# Patient Record
Sex: Female | Born: 1950 | Race: Black or African American | Hispanic: No | Marital: Married | Smoking: Former smoker
Health system: Southern US, Community
[De-identification: ages and names within clinical notes are randomized; demographics above are authoritative.]

## PROBLEM LIST (undated history)

## (undated) DIAGNOSIS — R011 Cardiac murmur, unspecified: Secondary | ICD-10-CM

## (undated) DIAGNOSIS — C189 Malignant neoplasm of colon, unspecified: Secondary | ICD-10-CM

## (undated) DIAGNOSIS — D649 Anemia, unspecified: Secondary | ICD-10-CM

## (undated) DIAGNOSIS — I10 Essential (primary) hypertension: Secondary | ICD-10-CM

## (undated) HISTORY — DX: Cardiac murmur, unspecified: R01.1

## (undated) HISTORY — DX: Anemia, unspecified: D64.9

## (undated) HISTORY — DX: Essential (primary) hypertension: I10

## (undated) HISTORY — PX: ABDOMINAL HYSTERECTOMY: SHX81

## (undated) HISTORY — DX: Malignant neoplasm of colon, unspecified: C18.9

---

## 2021-02-22 ENCOUNTER — Other Ambulatory Visit: Payer: Self-pay

## 2021-02-22 ENCOUNTER — Ambulatory Visit
Admission: RE | Admit: 2021-02-22 | Discharge: 2021-02-22 | Disposition: A | Discharge status: Home / Self Care | Payer: Self-pay | Source: Ambulatory Visit | Attending: Hematology | Admitting: Hematology

## 2021-02-22 ENCOUNTER — Telehealth: Payer: Self-pay | Admitting: Hematology

## 2021-02-22 DIAGNOSIS — C189 Malignant neoplasm of colon, unspecified: Secondary | ICD-10-CM

## 2021-02-22 NOTE — Progress Notes (Signed)
abstraction

## 2021-02-22 NOTE — Telephone Encounter (Signed)
Received a new pt referral from [facility]. Kari Patel has been cld and scheduled to see [name] on 7/20 at 3pm. Pt aware to arrive 20 minutes early.

## 2021-02-24 ENCOUNTER — Other Ambulatory Visit: Payer: Self-pay

## 2021-02-24 ENCOUNTER — Ambulatory Visit
Admission: RE | Admit: 2021-02-24 | Discharge: 2021-02-24 | Disposition: A | Discharge status: Home / Self Care | Payer: Self-pay | Source: Ambulatory Visit | Attending: Hematology | Admitting: Hematology

## 2021-02-24 DIAGNOSIS — C189 Malignant neoplasm of colon, unspecified: Secondary | ICD-10-CM

## 2021-02-24 NOTE — Progress Notes (Signed)
I spoke with radiology at [facility] requesting CT and U/S images be pushed to [employer].  They are able to push them.

## 2021-02-28 ENCOUNTER — Encounter: Payer: Self-pay | Admitting: Hematology

## 2021-03-01 ENCOUNTER — Encounter: Payer: Self-pay | Admitting: Hematology

## 2021-03-01 ENCOUNTER — Inpatient Hospital Stay: Payer: Medicare HMO | Attending: Hematology | Admitting: Hematology

## 2021-03-01 ENCOUNTER — Other Ambulatory Visit: Payer: Self-pay

## 2021-03-01 ENCOUNTER — Inpatient Hospital Stay: Payer: Medicare HMO

## 2021-03-01 VITALS — BP 164/66 | HR 90 | Temp 98.8°F | Resp 18 | Ht 63.5 in | Wt 161.0 lb

## 2021-03-01 DIAGNOSIS — D500 Iron deficiency anemia secondary to blood loss (chronic): Secondary | ICD-10-CM

## 2021-03-01 DIAGNOSIS — Z87891 Personal history of nicotine dependence: Secondary | ICD-10-CM | POA: Insufficient documentation

## 2021-03-01 DIAGNOSIS — R935 Abnormal findings on diagnostic imaging of other abdominal regions, including retroperitoneum: Secondary | ICD-10-CM | POA: Diagnosis not present

## 2021-03-01 DIAGNOSIS — C182 Malignant neoplasm of ascending colon: Secondary | ICD-10-CM | POA: Diagnosis not present

## 2021-03-01 DIAGNOSIS — D649 Anemia, unspecified: Secondary | ICD-10-CM | POA: Insufficient documentation

## 2021-03-01 LAB — CBC WITH DIFFERENTIAL (CANCER CENTER ONLY)
Abs Immature Granulocytes: 0.02 10*3/uL (ref 0.00–0.07)
Basophils Absolute: 0 10*3/uL (ref 0.0–0.1)
Basophils Relative: 1 %
Eosinophils Absolute: 0.2 10*3/uL (ref 0.0–0.5)
Eosinophils Relative: 3 %
HCT: 27.6 % — ABNORMAL LOW (ref 36.0–46.0)
Hemoglobin: 8.1 g/dL — ABNORMAL LOW (ref 12.0–15.0)
Immature Granulocytes: 0 %
Lymphocytes Relative: 16 %
Lymphs Abs: 1 10*3/uL (ref 0.7–4.0)
MCH: 21.5 pg — ABNORMAL LOW (ref 26.0–34.0)
MCHC: 29.3 g/dL — ABNORMAL LOW (ref 30.0–36.0)
MCV: 73.2 fL — ABNORMAL LOW (ref 80.0–100.0)
Monocytes Absolute: 0.5 10*3/uL (ref 0.1–1.0)
Monocytes Relative: 9 %
Neutro Abs: 4.4 10*3/uL (ref 1.7–7.7)
Neutrophils Relative %: 71 %
Platelet Count: 352 10*3/uL (ref 150–400)
RBC: 3.77 MIL/uL — ABNORMAL LOW (ref 3.87–5.11)
RDW: 18.2 % — ABNORMAL HIGH (ref 11.5–15.5)
WBC Count: 6.2 10*3/uL (ref 4.0–10.5)
nRBC: 0 % (ref 0.0–0.2)

## 2021-03-01 LAB — IRON AND TIBC
Iron: 11 ug/dL — ABNORMAL LOW (ref 28–170)
Saturation Ratios: 2 % — ABNORMAL LOW (ref 10.4–31.8)
TIBC: 479 ug/dL — ABNORMAL HIGH (ref 250–450)
UIBC: 468 ug/dL

## 2021-03-01 LAB — FERRITIN: Ferritin: 5 ng/mL — ABNORMAL LOW (ref 11–307)

## 2021-03-01 LAB — CMP (CANCER CENTER ONLY)
ALT: 12 U/L (ref 0–44)
AST: 19 U/L (ref 15–41)
Albumin: 3.9 g/dL (ref 3.5–5.0)
Alkaline Phosphatase: 82 U/L (ref 38–126)
Anion gap: 11 (ref 5–15)
BUN: 8 mg/dL (ref 8–23)
CO2: 23 mmol/L (ref 22–32)
Calcium: 9.4 mg/dL (ref 8.9–10.3)
Chloride: 103 mmol/L (ref 98–111)
Creatinine: 0.6 mg/dL (ref 0.44–1.00)
GFR, Estimated: >60 mL/min (ref >60)
Glucose, Bld: 103 mg/dL — ABNORMAL HIGH (ref 70–99)
Potassium: 3.9 mmol/L (ref 3.5–5.1)
Sodium: 137 mmol/L (ref 135–145)
Total Bilirubin: 0.3 mg/dL (ref 0.3–1.2)
Total Protein: 8.2 g/dL — ABNORMAL HIGH (ref 6.5–8.1)

## 2021-03-01 NOTE — Progress Notes (Signed)
START ON PATHWAY REGIMEN - Colorectal     A cycle is every 14 days:     Bevacizumab-xxxx      Oxaliplatin      Leucovorin      Fluorouracil      Fluorouracil   **Always confirm dose/schedule in your pharmacy ordering system**  Patient Characteristics: Distant Metastases, Nonsurgical Candidate, KRAS/NRAS Mutation Positive/Unknown (BRAF V600 Wild-Type/Unknown), Standard Cytotoxic Therapy, First Line Standard Cytotoxic Therapy, Bevacizumab Eligible, PS = 0,1 Tumor Location: Colon Therapeutic Status: Distant Metastases Microsatellite/Mismatch Repair Status: MSI-H/dMMR BRAF Mutation Status: Wild-Type (no mutation) KRAS/NRAS Mutation Status: Mutation Positive Standard Cytotoxic Line of Therapy: First Line Standard Cytotoxic Therapy ECOG Performance Status: 0 Bevacizumab Eligibility: Eligible Intent of Therapy: Non-Curative / Palliative Intent, Discussed with Patient

## 2021-03-01 NOTE — Progress Notes (Signed)
[facility]   [phone] [phone]) [place] Note   Patient Care Team: [name], MD as Consulting Physician (Oncology) [name], RN as Nurse Navigator (Oncology)  Date of Service:  03/01/2021   CHIEF COMPLAINTS/PURPOSE OF CONSULTATION: Metastatic colon cancer   REFERRING PHYSICIAN:  Self-referral   Oncology History Overview Note  Cancer Staging Cancer of right colon [facility]) Staging form: Colon and Rectum - Neuroendocine Tumors, AJCC 8th Edition - Clinical stage from 12/13/2020: Stage IV (cTX, cN1, cM1) - Signed by [name], MD on 03/01/2021    Cancer of right colon [facility])  12/07/2020 Procedure   Colonoscopy  Impression: - non-bleeding internal hemorrhoids - diverticulosis in entire examined colon - one 9 mm polyp in ascending colon removed with hot snare - one 13 mm polyp in transverse colon removed with hot fnare - likely malignant partially-obstructing tumor in cecum.   12/07/2020 Pathology Results   FINAL PATHOLOGIC DIAGNOSIS  SMALL BOWEL, BIOPSIES - duodenal mucosa within normal limits GASTRIC BODY AND ATRIUM, BIOPSY - gastric antral and body-type mucosa with moderate chronic Helicobacter pylori gastritis ASCENDING COLON, POLYPECTOMY - tubular adenoma with prominent eosinophilic infiltrate CECUM, "MASS," BIOPSIES - moderately differentiated adenocarcinoma TRANSVERSE COLON, POLYPECTOMY - high grade dysplasia involving tubulovillous adenoma - cauterized margins negative   12/13/2020 Cancer Staging   Staging form: Colon and Rectum - Neuroendocine Tumors, AJCC 8th Edition - Clinical stage from 12/13/2020: Stage IV (cTX, cN1, cM1) - Signed by [name], MD on 03/01/2021    12/22/2020 Imaging   CT CAP  IMPRESSION: Large 7.5 cm cecal mass with surrounding nodularity/lymphadenopathy 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 The largest measures 6 mm in the RUL. Findings are nonspecific but can be followed per Fleischner criteria Hiatal hernia   03/01/2021 Initial Diagnosis   Cancer of right colon ([place])       HISTORY OF PRESENTING ILLNESS:  Kari Patel 70 y.o. female is a here because of metastatic colon cancer. The patient was referred by herself. The patient presents to the clinic today alone.   She was initially referred to Dr. Coral Ceo of GI on 09/22/20 following hospitalization for anemia in 08/2020. She was also experiencing abdominal discomfort due to gas, for which her PCP prescribed omeprazole. The recommendation was to proceed with colonoscopy and EGD to rule out occult blood losses. This was performed on 12/07/20, and colonoscopy revealed a fungating and polypoid partially-obstructing large mass found in what is expected to be the cecum. Biopsy of the mass taken at that time confirmed adenocarcinoma. Of note, H. Pylori was also seen and was subsequently treated with Prevpac.   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  She was seen by general surgeon, surgery was not recommended due to the metastatic disease.  Chemotherapy and port placement was recommended by her surgeon, but she did not due to some acute illness around that time.  She has not seen her medical oncologist yet.  She lives in [place] with one of her son, but does have 2 sons in [place].  She decided  get her cancer treatment in [place], but the plan to stay both in [place] and [place].    Today the patient notes she feels well overall. She has mild abdominal bloating, but no pain, no nausea recently, BM normal. She has been taking iron pill some time which can cause constipation. Her nergy is normal. Taking care of her husband who has mild to moderate dementia.  She is a retired Network engineer.  She has a PMHx of HTN, heart murmur, and anemia. She is s/p hysterectomy.  REVIEW OF SYSTEMS:    Constitutional: Denies fevers, chills or abnormal night sweats, no recent weight loss  Eyes: Denies blurriness of vision, double vision or watery eyes Ears, nose, mouth, throat, and face: Denies mucositis or sore throat Respiratory: Denies cough, dyspnea or wheezes Cardiovascular: Denies palpitation, chest discomfort or lower extremity swelling Gastrointestinal: mild bloating. Denies nausea, heartburn or change in bowel habits Skin: Denies abnormal skin rashes Lymphatics: Denies new lymphadenopathy or easy bruising Neurological:Denies numbness, tingling or new weaknesses Behavioral/Psych: Mood is stable, no new changes  All other systems were reviewed with the patient and are negative.   MEDICAL HISTORY:  Past Medical History:  Diagnosis Date   Anemia    Colon cancer ([place])    cecum   Heart murmur    Hypertension     SURGICAL HISTORY: Past Surgical History:  Procedure Laterality Date   ABDOMINAL HYSTERECTOMY      SOCIAL HISTORY: Social History   Socioeconomic History   Marital status: Married    Spouse name: Not on file   Number of children: Not on file   Years of education: Not on file   Highest education level: Not on file  Occupational History   Not on file  Tobacco Use   Smoking status: Former    Packs/day: 0.50    Years: 5.00    Pack years: 2.50    Types: Cigarettes    Quit date: 08/13/1993    Years since quitting: 27.5   Smokeless tobacco: Never  Vaping Use   Vaping Use: Never used  Substance and Sexual Activity   Alcohol use: Not Currently    Comment:  social drinker   Drug use: Never   Sexual activity: Not on file  Other Topics Concern   Not on file  Social History Narrative   Not on file   Social Determinants of Health   Financial Resource Strain: Not on file  Food Insecurity: Not on file  Transportation Needs: Not on file  Physical Activity: Not on file  Stress: Not on file  Social Connections: Not on file  Intimate Partner Violence: Not on file    FAMILY HISTORY: Family History  Problem Relation Age of Onset   Stroke Mother    Hypertension Mother    Stroke Father    Hypertension Father     ALLERGIES:  is allergic to demerol [meperidine hcl].  MEDICATIONS:  Current Outpatient Medications  Medication Sig Dispense Refill   amLODipine (NORVASC) 10 MG tablet Take 10 mg by mouth daily.     ferrous sulfate 325 (65 FE) MG tablet Take 325 mg by mouth 2 (two) times daily with a meal.     lisinopril (ZESTRIL) 5 MG tablet Take 5 mg by mouth daily.     omeprazole (PRILOSEC) 20 MG capsule Take 20 mg by mouth daily.     vitamin C (ASCORBIC ACID) 500 MG tablet Take 500 mg by mouth daily.     No current facility-administered medications for  this visit.    PHYSICAL EXAMINATION: ECOG PERFORMANCE STATUS: 0 - Asymptomatic  Vitals:   03/01/21 1451  BP: (!) 164/66  Pulse: 90  Resp: 18  Temp: 98.8 F (37.1 C)  SpO2: 100%   Filed Weights   03/01/21 1451  Weight: 161 lb (73 kg)    GENERAL:alert, no distress and comfortable SKIN: skin color, texture, turgor are normal, no rashes or significant lesions EYES: normal, Conjunctiva are pink and non-injected, sclera clear NECK: supple, thyroid normal size, non-tender, without nodularity LYMPH:  no palpable lymphadenopathy in the cervical, axillary  LUNGS: clear to auscultation and percussion with normal breathing effort HEART: regular rate & rhythm and no murmurs and no lower extremity edema ABDOMEN:abdomen soft, non-tender and normal bowel sounds Musculoskeletal:no cyanosis  of digits and no clubbing  NEURO: alert & oriented x 3 with fluent speech, no focal motor/sensory deficits  LABORATORY DATA:  I have reviewed the data as listed CBC Latest Ref Rng & Units 03/01/2021  WBC 4.0 - 10.5 K/uL 6.2  Hemoglobin 12.0 - 15.0 g/dL 8.1(L)  Hematocrit 36.0 - 46.0 % 27.6(L)  Platelets 150 - 400 K/uL 352    CMP Latest Ref Rng & Units 03/01/2021  Glucose 70 - 99 mg/dL 103(H)  BUN 8 - 23 mg/dL 8  Creatinine 0.44 - 1.00 mg/dL 0.60  Sodium 135 - 145 mmol/L 137  Potassium 3.5 - 5.1 mmol/L 3.9  Chloride 98 - 111 mmol/L 103  CO2 22 - 32 mmol/L 23  Calcium 8.9 - 10.3 mg/dL 9.4  Total Protein 6.5 - 8.1 g/dL 8.2(H)  Total Bilirubin 0.3 - 1.2 mg/dL 0.3  Alkaline Phos 38 - 126 U/L 82  AST 15 - 41 U/L 19  ALT 0 - 44 U/L 12     RADIOGRAPHIC STUDIES: I have personally reviewed the radiological images as listed and agreed with the findings in the report. No results found.  ASSESSMENT & PLAN:  Kari Patel is a 70 y.o.  female with a history of HTN   Right colon Cancer with probable peritoneal and possible lung metastasis, G2, KRAS G13A mutation(+) -She was initially referred to GI for anemia. Work up with EGD and colonoscopy on 12/07/20 revealed a large cecal mass. Biopsy confirmed adenocarcinoma.  -I reviewed her outside medical records and discussed with patient, her CT scan images are being mailed to [place] but not available for me to review today -Since it has been 2 months from her last CT scan, I recommend repeating CT chest, abdomen pelvis with contrast (she declined oral contrast) in next 1-2 weeks to get a new baseline -I recommend omental biopsy to confirm metastasis -I will also do next generation sequencing such as Foundation One on her biopsy sample, to see if she is a candidate for immunotherapy or other targeted therapy.  She is not a candidate for EGFR inhibitor due to K-ras mutation. -I discussed the natural history of metastatic colon cancer, the incurable  nature of her disease, and the goal of therapy which is palliative to prolong her life and improve her quality of life. -We discussed the option of systemic therapy, such as FOLFOX, Capex, FOLFIRI, or FOLFOXFIRI.  She is 70 year old, but overall in good health, she is agreeable with port placement, will proceed first-line FOLFOX and bevacizumab  -plan to see her back after the scan and biopsy  -plan to start chemo in 2-3 weeks   2. Anemia -likely related to her colon cancer and iron deficiency -We will check iron study  to see if she needs IV iron   PLAN:  -CT chest, abdomen and pelvis with IV contrast, patient declined oral contrast -IR biopsy of omental nodule -IR port placement  -lab today -f/u and chemo class in 2 weeks, please to start chemo FOLFOX and Beva a few days after that    Orders Placed This Encounter  Procedures   CT CHEST ABDOMEN PELVIS W CONTRAST    Standing Status:   Future    Standing Expiration Date:   03/01/2022    Order Specific Question:   If indicated for the ordered procedure, I authorize the administration of contrast media per Radiology protocol    Answer:   Yes    Order Specific Question:   Preferred imaging location?    Answer:   [facility]    Order Specific Question:   Release to patient    Answer:   Immediate    Order Specific Question:   Is Oral Contrast requested for this exam?    Answer:   Yes, Per Radiology protocol    Order Specific Question:   Reason for Exam (SYMPTOM  OR DIAGNOSIS REQUIRED)    Answer:   evaluate cancer status, pt was diagnosed in May 2022, no treatment so far   IR IMAGING GUIDED PORT INSERTION    Standing Status:   Future    Standing Expiration Date:   03/01/2022    Order Specific Question:   Reason for Exam (SYMPTOM  OR DIAGNOSIS REQUIRED)    Answer:   chemo    Order Specific Question:   Preferred Imaging Location?    Answer:   [facility]   CT Biopsy    Standing Status:   Future    Standing Expiration  Date:   03/01/2022    Order Specific Question:   Lab orders requested (DO NOT place separate lab orders, these will be automatically ordered during procedure specimen collection):    Answer:   Surgical Pathology    Order Specific Question:   Reason for Exam (SYMPTOM  OR DIAGNOSIS REQUIRED)    Answer:   confirm metastatic disease    Order Specific Question:   Preferred location?    Answer:   [facility]   CBC with Differential (Cancer Center Only)    Standing Status:   Future    Number of Occurrences:   1    Standing Expiration Date:   03/01/2022   CMP ([place] only)    Standing Status:   Future    Number of Occurrences:   1    Standing Expiration Date:   03/01/2022   Iron and TIBC    Standing Status:   Future    Number of Occurrences:   1    Standing Expiration Date:   03/01/2022   CEA (IN HOUSE-[facility])FOR [facility] ONLY    Standing Status:   Future    Number of Occurrences:   1    Standing Expiration Date:   03/01/2022   Ferritin    Standing Status:   Future    Number of Occurrences:   1    Standing Expiration Date:   03/01/2022     All questions were answered. The patient knows to call the clinic with any problems, questions or concerns. The total time spent in the appointment was 60 minutes.     [name], MD 03/01/2021 6:14 PM  I, Wilburn Mylar, am acting as scribe for [name], MD.   I have  reviewed the above documentation for accuracy and completeness, and I agree with the above.

## 2021-03-01 NOTE — Progress Notes (Signed)
I met with Kari Patel after her consultation with Dr Burr Medico.  I explained my role as nurse navigator and provided my contact information.  I told her that I would call when her Ct scan has been scheduled. Her weight has been stable and she is not having difficulty.  I encouraged her to eat protein based foods as well as fruits and vegetables that she has been eating.  She deferred referral to nutrition at this time.  All questions answered.  Pt verbalized understanding.

## 2021-03-02 LAB — CEA (IN HOUSE-CHCC): CEA (CHCC-In House): 131.71 ng/mL — ABNORMAL HIGH (ref 0.00–5.00)

## 2021-03-03 ENCOUNTER — Encounter (HOSPITAL_COMMUNITY): Payer: Self-pay | Admitting: Radiology

## 2021-03-03 ENCOUNTER — Telehealth: Payer: Self-pay | Admitting: Hematology

## 2021-03-03 NOTE — Telephone Encounter (Signed)
Scheduled appointment per 07/22 sch msg. Patient is aware. 

## 2021-03-03 NOTE — Progress Notes (Signed)
Patient Demographics  Patient Name  Kari Patel, Kari Patel Legal Sex  Female DOB  September 04, 1950 SSN  [id number] Address  [place] [address] Phone  [phone] [facility])  [phone] (Mobile)     RE: CT Biopsy Received: Duaine Dredge, MD  Arlyn Leak   CT core bx omental mass  R/o met    DDH         Previous Messages    ----- Message -----  From: Garth Bigness D  Sent: 03/01/2021   4:26 PM EDT  To: Ir Procedure Requests  Subject: CT Biopsy                                       Procedure:  CT Biopsy   Reason:  Cancer of right colon, confirm metastatic disease   History:  outside imaging in PAC's   Provider:  [name]   Provider Contact:  [phone]

## 2021-03-06 ENCOUNTER — Other Ambulatory Visit: Payer: Self-pay | Admitting: Hematology

## 2021-03-06 ENCOUNTER — Telehealth: Payer: Self-pay | Admitting: Hematology

## 2021-03-06 DIAGNOSIS — D500 Iron deficiency anemia secondary to blood loss (chronic): Secondary | ICD-10-CM

## 2021-03-06 NOTE — Progress Notes (Signed)
I left a vm for Kari Patel letting her know that her iron level is low, we spoke about this when we meet after her consultation with Dr Burr Medico.  I let her know that Dr Burr Medico would like her to received IV iron.  I told her our schedulers will reach out to her.  I provided my direct phone number should she have any questions.

## 2021-03-06 NOTE — Telephone Encounter (Signed)
Scheduled appointment per 07/25 sch msg. Patient is aware. 

## 2021-03-08 ENCOUNTER — Other Ambulatory Visit: Payer: Self-pay

## 2021-03-08 NOTE — Progress Notes (Signed)
The proposed treatment discussed in conference is for discussion purpose only and is not a binding recommendation.  The patients have not been physically examined, or presented with their treatment options.  Therefore, final treatment plans cannot be decided.  

## 2021-03-09 ENCOUNTER — Other Ambulatory Visit: Payer: Self-pay | Admitting: Radiology

## 2021-03-09 NOTE — Progress Notes (Signed)
Pharmacist Chemotherapy Monitoring - Initial Assessment    Anticipated start date: 03/16/21   The following has been reviewed per standard work regarding the patient's treatment regimen: The patient's diagnosis, treatment plan and drug doses, and organ/hematologic function Lab orders and baseline tests specific to treatment regimen  The treatment plan start date, drug sequencing, and pre-medications Prior authorization status  Patient's documented medication list, including drug-drug interaction screen and prescriptions for anti-emetics and supportive care specific to the treatment regimen The drug concentrations, fluid compatibility, administration routes, and timing of the medications to be used The patient's access for treatment and lifetime cumulative dose history, if applicable  The patient's medication allergies and previous infusion related reactions, if applicable   Changes made to treatment plan:  N/A  Follow up needed:  prescriptions needed for anti-emetics, signing treatment plan, and no documented treatment access or plans for port-a-cath placement- port sched for 03/13/21.  Need to determine if Bevacizumab should be held on 03/16/21.   Tora Kindred, [facility], 03/09/2021  3:45 PM

## 2021-03-10 ENCOUNTER — Ambulatory Visit (HOSPITAL_COMMUNITY)
Admission: RE | Admit: 2021-03-10 | Discharge: 2021-03-10 | Disposition: A | Discharge status: Home / Self Care | Payer: Medicare HMO | Source: Ambulatory Visit | Attending: Hematology | Admitting: Hematology

## 2021-03-10 ENCOUNTER — Encounter (HOSPITAL_COMMUNITY): Payer: Self-pay

## 2021-03-10 ENCOUNTER — Other Ambulatory Visit: Payer: Self-pay | Admitting: Radiology

## 2021-03-10 ENCOUNTER — Other Ambulatory Visit: Payer: Self-pay

## 2021-03-10 DIAGNOSIS — C182 Malignant neoplasm of ascending colon: Secondary | ICD-10-CM | POA: Diagnosis not present

## 2021-03-10 MED ORDER — IOHEXOL 350 MG/ML SOLN
80.0000 mL | Freq: Once | INTRAVENOUS | Status: AC | PRN
  Administered 2021-03-10: 75 mL via INTRAVENOUS

## 2021-03-13 ENCOUNTER — Ambulatory Visit (HOSPITAL_COMMUNITY)
Admission: RE | Admit: 2021-03-13 | Discharge: 2021-03-13 | Disposition: A | Discharge status: Home / Self Care | Payer: Medicare HMO | Source: Ambulatory Visit | Attending: Hematology | Admitting: Hematology

## 2021-03-13 ENCOUNTER — Encounter (HOSPITAL_COMMUNITY): Payer: Self-pay

## 2021-03-13 ENCOUNTER — Other Ambulatory Visit: Payer: Self-pay

## 2021-03-13 ENCOUNTER — Inpatient Hospital Stay: Payer: Medicare HMO | Attending: Hematology

## 2021-03-13 VITALS — BP 161/67 | HR 75 | Temp 98.0°F | Resp 16

## 2021-03-13 DIAGNOSIS — N1330 Unspecified hydronephrosis: Secondary | ICD-10-CM | POA: Diagnosis not present

## 2021-03-13 DIAGNOSIS — D509 Iron deficiency anemia, unspecified: Secondary | ICD-10-CM | POA: Diagnosis not present

## 2021-03-13 DIAGNOSIS — K8020 Calculus of gallbladder without cholecystitis without obstruction: Secondary | ICD-10-CM | POA: Diagnosis not present

## 2021-03-13 DIAGNOSIS — C182 Malignant neoplasm of ascending colon: Secondary | ICD-10-CM

## 2021-03-13 DIAGNOSIS — Z885 Allergy status to narcotic agent status: Secondary | ICD-10-CM | POA: Insufficient documentation

## 2021-03-13 DIAGNOSIS — R1909 Other intra-abdominal and pelvic swelling, mass and lump: Secondary | ICD-10-CM | POA: Diagnosis present

## 2021-03-13 DIAGNOSIS — I700 Atherosclerosis of aorta: Secondary | ICD-10-CM | POA: Diagnosis not present

## 2021-03-13 DIAGNOSIS — Z5111 Encounter for antineoplastic chemotherapy: Secondary | ICD-10-CM | POA: Diagnosis present

## 2021-03-13 DIAGNOSIS — Z90710 Acquired absence of both cervix and uterus: Secondary | ICD-10-CM | POA: Insufficient documentation

## 2021-03-13 DIAGNOSIS — Z5112 Encounter for antineoplastic immunotherapy: Secondary | ICD-10-CM | POA: Diagnosis not present

## 2021-03-13 DIAGNOSIS — C786 Secondary malignant neoplasm of retroperitoneum and peritoneum: Secondary | ICD-10-CM | POA: Insufficient documentation

## 2021-03-13 DIAGNOSIS — Z79899 Other long term (current) drug therapy: Secondary | ICD-10-CM | POA: Insufficient documentation

## 2021-03-13 DIAGNOSIS — I10 Essential (primary) hypertension: Secondary | ICD-10-CM | POA: Insufficient documentation

## 2021-03-13 DIAGNOSIS — D500 Iron deficiency anemia secondary to blood loss (chronic): Secondary | ICD-10-CM

## 2021-03-13 DIAGNOSIS — Z87891 Personal history of nicotine dependence: Secondary | ICD-10-CM | POA: Insufficient documentation

## 2021-03-13 DIAGNOSIS — Z452 Encounter for adjustment and management of vascular access device: Secondary | ICD-10-CM | POA: Insufficient documentation

## 2021-03-13 HISTORY — PX: IR IMAGING GUIDED PORT INSERTION: IMG5740

## 2021-03-13 LAB — CBC
HCT: 28.7 % — ABNORMAL LOW (ref 36.0–46.0)
Hemoglobin: 7.9 g/dL — ABNORMAL LOW (ref 12.0–15.0)
MCH: 21 pg — ABNORMAL LOW (ref 26.0–34.0)
MCHC: 27.5 g/dL — ABNORMAL LOW (ref 30.0–36.0)
MCV: 76.1 fL — ABNORMAL LOW (ref 80.0–100.0)
Platelets: 386 10*3/uL (ref 150–400)
RBC: 3.77 MIL/uL — ABNORMAL LOW (ref 3.87–5.11)
RDW: 17.6 % — ABNORMAL HIGH (ref 11.5–15.5)
WBC: 5.2 10*3/uL (ref 4.0–10.5)
nRBC: 0 % (ref 0.0–0.2)

## 2021-03-13 LAB — PROTIME-INR
INR: 1.2 (ref 0.8–1.2)
Prothrombin Time: 15.1 seconds (ref 11.4–15.2)

## 2021-03-13 MED ORDER — FENTANYL CITRATE (PF) 100 MCG/2ML IJ SOLN
INTRAMUSCULAR | Status: AC | PRN
  Administered 2021-03-13: 25 ug via INTRAVENOUS

## 2021-03-13 MED ORDER — MIDAZOLAM HCL 2 MG/2ML IJ SOLN
INTRAMUSCULAR | Status: AC | PRN
  Administered 2021-03-13: 0.5 mg via INTRAVENOUS

## 2021-03-13 MED ORDER — LIDOCAINE HCL (PF) 1 % IJ SOLN
INTRAMUSCULAR | Status: AC | PRN
  Administered 2021-03-13: 10 mL

## 2021-03-13 MED ORDER — SODIUM CHLORIDE 0.9 % IV SOLN: INTRAVENOUS | Status: DC

## 2021-03-13 MED ORDER — SODIUM CHLORIDE 0.9 % IV SOLN
Freq: Once | INTRAVENOUS | Status: AC
  Filled 2021-03-13: qty 250

## 2021-03-13 MED ORDER — FENTANYL CITRATE (PF) 100 MCG/2ML IJ SOLN
INTRAMUSCULAR | Status: AC | PRN
  Administered 2021-03-13: 50 ug via INTRAVENOUS

## 2021-03-13 MED ORDER — LIDOCAINE HCL 1 % IJ SOLN
INTRAMUSCULAR | Status: AC
  Filled 2021-03-13: qty 10

## 2021-03-13 MED ORDER — MIDAZOLAM HCL 2 MG/2ML IJ SOLN
INTRAMUSCULAR | Status: AC
  Filled 2021-03-13: qty 2

## 2021-03-13 MED ORDER — FENTANYL CITRATE (PF) 100 MCG/2ML IJ SOLN
INTRAMUSCULAR | Status: AC
  Filled 2021-03-13: qty 2

## 2021-03-13 MED ORDER — MIDAZOLAM HCL 2 MG/2ML IJ SOLN
INTRAMUSCULAR | Status: AC | PRN
  Administered 2021-03-13: 1 mg via INTRAVENOUS

## 2021-03-13 MED ORDER — SODIUM CHLORIDE 0.9 % IV SOLN
200.0000 mg | Freq: Once | INTRAVENOUS | Status: AC
  Administered 2021-03-13: 200 mg via INTRAVENOUS
  Filled 2021-03-13: qty 200

## 2021-03-13 NOTE — Procedures (Addendum)
Interventional Radiology Procedure Note  Procedure: Placement of a right IJ approach single lumen PowerPort.  Tip is positioned at the superior cavoatrial junction and catheter is ready for immediate use.  Complications: None Recommendations:  - Ok to shower tomorrow - Do not submerge for 7 days - Routine line care  - Now to CT for biopsy.  Maintain NPO  Signed,  Dulcy Fanny. Earleen Newport, DO

## 2021-03-13 NOTE — H&P (Signed)
Chief Complaint: Patient was seen in consultation today for [facility] placement and omental mass biopsy at the request of Feng,Yan  Referring Physician(s): Feng,Yan  Supervising Physician: Corrie Mckusick  Patient Status: [facility] - Out-pt  History of Present Illness: Kari Patel is a 70 y.o. 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 The largest measures 6 mm in the RUL. Findings are nonspecific but can be followed per Fleischner criteria  Request made from Dr Burr Medico for biopsy of omental nodule and Port a cath placement Pt to start chemo treatment 03/15/21    Past Medical History:  Diagnosis Date   Anemia    Colon cancer ([place])    cecum   Heart murmur    Hypertension     Past Surgical History:  Procedure Laterality Date   ABDOMINAL HYSTERECTOMY      Allergies: Demerol [meperidine hcl]  Medications: Prior to Admission medications   Medication Sig Start Date End Date Taking? Authorizing Provider  amLODipine (NORVASC) 10 MG tablet Take 10 mg by mouth daily.    [provider]  lisinopril (ZESTRIL) 5 MG tablet Take 5 mg by mouth daily.    [provider]     Family History  Problem Relation Age of Onset   Stroke Mother    Hypertension Mother    Stroke Father    Hypertension Father     Social History   Socioeconomic History   Marital status: Married    Spouse name: Not on file   Number of children: Not on file   Years of education: Not on  file   Highest education level: Not on file  Occupational History   Not on file  Tobacco Use   Smoking status: Former    Packs/day: 0.50    Years: 5.00    Pack years: 2.50    Types: Cigarettes    Quit date: 08/13/1993    Years since quitting: 27.6   Smokeless tobacco: Never  Vaping Use   Vaping Use: Never used  Substance and Sexual Activity   Alcohol use: Not Currently    Comment: social drinker   Drug use: Never   Sexual activity: Not on file  Other Topics Concern   Not on file  Social History Narrative   Not on file   Social Determinants of Health   Financial Resource Strain: Not on file  Food Insecurity: Not on file  Transportation Needs: Not on file  Physical Activity: Not on file  Stress: Not on file  Social Connections: Not on file    Review of Systems: A 12 point ROS discussed and pertinent positives are indicated in the HPI above.  All other systems are negative.  Review of Systems  Constitutional:  Negative for activity change, fatigue and fever.  Respiratory:  Negative for cough and shortness of breath.   Cardiovascular:  Negative for chest pain.  Gastrointestinal:  Positive for abdominal pain.  Neurological:  Negative for weakness.  Psychiatric/Behavioral:  Negative for behavioral problems and confusion.    Vital Signs: There were no vitals taken for this visit.  Physical Exam HENT:     Mouth/Throat:  Mouth: Mucous membranes are moist.  Cardiovascular:     Rate and Rhythm: Normal rate and regular rhythm.     Heart sounds: Normal heart sounds.  Pulmonary:     Breath sounds: Normal breath sounds.  Abdominal:     Palpations: Abdomen is soft.     Tenderness: There is no abdominal tenderness.  Musculoskeletal:        General: Normal range of motion.  Skin:    General: Skin is warm.  Neurological:     Mental Status: She is alert and oriented to person, place, and time.  Psychiatric:        Behavior: Behavior normal.    Imaging: CT CHEST  ABDOMEN PELVIS W CONTRAST  Result Date: 03/13/2021 CLINICAL DATA:  Colorectal cancer staging, diagnosed May 2022 EXAM: CT CHEST, ABDOMEN, AND PELVIS WITH CONTRAST TECHNIQUE: Multidetector CT imaging of the chest, abdomen and pelvis was performed following the standard protocol during bolus administration of intravenous contrast. CONTRAST:  53m OMNIPAQUE IOHEXOL 350 MG/ML SOLN COMPARISON:  Outside CT Dec 22, 2020. FINDINGS: CT CHEST FINDINGS Cardiovascular: Aortic atherosclerosis without aneurysmal dilation. No central pulmonary embolus. Normal size heart. Coronary artery calcifications. No significant pericardial effusion/thickening. Mediastinum/Nodes: No supraclavicular adenopathy. Hypodense 1 cm nodule in the left lobe of thyroid. Not clinically significant; no follow-up imaging recommended (ref: J Am Coll Radiol. 2015 Feb;12(2): 143-50).Prominent mediastinal and axillary lymph nodes without adenopathy by size criteria. The trachea and esophagus are grossly unremarkable. Small hiatal hernia. Lungs/Pleura: No significant interval change in size or number of the numerous scattered bilateral subcentimeter pulmonary nodules. For reference: -solid 6 mm pulmonary nodule in the right apex on image 30/6, unchanged. -solid 5 mm pulmonary nodule in the paramedian anterior right upper lobe on image 66/6, unchanged -solid 5 mm pulmonary nodule in the right lower lobe on image 76/6, unchanged. -solid 6 mm left lower lobe pulmonary nodule on image 96/6, previously measuring 5 mm. No pleural effusion.  No pneumothorax. Musculoskeletal: Spondylosis. No aggressive lytic or blastic lesion of bone. CT ABDOMEN PELVIS FINDINGS Hepatobiliary: Borderline hepatomegaly measuring 17.4 cm in craniocaudal dimension. The no suspicious hepatic lesion. Layering biliary sludge and stones in the gallbladder, no evidence of acute cholecystitis. No biliary ductal dilation. Pancreas: Within normal limits. Spleen: Within normal limits.  Adrenals/Urinary Tract: Bilateral adrenal glands are unremarkable. Similar mild right-sided hydronephrosis without hydroureter, with the distal ureter traversing peritoneal nodularity on image 92/2. No left-sided hydronephrosis. No solid enhancing renal masses. Urinary bladder is decompressed limiting evaluation. Stomach/Bowel: Small hiatal hernia, otherwise the stomach is grossly unremarkable. No pathologic dilation of small bowel. Extensive colonic diverticulosis without findings of acute diverticulitis. Large infiltrative cecal mass measuring approximately 7.1 x 6.8 x 4.3 cm on image 96/2 and 71/4, with luminal narrowing and involvement of the terminal ileum. Vascular/Lymphatic: Aortic atherosclerosis without aneurysmal dilation. Similar prominent and mildly enlarged retroperitoneal and mesenteric lymph nodes. For reference right common iliac lymph node measuring 12 mm in short axis on image 91/2 and a right pelvic sidewall lymph node measuring 7 mm on image 98/2. Reproductive: Status post hysterectomy. No adnexal masses. Other: Similar extensive peritoneal and omental nodularity throughout the abdomen and pelvis with trace free fluid in the pelvis along the pericolic gutters. Musculoskeletal: Multilevel degenerative changes spine. Degenerative change of the right greater than left hips. No aggressive lytic or blastic lesion of bone visualized. IMPRESSION: 1. Large infiltrative cecal mass measuring approximately 7.1 cm, reflecting the known right-sided colonic neoplasm. 2. Similar extensive peritoneal/omental nodularity throughout the abdomen  and pelvis with trace free fluid in the pelvis along the pericolic gutters, consistent with disease involvement. 3. Similar prominent and mildly enlarged retroperitoneal and mesenteric lymph nodes, most likely reflecting disease involvement. 4. Numerous scattered bilateral pulmonary nodules measuring up to 6 mm are similar to prior and nonspecific but suspicious for disease  involvement. Attention on follow-up imaging suggested. 5. Similar mild right-sided hydronephrosis without hydroureter, with the distal ureter traversing peritoneal nodularity. Likely reflecting partial UPJ obstruction given lack of hydroureter. Attention on follow-up imaging suggested. Cholelithiasis without findings of acute cholecystitis. 6.  Aortic Atherosclerosis (ICD10-I70.0). Electronically Signed   By: Dahlia Bailiff MD   On: 03/13/2021 08:46    Labs:  CBC: Recent Labs    03/01/21 1551  WBC 6.2  HGB 8.1*  HCT 27.6*  PLT 352    COAGS: No results for input(s): INR, APTT in the last 8760 hours.  BMP: Recent Labs    03/01/21 1551  NA 137  K 3.9  CL 103  CO2 23  GLUCOSE 103*  BUN 8  CALCIUM 9.4  CREATININE 0.60  GFRNONAA >60    LIVER FUNCTION TESTS: Recent Labs    03/01/21 1551  BILITOT 0.3  AST 19  ALT 12  ALKPHOS 82  PROT 8.2*  ALBUMIN 3.9    TUMOR MARKERS: No results for input(s): AFPTM, CEA, CA199, CHROMGRNA in the last 8760 hours.  Assessment and Plan:  New dx colorectal cancer Imaging revealing cecal mass; omental nodules; pulmonary nodules Scheduled for omental nodule biopsy and Port a cath placement. Risks and benefits of omental nodule biopsy was discussed with the patient and/or patient's family including, but not limited to bleeding, infection, damage to adjacent structures or low yield requiring additional tests.  Risks and benefits of image guided port-a-catheter placement was discussed with the patient including, but not limited to bleeding, infection, pneumothorax, or fibrin sheath development and need for additional procedures.  All of the patient's questions were answered, patient is agreeable to proceed. Consent signed and in chart.    Thank you for this interesting consult.  I greatly enjoyed meeting Kari Patel and look forward to participating in their care.  A copy of this report was sent to the requesting provider on this  date.  Electronically Signed: Lavonia Drafts, PA-C 03/13/2021, 9:19 AM   I spent a total of  30 Minutes   in face to face in clinical consultation, greater than 50% of which was counseling/coordinating care for [facility] placement and omental nodule biopsy.

## 2021-03-13 NOTE — Patient Instructions (Signed)

## 2021-03-13 NOTE — Procedures (Signed)
Interventional Radiology Procedure Note  Procedure: CT guided biopsy of omental mass, RLQ Complications: None EBL: None Recommendations: - Bedrest 1 hours.   - Routine wound care - Follow up pathology - Advance diet   Signed,  Corrie Mckusick, DO

## 2021-03-14 ENCOUNTER — Other Ambulatory Visit: Payer: Self-pay

## 2021-03-14 DIAGNOSIS — C189 Malignant neoplasm of colon, unspecified: Secondary | ICD-10-CM

## 2021-03-14 DIAGNOSIS — Z95828 Presence of other vascular implants and grafts: Secondary | ICD-10-CM

## 2021-03-14 MED ORDER — PROCHLORPERAZINE MALEATE 10 MG PO TABS
10.0000 mg | ORAL_TABLET | Freq: Four times a day (QID) | ORAL | 1 refills | Status: DC | PRN
Start: 2021-03-14 — End: 2021-12-12

## 2021-03-14 MED ORDER — LIDOCAINE-PRILOCAINE 2.5-2.5 % EX CREA: 1.0000 "application " | TOPICAL_CREAM | CUTANEOUS | 0 refills | Status: DC | PRN

## 2021-03-14 MED ORDER — ONDANSETRON HCL 8 MG PO TABS
8.0000 mg | ORAL_TABLET | Freq: Three times a day (TID) | ORAL | 1 refills | Status: DC | PRN
Start: 2021-03-14 — End: 2021-11-24

## 2021-03-15 ENCOUNTER — Other Ambulatory Visit: Payer: Self-pay

## 2021-03-15 ENCOUNTER — Encounter: Payer: Self-pay | Admitting: Hematology

## 2021-03-15 ENCOUNTER — Inpatient Hospital Stay: Payer: Medicare HMO

## 2021-03-15 ENCOUNTER — Inpatient Hospital Stay (HOSPITAL_BASED_OUTPATIENT_CLINIC_OR_DEPARTMENT_OTHER): Payer: Medicare HMO | Admitting: Hematology

## 2021-03-15 VITALS — BP 192/77 | HR 77 | Temp 98.2°F | Resp 18 | Ht 63.0 in | Wt 160.9 lb

## 2021-03-15 DIAGNOSIS — Z95828 Presence of other vascular implants and grafts: Secondary | ICD-10-CM

## 2021-03-15 DIAGNOSIS — C182 Malignant neoplasm of ascending colon: Secondary | ICD-10-CM

## 2021-03-15 DIAGNOSIS — Z5112 Encounter for antineoplastic immunotherapy: Secondary | ICD-10-CM | POA: Diagnosis not present

## 2021-03-15 LAB — CMP (CANCER CENTER ONLY)
ALT: 7 U/L (ref 0–44)
AST: 15 U/L (ref 15–41)
Albumin: 3.3 g/dL — ABNORMAL LOW (ref 3.5–5.0)
Alkaline Phosphatase: 74 U/L (ref 38–126)
Anion gap: 9 (ref 5–15)
BUN: 6 mg/dL — ABNORMAL LOW (ref 8–23)
CO2: 23 mmol/L (ref 22–32)
Calcium: 9.5 mg/dL (ref 8.9–10.3)
Chloride: 108 mmol/L (ref 98–111)
Creatinine: 0.63 mg/dL (ref 0.44–1.00)
GFR, Estimated: >60 mL/min (ref >60)
Glucose, Bld: 89 mg/dL (ref 70–99)
Potassium: 3.9 mmol/L (ref 3.5–5.1)
Sodium: 140 mmol/L (ref 135–145)
Total Bilirubin: <0.2 mg/dL — ABNORMAL LOW (ref 0.3–1.2)
Total Protein: 7.5 g/dL (ref 6.5–8.1)

## 2021-03-15 LAB — TOTAL PROTEIN, URINE DIPSTICK: Protein, ur: <30 mg/dL — AB

## 2021-03-15 LAB — CBC WITH DIFFERENTIAL (CANCER CENTER ONLY)
Abs Immature Granulocytes: 0.01 10*3/uL (ref 0.00–0.07)
Basophils Absolute: 0 10*3/uL (ref 0.0–0.1)
Basophils Relative: 1 %
Eosinophils Absolute: 0.2 10*3/uL (ref 0.0–0.5)
Eosinophils Relative: 4 %
HCT: 24 % — ABNORMAL LOW (ref 36.0–46.0)
Hemoglobin: 7.1 g/dL — ABNORMAL LOW (ref 12.0–15.0)
Immature Granulocytes: 0 %
Lymphocytes Relative: 19 %
Lymphs Abs: 1 10*3/uL (ref 0.7–4.0)
MCH: 21.7 pg — ABNORMAL LOW (ref 26.0–34.0)
MCHC: 29.6 g/dL — ABNORMAL LOW (ref 30.0–36.0)
MCV: 73.4 fL — ABNORMAL LOW (ref 80.0–100.0)
Monocytes Absolute: 0.6 10*3/uL (ref 0.1–1.0)
Monocytes Relative: 11 %
Neutro Abs: 3.5 10*3/uL (ref 1.7–7.7)
Neutrophils Relative %: 65 %
Platelet Count: 325 10*3/uL (ref 150–400)
RBC: 3.27 MIL/uL — ABNORMAL LOW (ref 3.87–5.11)
RDW: 17.6 % — ABNORMAL HIGH (ref 11.5–15.5)
WBC Count: 5.3 10*3/uL (ref 4.0–10.5)
nRBC: 0 % (ref 0.0–0.2)

## 2021-03-15 MED ORDER — SODIUM CHLORIDE 0.9% FLUSH
10.0000 mL | INTRAVENOUS | Status: AC | PRN
  Administered 2021-03-15: 10 mL
  Filled 2021-03-15: qty 10

## 2021-03-15 MED ORDER — HEPARIN SOD (PORK) LOCK FLUSH 100 UNIT/ML IV SOLN
500.0000 [IU] | INTRAVENOUS | Status: AC | PRN
  Administered 2021-03-15: 500 [IU]
  Filled 2021-03-15: qty 5

## 2021-03-15 NOTE — Progress Notes (Signed)
[facility]   Telephone:([phone]) [phone] Fax:(336) [phone]   Clinic Follow up Note   Patient Care Team: Pcp, No as PCP - General [name], MD as Consulting Physician (Oncology) [name], RN as Nurse Navigator (Oncology)  Date of Service:  03/15/2021  CHIEF COMPLAINT: f/u of metastatic colon cancer  SUMMARY OF ONCOLOGIC HISTORY: Oncology History Overview Note  Cancer Staging Cancer of right colon [facility]) Staging form: Colon and Rectum - Neuroendocine Tumors, AJCC 8th Edition - Clinical stage from 12/13/2020: Stage IV (cTX, cN1, cM1) - Signed by [name], MD on 03/01/2021    Cancer of right colon [facility])  12/07/2020 Procedure   Colonoscopy  Impression: - non-bleeding internal hemorrhoids - diverticulosis in entire examined colon - one 9 mm polyp in ascending colon removed with hot snare - one 13 mm polyp in transverse colon removed with hot fnare - likely malignant partially-obstructing tumor in cecum.   12/07/2020 Pathology Results   FINAL PATHOLOGIC DIAGNOSIS  SMALL BOWEL, BIOPSIES - duodenal mucosa within normal limits GASTRIC BODY AND ATRIUM, BIOPSY - gastric antral and body-type mucosa with moderate chronic Helicobacter pylori gastritis ASCENDING COLON, POLYPECTOMY - tubular adenoma with prominent eosinophilic infiltrate CECUM, "MASS," BIOPSIES - moderately differentiated adenocarcinoma TRANSVERSE COLON, POLYPECTOMY - high grade dysplasia involving tubulovillous adenoma - cauterized margins negative   12/13/2020 Cancer Staging   Staging form: Colon and Rectum - Neuroendocine Tumors, AJCC 8th Edition - Clinical stage from 12/13/2020: Stage IV (cTX, cN1, cM1) - Signed by [name], MD on 03/01/2021    12/22/2020 Imaging   CT CAP  IMPRESSION: Large 7.5 cm cecal mass with surrounding nodularity/lymphadenopathy 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 The largest measures 6 mm in the RUL. Findings are nonspecific but can be followed per Fleischner criteria Hiatal hernia   03/01/2021 Initial Diagnosis   Cancer of right colon ([place])    03/10/2021 Imaging   CT CAP  IMPRESSION: 1. Large infiltrative cecal mass measuring approximately 7.1 cm, reflecting the known right-sided colonic neoplasm. 2. Similar extensive peritoneal/omental nodularity throughout the abdomen and pelvis with trace free fluid in the pelvis along the pericolic gutters, consistent with disease involvement. 3. Similar prominent and mildly enlarged retroperitoneal and mesenteric lymph nodes, most likely reflecting disease involvement. 4. Numerous scattered bilateral pulmonary nodules measuring up to 6 mm are similar to prior and nonspecific but suspicious for disease involvement. Attention on follow-up imaging suggested. 5. Similar mild right-sided hydronephrosis without hydroureter, with the distal ureter traversing peritoneal nodularity. Likely reflecting partial UPJ obstruction given lack of hydroureter. Attention on follow-up imaging suggested. Cholelithiasis without findings of acute cholecystitis. 6.  Aortic Atherosclerosis (ICD10-I70.0).   03/13/2021 Pathology Results   FINAL MICROSCOPIC DIAGNOSIS:   A. OMENTUM, NEEDLE CORE BIOPSY:  - Metastatic adenocarcinoma, consistent with clinical impression of a  colonic primary.  See comment    03/16/2021 -  Chemotherapy    Patient is on Treatment Plan: COLORECTAL FOLFOX + BEVACIZUMAB Q14D          CURRENT THERAPY:  First-line FOLFOX and bevacizumab, starting 03/16/21  INTERVAL HISTORY:  Kari Patel is here for a follow up of metastatic colon cancer. She was last seen by me on 03/01/21 in consultation. She presents to the  clinic alone. She reports she had some pain from biopsy but not  much. Her BP is high today. She notes she forgot to take her medication this morning. She notes she will stay in [facility] for however long she needs to for treatment. She lives in [place], which is about 1 hr 40 min away.   All other systems were reviewed with the patient and are negative.  MEDICAL HISTORY:  Past Medical History:  Diagnosis Date   Anemia    Colon cancer ([place])    cecum   Heart murmur    Hypertension     SURGICAL HISTORY: Past Surgical History:  Procedure Laterality Date   ABDOMINAL HYSTERECTOMY     IR IMAGING GUIDED PORT INSERTION  03/13/2021    I have reviewed the social history and family history with the patient and they are unchanged from previous note.  ALLERGIES:  is allergic to demerol [meperidine hcl].  MEDICATIONS:  Current Outpatient Medications  Medication Sig Dispense Refill   lidocaine-prilocaine (EMLA) cream Apply 1 application topically as needed. 30 g 0   ondansetron (ZOFRAN) 8 MG tablet Take 1 tablet (8 mg total) by mouth every 8 (eight) hours as needed for nausea or vomiting. 20 tablet 1   prochlorperazine (COMPAZINE) 10 MG tablet Take 1 tablet (10 mg total) by mouth every 6 (six) hours as needed for nausea or vomiting. 30 tablet 1   amLODipine (NORVASC) 10 MG tablet Take 10 mg by mouth daily.     lisinopril (ZESTRIL) 5 MG tablet Take 5 mg by mouth daily.     No current facility-administered medications for this visit.    PHYSICAL EXAMINATION: ECOG PERFORMANCE STATUS: 1 - Symptomatic but completely ambulatory  Vitals:   03/15/21 1133  BP: (!) 192/77  Pulse: 77  Resp: 18  Temp: 98.2 F (36.8 C)  SpO2: 100%   Wt Readings from Last 3 Encounters:  03/15/21 160 lb 14.4 oz (73 kg)  03/13/21 160 lb 15 oz (73 kg)  03/01/21 161 lb (73 kg)     GENERAL:alert, no distress and comfortable SKIN: skin color normal, no rashes or significant lesions EYES: normal, Conjunctiva  are pink and non-injected, sclera clear  NEURO: alert & oriented x 3 with fluent speech  LABORATORY DATA:  I have reviewed the data as listed CBC Latest Ref Rng & Units 03/15/2021 03/13/2021 03/01/2021  WBC 4.0 - 10.5 K/uL 5.3 5.2 6.2  Hemoglobin 12.0 - 15.0 g/dL 7.1(L) 7.9(L) 8.1(L)  Hematocrit 36.0 - 46.0 % 24.0(L) 28.7(L) 27.6(L)  Platelets 150 - 400 K/uL 325 386 352     CMP Latest Ref Rng & Units 03/15/2021 03/01/2021  Glucose 70 - 99 mg/dL 89 103(H)  BUN 8 - 23 mg/dL 6(L) 8  Creatinine 0.44 - 1.00 mg/dL 0.63 0.60  Sodium 135 - 145 mmol/L 140 137  Potassium 3.5 - 5.1 mmol/L 3.9 3.9  Chloride 98 - 111 mmol/L 108 103  CO2 22 - 32 mmol/L 23 23  Calcium 8.9 - 10.3 mg/dL 9.5 9.4  Total Protein 6.5 - 8.1 g/dL 7.5 8.2(H)  Total Bilirubin 0.3 - 1.2 mg/dL <0.2(L) 0.3  Alkaline Phos 38 - 126 U/L 74 82  AST 15 - 41 U/L 15 19  ALT 0 - 44 U/L 7 12      RADIOGRAPHIC STUDIES: I have personally reviewed the radiological images as listed and agreed with the findings in the report. No results found.   ASSESSMENT & PLAN:  Kari Patel is a 70 y.o. female with   1. 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 Work up with EGD and colonoscopy on 12/07/20 revealed a large cecal mass. Biopsy confirmed adenocarcinoma.  -She underwent repeat CT CAP on 03/10/21 showing: 7.1 cm cecal mass; similar extensive peritoneal/omental nodularity; similar prominent and mildly enlarged retroperitoneal and mesenteric lymph nodes; numerous scattered bilateral pulmonary nodules; similar mild right-sided hydronephrosis. -She proceeded to biopsy of an omental nodule on 03/13/21 confirming metastatic adenocarcinoma, consistent with colon primary.  I discussed with her today. -Repeat CT scan from March 13, 2021 was reviewed in person, and discussed with patient. -port placed on 03/13/21, chemo education class today -She is now ready to  begin first-line FOLFOX and bevacizumab tomorrow, 03/16/21.  --Chemotherapy consent: Side effects including but does not not limited to, fatigue, nausea, vomiting, diarrhea, hair loss, cold sensitivity and neuropathy, fluid retention, renal and kidney dysfunction, neutropenic fever, needed for blood transfusion, bleeding, were discussed with patient in great detail. She agrees to proceed. -She understands the goal of therapy is palliative, to prolong her life -Follow-up in 2 weeks before cycle 2. -I have requested [facility] next generation sequencing on her original biopsy sample   2. Anemia, iron deficiency  -likely related to her colon cancer and iron deficiency -She received IV iron Venofer 200 mg on 03/13/21. -She still has moderate anemia, will continue Venofer 200 mg tomorrow, and 300 mg weekly for the next 3 weeks.   3.  Goal of care discussion  -We again discussed the incurable nature of her cancer, and the overall poor prognosis, especially if she does not have good response to chemotherapy or progress on chemo -The patient understands the goal of care is palliative. -she is full code now     PLAN:  -proceed with chemo education class today -proceed with C1 FOLFOX and Beva tomorrow, and additional Venofer 200 mg 03/16/21, no GCSF -lab and IV Venofer 300 mg every Monday for the next 3 weeks  -lab, flush, f/u, and C2 FOLFOX and Beva on 03/30/21 and C3 on 04/13/21 -FO requested today     No problem-specific Assessment & Plan notes found for this encounter.   No orders of the defined types were placed in this encounter.  All questions were answered. The patient knows to call the clinic with any problems, questions or concerns. No barriers to learning was detected. The total time spent in the appointment was 30 minutes.     [name], MD 03/15/2021   I, Wilburn Mylar, am acting as scribe for [name], MD.   I have reviewed the above documentation for accuracy and  completeness, and I agree with the above.

## 2021-03-16 ENCOUNTER — Telehealth: Payer: Self-pay | Admitting: Hematology

## 2021-03-16 ENCOUNTER — Inpatient Hospital Stay: Payer: Medicare HMO

## 2021-03-16 VITALS — BP 127/46 | HR 66 | Temp 98.8°F | Resp 18 | Ht 63.0 in | Wt 160.2 lb

## 2021-03-16 DIAGNOSIS — Z5112 Encounter for antineoplastic immunotherapy: Secondary | ICD-10-CM | POA: Diagnosis not present

## 2021-03-16 DIAGNOSIS — D500 Iron deficiency anemia secondary to blood loss (chronic): Secondary | ICD-10-CM

## 2021-03-16 DIAGNOSIS — C182 Malignant neoplasm of ascending colon: Secondary | ICD-10-CM

## 2021-03-16 LAB — CEA (IN HOUSE-CHCC): CEA (CHCC-In House): 124.03 ng/mL — ABNORMAL HIGH (ref 0.00–5.00)

## 2021-03-16 MED ORDER — SODIUM CHLORIDE 0.9 % IV SOLN: 5.0000 mg/kg | Freq: Once | INTRAVENOUS | Status: DC

## 2021-03-16 MED ORDER — SODIUM CHLORIDE 0.9% FLUSH
10.0000 mL | INTRAVENOUS | Status: DC | PRN
  Filled 2021-03-16: qty 10

## 2021-03-16 MED ORDER — PALONOSETRON HCL INJECTION 0.25 MG/5ML
INTRAVENOUS | Status: AC
  Filled 2021-03-16: qty 5

## 2021-03-16 MED ORDER — DEXTROSE 5 % IV SOLN
Freq: Once | INTRAVENOUS | Status: AC
  Filled 2021-03-16: qty 250

## 2021-03-16 MED ORDER — SODIUM CHLORIDE 0.9 % IV SOLN
10.0000 mg | Freq: Once | INTRAVENOUS | Status: AC
  Administered 2021-03-16: 10 mg via INTRAVENOUS
  Filled 2021-03-16: qty 10

## 2021-03-16 MED ORDER — HEPARIN SOD (PORK) LOCK FLUSH 100 UNIT/ML IV SOLN
500.0000 [IU] | Freq: Once | INTRAVENOUS | Status: DC | PRN
  Filled 2021-03-16: qty 5

## 2021-03-16 MED ORDER — PALONOSETRON HCL INJECTION 0.25 MG/5ML
0.2500 mg | Freq: Once | INTRAVENOUS | Status: AC
  Administered 2021-03-16: 0.25 mg via INTRAVENOUS

## 2021-03-16 MED ORDER — SODIUM CHLORIDE 0.9 % IV SOLN
2400.0000 mg/m2 | INTRAVENOUS | Status: DC
  Administered 2021-03-16: 4350 mg via INTRAVENOUS
  Filled 2021-03-16: qty 87

## 2021-03-16 MED ORDER — LEUCOVORIN CALCIUM INJECTION 350 MG
400.0000 mg/m2 | Freq: Once | INTRAVENOUS | Status: AC
  Administered 2021-03-16: 724 mg via INTRAVENOUS
  Filled 2021-03-16: qty 36.2

## 2021-03-16 MED ORDER — OXALIPLATIN CHEMO INJECTION 100 MG/20ML
84.0000 mg/m2 | Freq: Once | INTRAVENOUS | Status: AC
  Administered 2021-03-16: 150 mg via INTRAVENOUS
  Filled 2021-03-16: qty 20

## 2021-03-16 MED ORDER — SODIUM CHLORIDE 0.9 % IV SOLN
200.0000 mg | Freq: Once | INTRAVENOUS | Status: AC
  Administered 2021-03-16: 200 mg via INTRAVENOUS
  Filled 2021-03-16: qty 200

## 2021-03-16 MED ORDER — SODIUM CHLORIDE 0.9 % IV SOLN: 300.0000 mg | Freq: Once | INTRAVENOUS | Status: DC

## 2021-03-16 MED ORDER — SODIUM CHLORIDE 0.9 % IV SOLN
Freq: Once | INTRAVENOUS | Status: AC
  Filled 2021-03-16: qty 250

## 2021-03-16 NOTE — Progress Notes (Signed)
hold Bevacizumab today per [name]; port placed 03/13/21.  Kennith Center, Pharm.D., CPP 03/16/2021'@11'$ :16 AM

## 2021-03-16 NOTE — Patient Instructions (Addendum)
[facility]  Discharge Instructions: Thank you for choosing [facility] to provide your oncology and hematology care.   If you have a lab appointment with the [facility], please go directly to the [facility] and check in at the registration area.   Wear comfortable clothing and clothing appropriate for easy access to any Portacath or PICC line.   We strive to give you quality time with your provider. You may need to reschedule your appointment if you arrive late (15 or more minutes).  Arriving late affects you and other patients whose appointments are after yours.  Also, if you miss three or more appointments without notifying the office, you may be dismissed from the clinic at the provider's discretion.      For prescription refill requests, have your pharmacy contact our office and allow 72 hours for refills to be completed.    Today you received the following chemotherapy and/or immunotherapy agents Oxaliplatin, Leucovorin Fluorouracil, iron.      To help prevent nausea and vomiting after your treatment, we encourage you to take your nausea medication as directed.  BELOW ARE SYMPTOMS THAT SHOULD BE REPORTED IMMEDIATELY: *FEVER GREATER THAN 100.4 F (38 C) OR HIGHER *CHILLS OR SWEATING *NAUSEA AND VOMITING THAT IS NOT CONTROLLED WITH YOUR NAUSEA MEDICATION *UNUSUAL SHORTNESS OF BREATH *UNUSUAL BRUISING OR BLEEDING *URINARY PROBLEMS (pain or burning when urinating, or frequent urination) *BOWEL PROBLEMS (unusual diarrhea, constipation, pain near the anus) TENDERNESS IN MOUTH AND THROAT WITH OR WITHOUT PRESENCE OF ULCERS (sore throat, sores in mouth, or a toothache) UNUSUAL RASH, SWELLING OR PAIN  UNUSUAL VAGINAL DISCHARGE OR ITCHING   Items with * indicate a potential emergency and should be followed up as soon as possible or go to the Emergency Department if any problems should occur.  Please show the CHEMOTHERAPY ALERT CARD or  IMMUNOTHERAPY ALERT CARD at check-in to the Emergency Department and triage nurse.  Should you have questions after your visit or need to cancel or reschedule your appointment, please contact [facility]  Dept: [phone]  and follow the prompts.  Office hours are 8:00 a.m. to 4:30 p.m. Monday - Friday. Please note that voicemails left after 4:00 p.m. may not be returned until the following business day.  We are closed weekends and major holidays. You have access to a nurse at all times for urgent questions. Please call the main number to the clinic Dept: [phone] and follow the prompts.   For any non-urgent questions, you may also contact your provider using MyChart. We now offer e-Visits for anyone 56 and older to request care online for non-urgent symptoms. For details visit [email or web address].   Also download the MyChart app! Go to the app store, search "MyChart", open the app, select [facility], and log in with your MyChart username and password.  Due to Covid, a mask is required upon entering the hospital/clinic. If you do not have a mask, one will be given to you upon arrival. For doctor visits, patients may have 71 support person aged 71 or older with them. For treatment visits, patients cannot have anyone with them due to current Covid guidelines and our immunocompromised population.

## 2021-03-16 NOTE — Progress Notes (Signed)
HGB 7.1 noted. Per [name]:    "OK to treat, she is getting iv iron, will check CBC again next week.  will transfuse her next week if no improvement."

## 2021-03-16 NOTE — Telephone Encounter (Signed)
Left message with follow-up appointments per 8/3 los. 

## 2021-03-17 LAB — SURGICAL PATHOLOGY

## 2021-03-18 ENCOUNTER — Inpatient Hospital Stay: Payer: Medicare HMO

## 2021-03-18 ENCOUNTER — Other Ambulatory Visit: Payer: Self-pay

## 2021-03-18 VITALS — BP 163/70 | HR 96 | Temp 99.6°F | Resp 18

## 2021-03-18 DIAGNOSIS — C182 Malignant neoplasm of ascending colon: Secondary | ICD-10-CM

## 2021-03-18 DIAGNOSIS — Z5112 Encounter for antineoplastic immunotherapy: Secondary | ICD-10-CM | POA: Diagnosis not present

## 2021-03-18 MED ORDER — HEPARIN SOD (PORK) LOCK FLUSH 100 UNIT/ML IV SOLN
500.0000 [IU] | Freq: Once | INTRAVENOUS | Status: AC | PRN
  Administered 2021-03-18: 500 [IU]
  Filled 2021-03-18: qty 5

## 2021-03-18 MED ORDER — SODIUM CHLORIDE 0.9% FLUSH
10.0000 mL | INTRAVENOUS | Status: DC | PRN
  Administered 2021-03-18: 10 mL
  Filled 2021-03-18: qty 10

## 2021-03-20 ENCOUNTER — Inpatient Hospital Stay: Payer: Medicare HMO

## 2021-03-20 ENCOUNTER — Other Ambulatory Visit: Payer: Self-pay

## 2021-03-20 VITALS — BP 108/52 | HR 67 | Temp 98.3°F | Resp 18

## 2021-03-20 DIAGNOSIS — Z5112 Encounter for antineoplastic immunotherapy: Secondary | ICD-10-CM | POA: Diagnosis not present

## 2021-03-20 DIAGNOSIS — D500 Iron deficiency anemia secondary to blood loss (chronic): Secondary | ICD-10-CM

## 2021-03-20 DIAGNOSIS — C182 Malignant neoplasm of ascending colon: Secondary | ICD-10-CM

## 2021-03-20 DIAGNOSIS — Z95828 Presence of other vascular implants and grafts: Secondary | ICD-10-CM

## 2021-03-20 LAB — CBC WITH DIFFERENTIAL (CANCER CENTER ONLY)
Abs Immature Granulocytes: 0.02 10*3/uL (ref 0.00–0.07)
Basophils Absolute: 0 10*3/uL (ref 0.0–0.1)
Basophils Relative: 0 %
Eosinophils Absolute: 0.3 10*3/uL (ref 0.0–0.5)
Eosinophils Relative: 6 %
HCT: 24.9 % — ABNORMAL LOW (ref 36.0–46.0)
Hemoglobin: 7.4 g/dL — ABNORMAL LOW (ref 12.0–15.0)
Immature Granulocytes: 0 %
Lymphocytes Relative: 18 %
Lymphs Abs: 0.8 10*3/uL (ref 0.7–4.0)
MCH: 21.9 pg — ABNORMAL LOW (ref 26.0–34.0)
MCHC: 29.7 g/dL — ABNORMAL LOW (ref 30.0–36.0)
MCV: 73.7 fL — ABNORMAL LOW (ref 80.0–100.0)
Monocytes Absolute: 0.1 10*3/uL (ref 0.1–1.0)
Monocytes Relative: 2 %
Neutro Abs: 3.4 10*3/uL (ref 1.7–7.7)
Neutrophils Relative %: 74 %
Platelet Count: 293 10*3/uL (ref 150–400)
RBC: 3.38 MIL/uL — ABNORMAL LOW (ref 3.87–5.11)
RDW: 18.7 % — ABNORMAL HIGH (ref 11.5–15.5)
WBC Count: 4.6 10*3/uL (ref 4.0–10.5)
nRBC: 0 % (ref 0.0–0.2)

## 2021-03-20 LAB — CMP (CANCER CENTER ONLY)
ALT: 14 U/L (ref 0–44)
AST: 21 U/L (ref 15–41)
Albumin: 3.3 g/dL — ABNORMAL LOW (ref 3.5–5.0)
Alkaline Phosphatase: 70 U/L (ref 38–126)
Anion gap: 11 (ref 5–15)
BUN: 6 mg/dL — ABNORMAL LOW (ref 8–23)
CO2: 24 mmol/L (ref 22–32)
Calcium: 9.3 mg/dL (ref 8.9–10.3)
Chloride: 103 mmol/L (ref 98–111)
Creatinine: 0.65 mg/dL (ref 0.44–1.00)
GFR, Estimated: >60 mL/min (ref >60)
Glucose, Bld: 117 mg/dL — ABNORMAL HIGH (ref 70–99)
Potassium: 3.4 mmol/L — ABNORMAL LOW (ref 3.5–5.1)
Sodium: 138 mmol/L (ref 135–145)
Total Bilirubin: 0.4 mg/dL (ref 0.3–1.2)
Total Protein: 7.5 g/dL (ref 6.5–8.1)

## 2021-03-20 LAB — TOTAL PROTEIN, URINE DIPSTICK: Protein, ur: 30 mg/dL — AB

## 2021-03-20 MED ORDER — HEPARIN SOD (PORK) LOCK FLUSH 100 UNIT/ML IV SOLN
500.0000 [IU] | Freq: Once | INTRAVENOUS | Status: AC | PRN
  Administered 2021-03-20: 500 [IU]
  Filled 2021-03-20: qty 5

## 2021-03-20 MED ORDER — SODIUM CHLORIDE 0.9% FLUSH
10.0000 mL | INTRAVENOUS | Status: AC | PRN
  Administered 2021-03-20: 10 mL
  Filled 2021-03-20: qty 10

## 2021-03-20 MED ORDER — SODIUM CHLORIDE 0.9 % IV SOLN
300.0000 mg | Freq: Once | INTRAVENOUS | Status: AC
  Administered 2021-03-20: 300 mg via INTRAVENOUS
  Filled 2021-03-20: qty 300

## 2021-03-20 MED ORDER — SODIUM CHLORIDE 0.9 % IV SOLN
Freq: Once | INTRAVENOUS | Status: AC
  Filled 2021-03-20: qty 250

## 2021-03-20 MED ORDER — SODIUM CHLORIDE 0.9% FLUSH
10.0000 mL | Freq: Once | INTRAVENOUS | Status: AC | PRN
  Administered 2021-03-20: 10 mL
  Filled 2021-03-20: qty 10

## 2021-03-20 NOTE — Progress Notes (Signed)
Pt tolerated iron infusion well. Discharged in stable condition after 30 min observation. All questions answered, VSS

## 2021-03-20 NOTE — Patient Instructions (Signed)

## 2021-03-22 ENCOUNTER — Telehealth: Payer: Self-pay | Admitting: *Deleted

## 2021-03-22 NOTE — Telephone Encounter (Signed)
Received vm call from pt.  Returned call & pt states she is having some nausea & diarrhea.  She took compazine & zofran this am & nausea is better.  She thought that these would help diarrhea also.  Informed that she needs to get some imodium & if that doesn't help to call back.  She expressed understanding.

## 2021-03-27 ENCOUNTER — Other Ambulatory Visit: Payer: Self-pay

## 2021-03-27 ENCOUNTER — Inpatient Hospital Stay: Payer: Medicare HMO

## 2021-03-27 VITALS — BP 133/57 | HR 69 | Temp 98.7°F | Resp 16

## 2021-03-27 DIAGNOSIS — Z5112 Encounter for antineoplastic immunotherapy: Secondary | ICD-10-CM | POA: Diagnosis not present

## 2021-03-27 DIAGNOSIS — D500 Iron deficiency anemia secondary to blood loss (chronic): Secondary | ICD-10-CM

## 2021-03-27 MED ORDER — SODIUM CHLORIDE 0.9% FLUSH
10.0000 mL | Freq: Once | INTRAVENOUS | Status: AC | PRN
  Administered 2021-03-27: 10 mL

## 2021-03-27 MED ORDER — HEPARIN SOD (PORK) LOCK FLUSH 100 UNIT/ML IV SOLN
500.0000 [IU] | Freq: Once | INTRAVENOUS | Status: AC | PRN
  Administered 2021-03-27: 500 [IU]

## 2021-03-27 MED ORDER — SODIUM CHLORIDE 0.9 % IV SOLN
300.0000 mg | Freq: Once | INTRAVENOUS | Status: AC
  Administered 2021-03-27: 300 mg via INTRAVENOUS
  Filled 2021-03-27: qty 300

## 2021-03-27 MED ORDER — SODIUM CHLORIDE 0.9 % IV SOLN: Freq: Once | INTRAVENOUS | Status: AC

## 2021-03-27 NOTE — Progress Notes (Signed)
Patient was observed for 30 minutes post iron infusion with no adverse reaction. Vitals stable and patient in no distress upon leaving infusion room.

## 2021-03-27 NOTE — Patient Instructions (Addendum)

## 2021-03-28 ENCOUNTER — Encounter (HOSPITAL_COMMUNITY): Payer: Self-pay

## 2021-03-28 MED FILL — Dexamethasone Sodium Phosphate Inj 100 MG/10ML: INTRAMUSCULAR | Qty: 1 | Status: AC

## 2021-03-28 NOTE — Progress Notes (Signed)
Kari Patel   [phone] [phone]   Clinic Follow up Note   Patient Care Team: Pcp, No as PCP - General [name], MD as Consulting Physician (Oncology) [name], RN as Nurse Navigator (Oncology) 03/29/2021  CHIEF COMPLAINT: Follow up metastatic colon cancer   SUMMARY OF ONCOLOGIC HISTORY: Oncology History Overview Note  Cancer Staging Cancer of right colon [facility]) Staging form: Colon and Rectum - Neuroendocine Tumors, AJCC 8th Edition - Clinical stage from 12/13/2020: Stage IV (cTX, cN1, cM1) - Signed by [name], MD on 03/01/2021    Cancer of right colon ([place])  12/07/2020 Procedure   Colonoscopy  Impression: - non-bleeding internal hemorrhoids - diverticulosis in entire examined colon - one 9 mm polyp in ascending colon removed with hot snare - one 13 mm polyp in transverse colon removed with hot fnare - likely malignant partially-obstructing tumor in cecum.   12/07/2020 Pathology Results   FINAL PATHOLOGIC DIAGNOSIS  SMALL BOWEL, BIOPSIES - duodenal mucosa within normal limits GASTRIC BODY AND ATRIUM, BIOPSY - gastric antral and body-type mucosa with moderate chronic Helicobacter pylori gastritis ASCENDING COLON, POLYPECTOMY - tubular adenoma with prominent eosinophilic infiltrate CECUM, "MASS," BIOPSIES - moderately differentiated adenocarcinoma TRANSVERSE COLON, POLYPECTOMY - high grade dysplasia involving tubulovillous adenoma - cauterized margins negative   12/13/2020 Cancer Staging   Staging form: Colon and Rectum - Neuroendocine Tumors, AJCC 8th Edition - Clinical stage from 12/13/2020: Stage IV (cTX, cN1, cM1) - Signed by [name], MD on 03/01/2021   12/22/2020 Imaging   CT CAP  IMPRESSION: Large 7.5 cm cecal mass with surrounding nodularity/lymphadenopathy 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 The largest measures 6 mm in the RUL. Findings are nonspecific but can be followed per Fleischner criteria Hiatal hernia   03/01/2021 Initial Diagnosis   Cancer of right colon ([place])   03/10/2021 Imaging   CT CAP  IMPRESSION: 1. Large infiltrative cecal mass measuring approximately 7.1 cm, reflecting the known right-sided colonic neoplasm. 2. Similar extensive peritoneal/omental nodularity throughout the abdomen and pelvis with trace free fluid in the pelvis along the pericolic gutters, consistent with disease involvement. 3. Similar prominent and mildly enlarged retroperitoneal and mesenteric lymph nodes, most likely reflecting disease involvement. 4. Numerous scattered bilateral pulmonary nodules measuring up to 6 mm are similar to prior and nonspecific but suspicious for disease involvement. Attention on follow-up imaging suggested. 5. Similar mild right-sided hydronephrosis without hydroureter, with the distal ureter traversing peritoneal nodularity. Likely reflecting partial UPJ obstruction given lack of hydroureter. Attention on follow-up imaging suggested. Cholelithiasis without findings of acute cholecystitis. 6.  Aortic Atherosclerosis (ICD10-I70.0).   03/13/2021 Pathology Results   FINAL MICROSCOPIC DIAGNOSIS:   A. OMENTUM, NEEDLE CORE BIOPSY:  - Metastatic adenocarcinoma, consistent with clinical impression of a  colonic primary.  See comment    03/16/2021 -  Chemotherapy    Patient is on Treatment Plan: COLORECTAL FOLFOX + BEVACIZUMAB Q14D         CURRENT THERAPY: First-line FOLFOX starting 03/16/21, plan to add bevacizumab C2 (03/29/21)  INTERVAL HISTORY: Kari Patel returns for follow up and treatment as scheduled. She was last seen 03/15/21, completed cycle 1 FOLFOX on 03/16/21. She is also receiving IV  venofer. The plan is to add bevacizumab with cycle 2 today.  She tolerated chemo well.  She had mild diarrhea on the night of treatment, 3 episodes.  Usually has constipation and abdominal cramping with IV iron.  She had a bowel movement today but was not complete.  Her appetite is not great due to constipation and bloating.  Energy level is adequate, denies fatigue.  She does get tired after walking occasionally.  Denies nausea/vomiting, new or worsening abdominal pain fever, chills, cough, chest pain, dyspnea, leg edema, cold sensitivity, or neuropathy.   MEDICAL HISTORY:  Past Medical History:  Diagnosis Date   Anemia    Colon cancer ([place])    cecum   Heart murmur    Hypertension     SURGICAL HISTORY: Past Surgical History:  Procedure Laterality Date   ABDOMINAL HYSTERECTOMY     IR IMAGING GUIDED PORT INSERTION  03/13/2021    I have reviewed the social history and family history with the patient and they are unchanged from previous note.  ALLERGIES:  is allergic to demerol [meperidine hcl].  MEDICATIONS:  Current Outpatient Medications  Medication Sig Dispense Refill   lidocaine-prilocaine (EMLA) cream Apply 1 application topically as needed. 30 g 0   ondansetron (ZOFRAN) 8 MG tablet Take 1 tablet (8 mg total) by mouth every 8 (eight) hours as needed for nausea or vomiting. 20 tablet 1   prochlorperazine (COMPAZINE) 10 MG tablet Take 1 tablet (10 mg total) by mouth every 6 (six) hours as needed for nausea or vomiting. 30 tablet 1   amLODipine (NORVASC) 10 MG tablet Take 10 mg by mouth daily.     lisinopril (ZESTRIL) 5 MG tablet Take 5 mg by mouth daily.     No current facility-administered medications for this visit.   Facility-Administered Medications Ordered in Other Visits  Medication Dose Route Frequency Provider Last Rate Last Admin   fluorouracil (ADRUCIL) 4,350 mg in sodium chloride 0.9 % 63 mL chemo infusion  2,400 mg/m2 (Treatment Plan Recorded) Intravenous 1 day or 1  dose [name], MD       heparin lock flush 100 unit/mL  500 Units Intracatheter Once PRN [name], MD       leucovorin 724 mg in dextrose 5 % 250 mL infusion  400 mg/m2 (Treatment Plan Recorded) Intravenous Once [name], MD 143 mL/hr at 03/29/21 1247 724 mg at 03/29/21 1247   oxaliplatin (ELOXATIN) 150 mg in dextrose 5 % 500 mL chemo infusion  84 mg/m2 (Treatment Plan Recorded) Intravenous Once [name], MD 265 mL/hr at 03/29/21 1245 150 mg at 03/29/21 1245   sodium chloride flush (NS) 0.9 % injection 10 mL  10 mL Intracatheter PRN [name], MD        PHYSICAL EXAMINATION: ECOG PERFORMANCE STATUS: 1 - Symptomatic but completely ambulatory  Vitals:   03/29/21 1006  BP: 135/61  Pulse: 74  Resp: 18  Temp: 98.3 F (36.8 C)  SpO2: 99%   Filed Weights   03/29/21 1006  Weight: 156 lb 4.8 oz (70.9 kg)    GENERAL:alert, no distress and comfortable SKIN: No rash EYES: sclera clear LUNGS: clear with normal breathing effort HEART: regular rate & rhythm, no lower extremity edema ABDOMEN:abdomen soft, non-tender and normal bowel sounds NEURO: alert & oriented x 3 with fluent speech, no focal motor/sensory deficits PAC without erythema  LABORATORY DATA:  I have reviewed the data as listed CBC Latest Ref Rng & Units 03/29/2021 03/20/2021 03/15/2021  WBC 4.0 - 10.5 K/uL 2.8(L) 4.6 5.3  Hemoglobin 12.0 - 15.0 g/dL 7.9(L) 7.4(L) 7.1(L)  Hematocrit 36.0 -  46.0 % 26.1(L) 24.9(L) 24.0(L)  Platelets 150 - 400 K/uL 304 293 325     CMP Latest Ref Rng & Units 03/29/2021 03/20/2021 03/15/2021  Glucose 70 - 99 mg/dL 136(H) 117(H) 89  BUN 8 - 23 mg/dL 5(L) 6(L) 6(L)  Creatinine 0.44 - 1.00 mg/dL 0.74 0.65 0.63  Sodium 135 - 145 mmol/L 141 138 140  Potassium 3.5 - 5.1 mmol/L 3.7 3.4(L) 3.9  Chloride 98 - 111 mmol/L 105 103 108  CO2 22 - 32 mmol/L _0 Calcium 8.9 - 10.3 mg/dL 9.3 9.3 9.5  Total Protein 6.5 - 8.1 g/dL 7.7 7.5 7.5  Total Bilirubin 0.3 - 1.2 mg/dL 0.2(L) 0.4 <0.2(L)  Alkaline  Phos 38 - 126 U/L 73 70 74  AST 15 - 41 U/L _1 ALT 0 - 44 U/L _2 RADIOGRAPHIC STUDIES: I have personally reviewed the radiological images as listed and agreed with the findings in the report. No results found.   ASSESSMENT & PLAN: Kari Patel is a 70 y.o. female with    1. 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 Work up with EGD and colonoscopy on 12/07/20 revealed a large cecal mass. Biopsy confirmed adenocarcinoma.  -She underwent repeat CT CAP on 03/10/21 showing: 7.1 cm cecal mass; similar extensive peritoneal/omental nodularity; similar prominent and mildly enlarged retroperitoneal and mesenteric lymph nodes; numerous scattered bilateral pulmonary nodules; similar mild right-sided hydronephrosis. -Omental biopsy 03/13/21 confirmed metastatic adenocarcinoma, consistent with colon primary.   -port placed on 03/13/21, began first line FOLFOX 03/16/21, tolerated well with mild diarrhea -FO pending. Plan to add bevacizumab with C2 (03/29/21)   2. Anemia, iron deficiency  -Likely secondary to #1 -She received IV iron Venofer 200 mg on 8/1, 8/4, 8/8, 8/15.  Next scheduled dose 8/22 -She has constipation from iron infusion, we reviewed symptom management -Anemia improving   3.  Goal of care discussion  -She understands the incurable nature of her stage IV cancer diagnosis  -Treatment goal is palliative, to control her disease, improve her symptoms, and prolong her life -she is full code now   Disposition: Ms. Moehle appears stable.  She completed 1 cycle of FOLFOX, tolerated well with mild diarrhea.  She has abdominal cramps and constipation with IV iron.  Side effects are partially managed with supportive care at home.  We reviewed symptom management, can take Colace and/or MiraLAX 1-2 times daily as needed.  She is otherwise able to recover and function well, no clinical evidence of  disease progression.  Labs reviewed, Hgb 7.9 improving on IV iron, next dose 03/2021.  ANC 1.6, we reviewed neutropenic precautions.  We will get G-CSF approved to add on in the future if ANC drops closer to 1.  Foundation One is still pending.  Adequate to proceed with cycle 2 FOLFOX at same dose, and add bevacizumab today, potential benefit and side effects were reviewed, she agrees to proceed.  Return for iron on 8/22, and follow-up in 2 weeks with cycle 3.  She knows to call sooner with any new or worsening concerns, we reviewed signs and symptoms of infection, bowel obstruction, and other chemo related side effects.  Total encounter time was 30 minutes.     Alla Feeling, NP 03/29/21

## 2021-03-29 ENCOUNTER — Inpatient Hospital Stay (HOSPITAL_BASED_OUTPATIENT_CLINIC_OR_DEPARTMENT_OTHER): Payer: Medicare HMO | Admitting: Nurse Practitioner

## 2021-03-29 ENCOUNTER — Other Ambulatory Visit: Payer: Self-pay

## 2021-03-29 ENCOUNTER — Inpatient Hospital Stay: Payer: Medicare HMO

## 2021-03-29 ENCOUNTER — Encounter: Payer: Self-pay | Admitting: Nurse Practitioner

## 2021-03-29 ENCOUNTER — Encounter (HOSPITAL_COMMUNITY): Payer: Self-pay

## 2021-03-29 VITALS — BP 135/61 | HR 74 | Temp 98.3°F | Resp 18 | Wt 156.3 lb

## 2021-03-29 DIAGNOSIS — D500 Iron deficiency anemia secondary to blood loss (chronic): Secondary | ICD-10-CM | POA: Diagnosis not present

## 2021-03-29 DIAGNOSIS — C182 Malignant neoplasm of ascending colon: Secondary | ICD-10-CM

## 2021-03-29 DIAGNOSIS — Z95828 Presence of other vascular implants and grafts: Secondary | ICD-10-CM

## 2021-03-29 DIAGNOSIS — Z5112 Encounter for antineoplastic immunotherapy: Secondary | ICD-10-CM | POA: Diagnosis not present

## 2021-03-29 LAB — CBC WITH DIFFERENTIAL (CANCER CENTER ONLY)
Abs Immature Granulocytes: 0.01 10*3/uL (ref 0.00–0.07)
Basophils Absolute: 0 10*3/uL (ref 0.0–0.1)
Basophils Relative: 1 %
Eosinophils Absolute: 0.1 10*3/uL (ref 0.0–0.5)
Eosinophils Relative: 4 %
HCT: 26.1 % — ABNORMAL LOW (ref 36.0–46.0)
Hemoglobin: 7.9 g/dL — ABNORMAL LOW (ref 12.0–15.0)
Immature Granulocytes: 0 %
Lymphocytes Relative: 24 %
Lymphs Abs: 0.7 10*3/uL (ref 0.7–4.0)
MCH: 22.9 pg — ABNORMAL LOW (ref 26.0–34.0)
MCHC: 30.3 g/dL (ref 30.0–36.0)
MCV: 75.7 fL — ABNORMAL LOW (ref 80.0–100.0)
Monocytes Absolute: 0.4 10*3/uL (ref 0.1–1.0)
Monocytes Relative: 14 %
Neutro Abs: 1.6 10*3/uL — ABNORMAL LOW (ref 1.7–7.7)
Neutrophils Relative %: 57 %
Platelet Count: 304 10*3/uL (ref 150–400)
RBC: 3.45 MIL/uL — ABNORMAL LOW (ref 3.87–5.11)
RDW: 21.6 % — ABNORMAL HIGH (ref 11.5–15.5)
WBC Count: 2.8 10*3/uL — ABNORMAL LOW (ref 4.0–10.5)
nRBC: 0 % (ref 0.0–0.2)

## 2021-03-29 LAB — CMP (CANCER CENTER ONLY)
ALT: 11 U/L (ref 0–44)
AST: 16 U/L (ref 15–41)
Albumin: 3.5 g/dL (ref 3.5–5.0)
Alkaline Phosphatase: 73 U/L (ref 38–126)
Anion gap: 13 (ref 5–15)
BUN: 5 mg/dL — ABNORMAL LOW (ref 8–23)
CO2: 23 mmol/L (ref 22–32)
Calcium: 9.3 mg/dL (ref 8.9–10.3)
Chloride: 105 mmol/L (ref 98–111)
Creatinine: 0.74 mg/dL (ref 0.44–1.00)
GFR, Estimated: >60 mL/min (ref >60)
Glucose, Bld: 136 mg/dL — ABNORMAL HIGH (ref 70–99)
Potassium: 3.7 mmol/L (ref 3.5–5.1)
Sodium: 141 mmol/L (ref 135–145)
Total Bilirubin: 0.2 mg/dL — ABNORMAL LOW (ref 0.3–1.2)
Total Protein: 7.7 g/dL (ref 6.5–8.1)

## 2021-03-29 LAB — TOTAL PROTEIN, URINE DIPSTICK: Protein, ur: 30 mg/dL — AB

## 2021-03-29 MED ORDER — OXALIPLATIN CHEMO INJECTION 100 MG/20ML
84.0000 mg/m2 | Freq: Once | INTRAVENOUS | Status: AC
  Administered 2021-03-29: 150 mg via INTRAVENOUS
  Filled 2021-03-29: qty 30

## 2021-03-29 MED ORDER — SODIUM CHLORIDE 0.9 % IV SOLN
2400.0000 mg/m2 | INTRAVENOUS | Status: DC
  Administered 2021-03-29: 4350 mg via INTRAVENOUS
  Filled 2021-03-29: qty 87

## 2021-03-29 MED ORDER — DEXTROSE 5 % IV SOLN: Freq: Once | INTRAVENOUS | Status: AC

## 2021-03-29 MED ORDER — SODIUM CHLORIDE 0.9 % IV SOLN
350.0000 mg | Freq: Once | INTRAVENOUS | Status: AC
  Administered 2021-03-29: 350 mg via INTRAVENOUS
  Filled 2021-03-29: qty 14

## 2021-03-29 MED ORDER — SODIUM CHLORIDE 0.9% FLUSH
10.0000 mL | INTRAVENOUS | Status: AC | PRN
  Administered 2021-03-29: 10 mL

## 2021-03-29 MED ORDER — SODIUM CHLORIDE 0.9 % IV SOLN: Freq: Once | INTRAVENOUS | Status: AC

## 2021-03-29 MED ORDER — SODIUM CHLORIDE 0.9% FLUSH: 10.0000 mL | INTRAVENOUS | Status: DC | PRN

## 2021-03-29 MED ORDER — PALONOSETRON HCL INJECTION 0.25 MG/5ML
0.2500 mg | Freq: Once | INTRAVENOUS | Status: AC
  Administered 2021-03-29: 0.25 mg via INTRAVENOUS
  Filled 2021-03-29: qty 5

## 2021-03-29 MED ORDER — SODIUM CHLORIDE 0.9 % IV SOLN
10.0000 mg | Freq: Once | INTRAVENOUS | Status: AC
  Administered 2021-03-29: 10 mg via INTRAVENOUS
  Filled 2021-03-29: qty 10

## 2021-03-29 MED ORDER — LEUCOVORIN CALCIUM INJECTION 350 MG
400.0000 mg/m2 | Freq: Once | INTRAVENOUS | Status: AC
  Administered 2021-03-29: 724 mg via INTRAVENOUS
  Filled 2021-03-29: qty 36.2

## 2021-03-29 MED ORDER — HEPARIN SOD (PORK) LOCK FLUSH 100 UNIT/ML IV SOLN: 500.0000 [IU] | Freq: Once | INTRAVENOUS | Status: DC | PRN

## 2021-03-29 NOTE — Progress Notes (Signed)
Urine sample just obtained. Per Regan Rakers, NP: "you can go ahead with premeds but wait to start beva. she is OK to treat with labs"

## 2021-03-29 NOTE — Progress Notes (Signed)
Spoke w/ [name] and she would like to adjust dose of zirabev based on the new weight.  Larene Beach, PharmD

## 2021-03-29 NOTE — Patient Instructions (Signed)
[facility]  Discharge Instructions: Thank you for choosing [facility] to provide your oncology and hematology care.   If you have a lab appointment with the [facility], please go directly to the [facility] and check in at the registration area.   Wear comfortable clothing and clothing appropriate for easy access to any Portacath or PICC line.   We strive to give you quality time with your provider. You may need to reschedule your appointment if you arrive late (15 or more minutes).  Arriving late affects you and other patients whose appointments are after yours.  Also, if you miss three or more appointments without notifying the office, you may be dismissed from the clinic at the provider's discretion.      For prescription refill requests, have your pharmacy contact our office and allow 72 hours for refills to be completed.    Today you received the following chemotherapy and/or immunotherapy agents Avastin, Oxaliplatin, leucovorin and 5 FU.      To help prevent nausea and vomiting after your treatment, we encourage you to take your nausea medication as directed.  BELOW ARE SYMPTOMS THAT SHOULD BE REPORTED IMMEDIATELY: *FEVER GREATER THAN 100.4 F (38 C) OR HIGHER *CHILLS OR SWEATING *NAUSEA AND VOMITING THAT IS NOT CONTROLLED WITH YOUR NAUSEA MEDICATION *UNUSUAL SHORTNESS OF BREATH *UNUSUAL BRUISING OR BLEEDING *URINARY PROBLEMS (pain or burning when urinating, or frequent urination) *BOWEL PROBLEMS (unusual diarrhea, constipation, pain near the anus) TENDERNESS IN MOUTH AND THROAT WITH OR WITHOUT PRESENCE OF ULCERS (sore throat, sores in mouth, or a toothache) UNUSUAL RASH, SWELLING OR PAIN  UNUSUAL VAGINAL DISCHARGE OR ITCHING   Items with * indicate a potential emergency and should be followed up as soon as possible or go to the Emergency Department if any problems should occur.  Please show the CHEMOTHERAPY ALERT CARD or  IMMUNOTHERAPY ALERT CARD at check-in to the Emergency Department and triage nurse.  Should you have questions after your visit or need to cancel or reschedule your appointment, please contact [facility]  Dept: [phone]  and follow the prompts.  Office hours are 8:00 a.m. to 4:30 p.m. Monday - Friday. Please note that voicemails left after 4:00 p.m. may not be returned until the following business day.  We are closed weekends and major holidays. You have access to a nurse at all times for urgent questions. Please call the main number to the clinic Dept: [phone] and follow the prompts.   For any non-urgent questions, you may also contact your provider using MyChart. We now offer e-Visits for anyone 7 and older to request care online for non-urgent symptoms. For details visit [email or web address].   Also download the MyChart app! Go to the app store, search "MyChart", open the app, select [facility], and log in with your MyChart username and password.  Due to Covid, a mask is required upon entering the hospital/clinic. If you do not have a mask, one will be given to you upon arrival. For doctor visits, patients may have 1 support person aged 67 or older with them. For treatment visits, patients cannot have anyone with them due to current Covid guidelines and our immunocompromised population.

## 2021-03-30 ENCOUNTER — Telehealth: Payer: Self-pay | Admitting: Hematology

## 2021-03-30 NOTE — Telephone Encounter (Signed)
Scheduled follow-up appointments per 8/17 los. Patient is aware. 

## 2021-03-31 ENCOUNTER — Other Ambulatory Visit: Payer: Self-pay

## 2021-03-31 ENCOUNTER — Inpatient Hospital Stay: Payer: Medicare HMO

## 2021-03-31 VITALS — BP 132/63 | HR 68 | Temp 98.2°F | Resp 20

## 2021-03-31 DIAGNOSIS — C182 Malignant neoplasm of ascending colon: Secondary | ICD-10-CM

## 2021-03-31 DIAGNOSIS — Z5112 Encounter for antineoplastic immunotherapy: Secondary | ICD-10-CM | POA: Diagnosis not present

## 2021-03-31 MED ORDER — HEPARIN SOD (PORK) LOCK FLUSH 100 UNIT/ML IV SOLN
500.0000 [IU] | Freq: Once | INTRAVENOUS | Status: AC | PRN
  Administered 2021-03-31: 500 [IU]

## 2021-03-31 MED ORDER — SODIUM CHLORIDE 0.9% FLUSH
10.0000 mL | INTRAVENOUS | Status: DC | PRN
  Administered 2021-03-31: 10 mL

## 2021-04-03 ENCOUNTER — Other Ambulatory Visit: Payer: Self-pay

## 2021-04-03 ENCOUNTER — Inpatient Hospital Stay: Payer: Medicare HMO

## 2021-04-03 VITALS — BP 127/55 | HR 79 | Temp 99.4°F | Resp 16

## 2021-04-03 DIAGNOSIS — D500 Iron deficiency anemia secondary to blood loss (chronic): Secondary | ICD-10-CM

## 2021-04-03 DIAGNOSIS — Z5112 Encounter for antineoplastic immunotherapy: Secondary | ICD-10-CM | POA: Diagnosis not present

## 2021-04-03 MED ORDER — SODIUM CHLORIDE 0.9% FLUSH
10.0000 mL | Freq: Once | INTRAVENOUS | Status: AC | PRN
  Administered 2021-04-03: 10 mL

## 2021-04-03 MED ORDER — SODIUM CHLORIDE 0.9 % IV SOLN: Freq: Once | INTRAVENOUS | Status: AC

## 2021-04-03 MED ORDER — HEPARIN SOD (PORK) LOCK FLUSH 100 UNIT/ML IV SOLN
500.0000 [IU] | Freq: Once | INTRAVENOUS | Status: AC | PRN
  Administered 2021-04-03: 500 [IU]

## 2021-04-03 MED ORDER — ACETAMINOPHEN 325 MG PO TABS
650.0000 mg | ORAL_TABLET | Freq: Once | ORAL | Status: AC
  Administered 2021-04-03: 650 mg via ORAL
  Filled 2021-04-03: qty 2

## 2021-04-03 MED ORDER — SODIUM CHLORIDE 0.9 % IV SOLN
300.0000 mg | Freq: Once | INTRAVENOUS | Status: AC
  Administered 2021-04-03: 300 mg via INTRAVENOUS
  Filled 2021-04-03: qty 300

## 2021-04-03 NOTE — Patient Instructions (Signed)

## 2021-04-04 ENCOUNTER — Other Ambulatory Visit: Payer: Self-pay | Admitting: Nurse Practitioner

## 2021-04-12 MED FILL — Dexamethasone Sodium Phosphate Inj 100 MG/10ML: INTRAMUSCULAR | Qty: 1 | Status: AC

## 2021-04-13 ENCOUNTER — Other Ambulatory Visit: Payer: Self-pay

## 2021-04-13 ENCOUNTER — Inpatient Hospital Stay (HOSPITAL_BASED_OUTPATIENT_CLINIC_OR_DEPARTMENT_OTHER): Payer: Medicare HMO | Admitting: Hematology

## 2021-04-13 ENCOUNTER — Inpatient Hospital Stay: Payer: Medicare HMO

## 2021-04-13 ENCOUNTER — Encounter: Payer: Self-pay | Admitting: Hematology

## 2021-04-13 ENCOUNTER — Inpatient Hospital Stay: Payer: Medicare HMO | Attending: Hematology

## 2021-04-13 VITALS — BP 137/59 | HR 89 | Temp 98.5°F | Resp 18 | Ht 63.0 in | Wt 150.3 lb

## 2021-04-13 DIAGNOSIS — D500 Iron deficiency anemia secondary to blood loss (chronic): Secondary | ICD-10-CM

## 2021-04-13 DIAGNOSIS — C182 Malignant neoplasm of ascending colon: Secondary | ICD-10-CM

## 2021-04-13 DIAGNOSIS — D509 Iron deficiency anemia, unspecified: Secondary | ICD-10-CM | POA: Diagnosis not present

## 2021-04-13 DIAGNOSIS — K5900 Constipation, unspecified: Secondary | ICD-10-CM | POA: Diagnosis not present

## 2021-04-13 DIAGNOSIS — Z5189 Encounter for other specified aftercare: Secondary | ICD-10-CM | POA: Insufficient documentation

## 2021-04-13 DIAGNOSIS — Z5112 Encounter for antineoplastic immunotherapy: Secondary | ICD-10-CM | POA: Diagnosis not present

## 2021-04-13 DIAGNOSIS — Z5111 Encounter for antineoplastic chemotherapy: Secondary | ICD-10-CM | POA: Insufficient documentation

## 2021-04-13 DIAGNOSIS — C786 Secondary malignant neoplasm of retroperitoneum and peritoneum: Secondary | ICD-10-CM | POA: Diagnosis not present

## 2021-04-13 DIAGNOSIS — Z95828 Presence of other vascular implants and grafts: Secondary | ICD-10-CM

## 2021-04-13 LAB — CBC WITH DIFFERENTIAL (CANCER CENTER ONLY)
Abs Immature Granulocytes: 0.01 10*3/uL (ref 0.00–0.07)
Basophils Absolute: 0 10*3/uL (ref 0.0–0.1)
Basophils Relative: 1 %
Eosinophils Absolute: 0.1 10*3/uL (ref 0.0–0.5)
Eosinophils Relative: 2 %
HCT: 30.3 % — ABNORMAL LOW (ref 36.0–46.0)
Hemoglobin: 9.4 g/dL — ABNORMAL LOW (ref 12.0–15.0)
Immature Granulocytes: 0 %
Lymphocytes Relative: 18 %
Lymphs Abs: 0.7 10*3/uL (ref 0.7–4.0)
MCH: 24.5 pg — ABNORMAL LOW (ref 26.0–34.0)
MCHC: 31 g/dL (ref 30.0–36.0)
MCV: 79.1 fL — ABNORMAL LOW (ref 80.0–100.0)
Monocytes Absolute: 0.5 10*3/uL (ref 0.1–1.0)
Monocytes Relative: 13 %
Neutro Abs: 2.4 10*3/uL (ref 1.7–7.7)
Neutrophils Relative %: 66 %
Platelet Count: 298 10*3/uL (ref 150–400)
RBC: 3.83 MIL/uL — ABNORMAL LOW (ref 3.87–5.11)
RDW: 24.4 % — ABNORMAL HIGH (ref 11.5–15.5)
WBC Count: 3.6 10*3/uL — ABNORMAL LOW (ref 4.0–10.5)
nRBC: 0 % (ref 0.0–0.2)

## 2021-04-13 LAB — CEA (IN HOUSE-CHCC): CEA (CHCC-In House): 60.24 ng/mL — ABNORMAL HIGH (ref 0.00–5.00)

## 2021-04-13 LAB — CMP (CANCER CENTER ONLY)
ALT: 6 U/L (ref 0–44)
AST: 9 U/L — ABNORMAL LOW (ref 15–41)
Albumin: 3.5 g/dL (ref 3.5–5.0)
Alkaline Phosphatase: 88 U/L (ref 38–126)
Anion gap: 13 (ref 5–15)
BUN: 7 mg/dL — ABNORMAL LOW (ref 8–23)
CO2: 23 mmol/L (ref 22–32)
Calcium: 9.8 mg/dL (ref 8.9–10.3)
Chloride: 103 mmol/L (ref 98–111)
Creatinine: 0.64 mg/dL (ref 0.44–1.00)
GFR, Estimated: >60 mL/min (ref >60)
Glucose, Bld: 128 mg/dL — ABNORMAL HIGH (ref 70–99)
Potassium: 3.3 mmol/L — ABNORMAL LOW (ref 3.5–5.1)
Sodium: 139 mmol/L (ref 135–145)
Total Bilirubin: 0.4 mg/dL (ref 0.3–1.2)
Total Protein: 8.2 g/dL — ABNORMAL HIGH (ref 6.5–8.1)

## 2021-04-13 LAB — TOTAL PROTEIN, URINE DIPSTICK: Protein, ur: 100 mg/dL — AB

## 2021-04-13 MED ORDER — SODIUM CHLORIDE 0.9 % IV SOLN
5.0000 mg/kg | Freq: Once | INTRAVENOUS | Status: AC
  Administered 2021-04-13: 350 mg via INTRAVENOUS
  Filled 2021-04-13: qty 14

## 2021-04-13 MED ORDER — SODIUM CHLORIDE 0.9% FLUSH: 10.0000 mL | INTRAVENOUS | Status: DC | PRN

## 2021-04-13 MED ORDER — HEPARIN SOD (PORK) LOCK FLUSH 100 UNIT/ML IV SOLN: 500.0000 [IU] | Freq: Once | INTRAVENOUS | Status: DC | PRN

## 2021-04-13 MED ORDER — PALONOSETRON HCL INJECTION 0.25 MG/5ML
0.2500 mg | Freq: Once | INTRAVENOUS | Status: AC
  Administered 2021-04-13: 0.25 mg via INTRAVENOUS
  Filled 2021-04-13: qty 5

## 2021-04-13 MED ORDER — SODIUM CHLORIDE 0.9 % IV SOLN
10.0000 mg | Freq: Once | INTRAVENOUS | Status: AC
  Administered 2021-04-13: 10 mg via INTRAVENOUS
  Filled 2021-04-13: qty 10

## 2021-04-13 MED ORDER — DEXTROSE 5 % IV SOLN: Freq: Once | INTRAVENOUS | Status: AC

## 2021-04-13 MED ORDER — SODIUM CHLORIDE 0.9 % IV SOLN: Freq: Once | INTRAVENOUS | Status: AC

## 2021-04-13 MED ORDER — FLUOROURACIL CHEMO INJECTION 5 GM/100ML
2400.0000 mg/m2 | INTRAVENOUS | Status: DC
  Administered 2021-04-13: 4350 mg via INTRAVENOUS
  Filled 2021-04-13: qty 87

## 2021-04-13 MED ORDER — LEUCOVORIN CALCIUM INJECTION 350 MG
400.0000 mg/m2 | Freq: Once | INTRAVENOUS | Status: AC
  Administered 2021-04-13: 724 mg via INTRAVENOUS
  Filled 2021-04-13: qty 36.2

## 2021-04-13 MED ORDER — POTASSIUM CHLORIDE CRYS ER 20 MEQ PO TBCR: 20.0000 meq | EXTENDED_RELEASE_TABLET | Freq: Two times a day (BID) | ORAL | 0 refills | Status: DC

## 2021-04-13 MED ORDER — OXALIPLATIN CHEMO INJECTION 100 MG/20ML
84.0000 mg/m2 | Freq: Once | INTRAVENOUS | Status: AC
  Administered 2021-04-13: 150 mg via INTRAVENOUS
  Filled 2021-04-13: qty 30

## 2021-04-13 MED ORDER — SODIUM CHLORIDE 0.9% FLUSH
10.0000 mL | INTRAVENOUS | Status: AC | PRN
  Administered 2021-04-13: 10 mL

## 2021-04-13 NOTE — Progress Notes (Signed)
[facility]   [phone] [phone]   Clinic Follow up Note   Patient Care Team: Pcp, No as PCP - General [name], MD as Consulting Physician (Oncology) Kari Bake, RN as Nurse Navigator (Oncology)  Date of Service:  04/13/2021  CHIEF COMPLAINT: f/u of metastatic colon cancer  CURRENT THERAPY:  First-line FOLFOX and bevacizumab, starting 03/16/21  ASSESSMENT & PLAN:  Kari Patel is a 70 y.o. female with   1. 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 Work up with EGD and colonoscopy on 12/07/20 revealed a large cecal mass. Biopsy confirmed adenocarcinoma.  -She underwent repeat CT CAP on 03/10/21 showing: 7.1 cm cecal mass; similar extensive peritoneal/omental nodularity; similar prominent and mildly enlarged retroperitoneal and mesenteric lymph nodes; numerous scattered bilateral pulmonary nodules; similar mild right-sided hydronephrosis. -She proceeded to biopsy of an omental nodule on 03/13/21 confirming metastatic adenocarcinoma, consistent with colon primary.  -she began first line FOLFOX 03/16/21, and bevacizumab added with C2 (03/29/21), she has been tolerating well except constipations  -FO showed: MS-stable, low mutation burden 4 Muts/Mb. KRAS (+), I reviewed with pt  -labs reviewed, overall adequate to proceed with C3 today.  2. Symptom management: Constipation, weight loss -secondary to FOLFOX -she has lost weight because of the constipation. (10 lbs since starting treatment) -she tried colace with poor tolerance. She uses Miralax as needed. I recommended she take it once a day. She can also try magnesium citrate. -She would like to meet with our nutritionist, and I will reach out to them today. -She has started supplementing with Ensure. -her potassium is slightly low today, likely due to her eating less. I called in supplement.   3. Anemia,  iron deficiency  -likely related to her colon cancer and iron deficiency -She has received IV iron Venofer 200 mg, most recently 04/03/21. Hgb improving.   4. Goal of care discussion  -The patient understands the goal of care is palliative. -she is full code now      PLAN:  -proceed with C3 FOLFOX and Beva -nutrition referral -I prescribed KCL   No problem-specific Assessment & Plan notes found for this encounter.   SUMMARY OF ONCOLOGIC HISTORY: Oncology History Overview Note  Cancer Staging Cancer of right colon [facility]) Staging form: Colon and Rectum - Neuroendocine Tumors, AJCC 8th Edition - Clinical stage from 12/13/2020: Stage IV (cTX, cN1, cM1) - Signed by [name], MD on 03/01/2021    Cancer of right colon [facility])  12/07/2020 Procedure   Colonoscopy  Impression: - non-bleeding internal hemorrhoids - diverticulosis in entire examined colon - one 9 mm polyp in ascending colon removed with hot snare - one 13 mm polyp in transverse colon removed with hot fnare - likely malignant partially-obstructing tumor in cecum.   12/07/2020 Pathology Results   FINAL PATHOLOGIC DIAGNOSIS  SMALL BOWEL, BIOPSIES - duodenal mucosa within normal limits GASTRIC BODY AND ATRIUM, BIOPSY - gastric antral and body-type mucosa with moderate chronic Helicobacter pylori gastritis ASCENDING COLON, POLYPECTOMY - tubular adenoma with prominent eosinophilic infiltrate CECUM, "MASS," BIOPSIES - moderately differentiated adenocarcinoma TRANSVERSE COLON, POLYPECTOMY - high grade dysplasia involving tubulovillous adenoma - cauterized margins negative   12/13/2020 Cancer Staging   Staging form: Colon and Rectum - Neuroendocine Tumors, AJCC 8th Edition - Clinical stage from 12/13/2020: Stage IV (cTX, cN1, cM1) - Signed by [name], MD on 03/01/2021   12/22/2020 Imaging   CT  CAP  IMPRESSION: Large 7.5 cm cecal mass with surrounding nodularity/lymphadenopathy 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 The largest measures 6 mm in the RUL. Findings are nonspecific but can be followed per Fleischner criteria Hiatal hernia   03/01/2021 Initial Diagnosis   Cancer of right colon ([place])   03/10/2021 Imaging   CT CAP  IMPRESSION: 1. Large infiltrative cecal mass measuring approximately 7.1 cm, reflecting the known right-sided colonic neoplasm. 2. Similar extensive peritoneal/omental nodularity throughout the abdomen and pelvis with trace free fluid in the pelvis along the pericolic gutters, consistent with disease involvement. 3. Similar prominent and mildly enlarged retroperitoneal and mesenteric lymph nodes, most likely reflecting disease involvement. 4. Numerous scattered bilateral pulmonary nodules measuring up to 6 mm are similar to prior and nonspecific but suspicious for disease involvement. Attention on follow-up imaging suggested. 5. Similar mild right-sided hydronephrosis without hydroureter, with the distal ureter traversing peritoneal nodularity. Likely reflecting partial UPJ obstruction given lack of hydroureter. Attention on follow-up imaging suggested. Cholelithiasis without findings of acute cholecystitis. 6.  Aortic Atherosclerosis (ICD10-I70.0).   03/13/2021 Pathology Results   FINAL MICROSCOPIC DIAGNOSIS:   A. OMENTUM, NEEDLE CORE BIOPSY:  - Metastatic adenocarcinoma, consistent with clinical impression of a  colonic primary.  See comment    03/13/2021 Miscellaneous      03/16/2021 -  Chemotherapy    Patient is on Treatment Plan: COLORECTAL FOLFOX + BEVACIZUMAB Q14D          INTERVAL HISTORY:  Kari Patel is here for a follow up of metastatic colon cancer. She was last seen by me  on 03/15/21 and by NP Lacie in the interim. She presents to the clinic alone. She reports doing well aside from constipation. She notes she used Miralax for relief, but she developed abdominal cramps and gas from this. She notes she would like to see our nutritionist, but she states she has not seen one as of yet. She reports she is supplementing with Ensure, starting yesterday. She denies any numbness or tingling. She reports her energy has improved with the IV Venofer.   All other systems were reviewed with the patient and are negative.  MEDICAL HISTORY:  Past Medical History:  Diagnosis Date   Anemia    Colon cancer ([place])    cecum   Heart murmur    Hypertension     SURGICAL HISTORY: Past Surgical History:  Procedure Laterality Date   ABDOMINAL HYSTERECTOMY     IR IMAGING GUIDED PORT INSERTION  03/13/2021    I have reviewed the social history and family history with the patient and they are unchanged from previous note.  ALLERGIES:  is allergic to demerol [meperidine hcl].  MEDICATIONS:  Current Outpatient Medications  Medication Sig Dispense Refill   lidocaine-prilocaine (EMLA) cream Apply 1 application topically as needed. 30 g 0   ondansetron (ZOFRAN) 8 MG tablet Take 1 tablet (8 mg total) by mouth every 8 (eight) hours as needed for nausea or vomiting. 20 tablet 1   potassium chloride SA (KLOR-CON) 20 MEQ tablet Take 1 tablet (20 mEq total) by mouth 2 (two) times daily. 20 tablet 0   prochlorperazine (COMPAZINE) 10 MG tablet Take 1 tablet (10 mg total) by mouth every 6 (six) hours as needed for nausea or vomiting. 30 tablet 1   amLODipine (NORVASC)  10 MG tablet Take 10 mg by mouth daily.     lisinopril (ZESTRIL) 5 MG tablet Take 5 mg by mouth daily.     No current facility-administered medications for this visit.   Facility-Administered Medications Ordered in Other Visits  Medication Dose Route Frequency Provider Last Rate Last Admin   fluorouracil (ADRUCIL) 4,350 mg in  sodium chloride 0.9 % 63 mL chemo infusion  2,400 mg/m2 (Treatment Plan Recorded) Intravenous 1 day or 1 dose [name], MD   4,350 mg at 04/13/21 1443   heparin lock flush 100 unit/mL  500 Units Intracatheter Once PRN [name], MD       sodium chloride flush (NS) 0.9 % injection 10 mL  10 mL Intracatheter PRN [name], MD        PHYSICAL EXAMINATION: ECOG PERFORMANCE STATUS: 1 - Symptomatic but completely ambulatory  Vitals:   04/13/21 1013  BP: (!) 137/59  Pulse: 89  Resp: 18  Temp: 98.5 F (36.9 C)  SpO2: 99%   Wt Readings from Last 3 Encounters:  04/13/21 150 lb 4.8 oz (68.2 kg)  03/29/21 156 lb 4.8 oz (70.9 kg)  03/16/21 160 lb 4 oz (72.7 kg)     GENERAL:alert, no distress and comfortable SKIN: skin color normal, no rashes or significant lesions EYES: normal, Conjunctiva are pink and non-injected, sclera clear  NEURO: alert & oriented x 3 with fluent speech  LABORATORY DATA:  I have reviewed the data as listed CBC Latest Ref Rng & Units 04/13/2021 03/29/2021 03/20/2021  WBC 4.0 - 10.5 K/uL 3.6(L) 2.8(L) 4.6  Hemoglobin 12.0 - 15.0 g/dL 9.4(L) 7.9(L) 7.4(L)  Hematocrit 36.0 - 46.0 % 30.3(L) 26.1(L) 24.9(L)  Platelets 150 - 400 K/uL 298 304 293     CMP Latest Ref Rng & Units 04/13/2021 03/29/2021 03/20/2021  Glucose 70 - 99 mg/dL 128(H) 136(H) 117(H)  BUN 8 - 23 mg/dL 7(L) 5(L) 6(L)  Creatinine 0.44 - 1.00 mg/dL 0.64 0.74 0.65  Sodium 135 - 145 mmol/L 139 141 138  Potassium 3.5 - 5.1 mmol/L 3.3(L) 3.7 3.4(L)  Chloride 98 - 111 mmol/L 103 105 103  CO2 22 - 32 mmol/L _0 Calcium 8.9 - 10.3 mg/dL 9.8 9.3 9.3  Total Protein 6.5 - 8.1 g/dL 8.2(H) 7.7 7.5  Total Bilirubin 0.3 - 1.2 mg/dL 0.4 0.2(L) 0.4  Alkaline Phos 38 - 126 U/L 88 73 70  AST 15 - 41 U/L 9(L) 16 21  ALT 0 - 44 U/L _1 RADIOGRAPHIC STUDIES: I have personally reviewed the radiological images as listed and agreed with the findings in the report. No results found.    No orders of the  defined types were placed in this encounter.  All questions were answered. The patient knows to call the clinic with any problems, questions or concerns. No barriers to learning was detected. The total time spent in the appointment was 30 minutes.     [name], MD 04/13/2021   I, Wilburn Mylar, am acting as scribe for [name], MD.   I have reviewed the above documentation for accuracy and completeness, and I agree with the above.

## 2021-04-13 NOTE — Progress Notes (Signed)
OK to treat w/ urine protein = 2+ per [name].  Kennith Center, Pharm.D., CPP 04/13/2021'@11'$ :23 AM

## 2021-04-13 NOTE — Patient Instructions (Signed)
[facility]  Discharge Instructions: Thank you for choosing [facility] to provide your oncology and hematology care.   If you have a lab appointment with the [facility], please go directly to the [facility] and check in at the registration area.   Wear comfortable clothing and clothing appropriate for easy access to any Portacath or PICC line.   We strive to give you quality time with your provider. You may need to reschedule your appointment if you arrive late (15 or more minutes).  Arriving late affects you and other patients whose appointments are after yours.  Also, if you miss three or more appointments without notifying the office, you may be dismissed from the clinic at the provider's discretion.      For prescription refill requests, have your pharmacy contact our office and allow 72 hours for refills to be completed.    Today you received the following chemotherapy and/or immunotherapy agents fluorourcil, oxaliplatin, leucovorin, bevacizumab      To help prevent nausea and vomiting after your treatment, we encourage you to take your nausea medication as directed.  BELOW ARE SYMPTOMS THAT SHOULD BE REPORTED IMMEDIATELY: *FEVER GREATER THAN 100.4 F (38 C) OR HIGHER *CHILLS OR SWEATING *NAUSEA AND VOMITING THAT IS NOT CONTROLLED WITH YOUR NAUSEA MEDICATION *UNUSUAL SHORTNESS OF BREATH *UNUSUAL BRUISING OR BLEEDING *URINARY PROBLEMS (pain or burning when urinating, or frequent urination) *BOWEL PROBLEMS (unusual diarrhea, constipation, pain near the anus) TENDERNESS IN MOUTH AND THROAT WITH OR WITHOUT PRESENCE OF ULCERS (sore throat, sores in mouth, or a toothache) UNUSUAL RASH, SWELLING OR PAIN  UNUSUAL VAGINAL DISCHARGE OR ITCHING   Items with * indicate a potential emergency and should be followed up as soon as possible or go to the Emergency Department if any problems should occur.  Please show the CHEMOTHERAPY ALERT CARD or  IMMUNOTHERAPY ALERT CARD at check-in to the Emergency Department and triage nurse.  Should you have questions after your visit or need to cancel or reschedule your appointment, please contact [facility]  Dept: [phone]  and follow the prompts.  Office hours are 8:00 a.m. to 4:30 p.m. Monday - Friday. Please note that voicemails left after 4:00 p.m. may not be returned until the following business day.  We are closed weekends and major holidays. You have access to a nurse at all times for urgent questions. Please call the main number to the clinic Dept: [phone] and follow the prompts.   For any non-urgent questions, you may also contact your provider using MyChart. We now offer e-Visits for anyone 71 and older to request care online for non-urgent symptoms. For details visit [email or web address].   Also download the MyChart app! Go to the app store, search "MyChart", open the app, select [facility], and log in with your MyChart username and password.  Due to Covid, a mask is required upon entering the hospital/clinic. If you do not have a mask, one will be given to you upon arrival. For doctor visits, patients may have 1 support person aged 71 or older with them. For treatment visits, patients cannot have anyone with them due to current Covid guidelines and our immunocompromised population.

## 2021-04-14 ENCOUNTER — Inpatient Hospital Stay: Payer: Medicare HMO | Admitting: Dietician

## 2021-04-14 NOTE — Progress Notes (Signed)
70 year old female diagnosed with metastatic colon cancer status post 3 cycles of FOLFOX and bevacizumab.  She is followed by [name].  Past medical history includes anemia and hypertension.  Medications include Zofran, Compazine, and potassium chloride.  MD has added MiraLAX and suggested magnesium citrate.  Labs include glucose 128 and potassium 3.3 on September 1.  Height: 5 foot 3 inches. Weight: 150.3 pounds on September 1. Usual body weight: 160 pounds August 4. BMI: 26.62.  Contacted patient by telephone.  Patient reports abdominal cramping and pain secondary to constipation.  She has not been taking nausea medications but she does take iron which is contributing to constipation.  She has had poor appetite and 10 pound weight loss (6%) over approximately 1 month.  Patient denies nausea, vomiting, and diarrhea.  Nutrition diagnosis: Unintended weight loss related to colon cancer and associated treatments as evidenced by 10 pound weight loss.  Intervention: Educated patient on strategies to improve constipation including adding fiber slowly back into her diet as well as increasing fluids. Encourage medications as prescribed by MD. Encouraged physical activity as allowed by MD. Encourage patient to eat regular meals with snacks to promote weight stabilization. Provide nutrition facts sheets on constipation, dehydration, potassium and iron.  Contact information given for further questions or concerns.  Monitoring, evaluation, goals: Patient will tolerate adequate calories and protein to promote lean body mass while resolving constipation.  Next visit: No follow-up scheduled.  Patient to contact RD with questions or concerns.  **Disclaimer: This note was dictated with voice recognition software. Similar sounding words can inadvertently be transcribed and this note may contain transcription errors which may not have been corrected upon publication of note.**

## 2021-04-15 ENCOUNTER — Inpatient Hospital Stay: Payer: Medicare HMO

## 2021-04-15 ENCOUNTER — Other Ambulatory Visit: Payer: Self-pay

## 2021-04-15 VITALS — BP 145/60 | HR 82 | Temp 98.0°F | Resp 18

## 2021-04-15 DIAGNOSIS — Z5112 Encounter for antineoplastic immunotherapy: Secondary | ICD-10-CM | POA: Diagnosis not present

## 2021-04-15 DIAGNOSIS — C182 Malignant neoplasm of ascending colon: Secondary | ICD-10-CM

## 2021-04-15 MED ORDER — PEGFILGRASTIM-CBQV 6 MG/0.6ML ~~LOC~~ SOSY
6.0000 mg | PREFILLED_SYRINGE | Freq: Once | SUBCUTANEOUS | Status: AC
  Administered 2021-04-15: 6 mg via SUBCUTANEOUS

## 2021-04-15 MED ORDER — PEGFILGRASTIM-CBQV 6 MG/0.6ML ~~LOC~~ SOSY
PREFILLED_SYRINGE | SUBCUTANEOUS | Status: AC
  Filled 2021-04-15: qty 0.6

## 2021-04-15 NOTE — Patient Instructions (Signed)

## 2021-04-25 MED FILL — Dexamethasone Sodium Phosphate Inj 100 MG/10ML: INTRAMUSCULAR | Qty: 1 | Status: AC

## 2021-04-26 ENCOUNTER — Encounter: Payer: Self-pay | Admitting: Hematology

## 2021-04-26 ENCOUNTER — Inpatient Hospital Stay (HOSPITAL_BASED_OUTPATIENT_CLINIC_OR_DEPARTMENT_OTHER): Payer: Medicare HMO | Admitting: Hematology

## 2021-04-26 ENCOUNTER — Other Ambulatory Visit: Payer: Self-pay

## 2021-04-26 ENCOUNTER — Inpatient Hospital Stay: Payer: Medicare HMO

## 2021-04-26 VITALS — BP 150/68 | HR 92 | Temp 98.5°F | Resp 18 | Ht 63.0 in | Wt 146.1 lb

## 2021-04-26 DIAGNOSIS — C182 Malignant neoplasm of ascending colon: Secondary | ICD-10-CM

## 2021-04-26 DIAGNOSIS — Z5112 Encounter for antineoplastic immunotherapy: Secondary | ICD-10-CM | POA: Diagnosis not present

## 2021-04-26 DIAGNOSIS — D500 Iron deficiency anemia secondary to blood loss (chronic): Secondary | ICD-10-CM | POA: Diagnosis not present

## 2021-04-26 DIAGNOSIS — Z95828 Presence of other vascular implants and grafts: Secondary | ICD-10-CM

## 2021-04-26 LAB — CBC WITH DIFFERENTIAL (CANCER CENTER ONLY)
Abs Immature Granulocytes: 0.2 10*3/uL — ABNORMAL HIGH (ref 0.00–0.07)
Basophils Absolute: 0 10*3/uL (ref 0.0–0.1)
Basophils Relative: 1 %
Eosinophils Absolute: 0.1 10*3/uL (ref 0.0–0.5)
Eosinophils Relative: 2 %
HCT: 31.7 % — ABNORMAL LOW (ref 36.0–46.0)
Hemoglobin: 9.9 g/dL — ABNORMAL LOW (ref 12.0–15.0)
Immature Granulocytes: 3 %
Lymphocytes Relative: 13 %
Lymphs Abs: 0.9 10*3/uL (ref 0.7–4.0)
MCH: 25.1 pg — ABNORMAL LOW (ref 26.0–34.0)
MCHC: 31.2 g/dL (ref 30.0–36.0)
MCV: 80.5 fL (ref 80.0–100.0)
Monocytes Absolute: 0.7 10*3/uL (ref 0.1–1.0)
Monocytes Relative: 9 %
Neutro Abs: 5.2 10*3/uL (ref 1.7–7.7)
Neutrophils Relative %: 72 %
Platelet Count: 251 10*3/uL (ref 150–400)
RBC: 3.94 MIL/uL (ref 3.87–5.11)
RDW: 23.9 % — ABNORMAL HIGH (ref 11.5–15.5)
WBC Count: 7.1 10*3/uL (ref 4.0–10.5)
nRBC: 0 % (ref 0.0–0.2)

## 2021-04-26 LAB — CMP (CANCER CENTER ONLY)
ALT: 7 U/L (ref 0–44)
AST: 9 U/L — ABNORMAL LOW (ref 15–41)
Albumin: 3.2 g/dL — ABNORMAL LOW (ref 3.5–5.0)
Alkaline Phosphatase: 119 U/L (ref 38–126)
Anion gap: 13 (ref 5–15)
BUN: 5 mg/dL — ABNORMAL LOW (ref 8–23)
CO2: 23 mmol/L (ref 22–32)
Calcium: 9.6 mg/dL (ref 8.9–10.3)
Chloride: 102 mmol/L (ref 98–111)
Creatinine: 0.57 mg/dL (ref 0.44–1.00)
GFR, Estimated: >60 mL/min (ref >60)
Glucose, Bld: 103 mg/dL — ABNORMAL HIGH (ref 70–99)
Potassium: 3.4 mmol/L — ABNORMAL LOW (ref 3.5–5.1)
Sodium: 138 mmol/L (ref 135–145)
Total Bilirubin: 0.3 mg/dL (ref 0.3–1.2)
Total Protein: 7.8 g/dL (ref 6.5–8.1)

## 2021-04-26 LAB — TOTAL PROTEIN, URINE DIPSTICK: Protein, ur: 30 mg/dL — AB

## 2021-04-26 MED ORDER — DEXTROSE 5 % IV SOLN: Freq: Once | INTRAVENOUS | Status: AC

## 2021-04-26 MED ORDER — SODIUM CHLORIDE 0.9 % IV SOLN
2400.0000 mg/m2 | INTRAVENOUS | Status: DC
  Administered 2021-04-26: 4200 mg via INTRAVENOUS
  Filled 2021-04-26: qty 84

## 2021-04-26 MED ORDER — SODIUM CHLORIDE 0.9% FLUSH
10.0000 mL | INTRAVENOUS | Status: AC | PRN
  Administered 2021-04-26: 10 mL

## 2021-04-26 MED ORDER — PALONOSETRON HCL INJECTION 0.25 MG/5ML
0.2500 mg | Freq: Once | INTRAVENOUS | Status: AC
  Administered 2021-04-26: 0.25 mg via INTRAVENOUS
  Filled 2021-04-26: qty 5

## 2021-04-26 MED ORDER — DEXTROSE 5 % IV SOLN
400.0000 mg/m2 | Freq: Once | INTRAVENOUS | Status: AC
  Administered 2021-04-26: 696 mg via INTRAVENOUS
  Filled 2021-04-26: qty 34.8

## 2021-04-26 MED ORDER — SODIUM CHLORIDE 0.9 % IV SOLN
10.0000 mg | Freq: Once | INTRAVENOUS | Status: AC
  Administered 2021-04-26: 10 mg via INTRAVENOUS
  Filled 2021-04-26: qty 10

## 2021-04-26 MED ORDER — OXALIPLATIN CHEMO INJECTION 100 MG/20ML
85.0000 mg/m2 | Freq: Once | INTRAVENOUS | Status: AC
  Administered 2021-04-26: 150 mg via INTRAVENOUS
  Filled 2021-04-26: qty 20

## 2021-04-26 MED ORDER — SODIUM CHLORIDE 0.9 % IV SOLN
Freq: Once | INTRAVENOUS | Status: AC
Start: 2021-04-26 — End: 2021-04-26

## 2021-04-26 MED ORDER — SODIUM CHLORIDE 0.9 % IV SOLN
5.0000 mg/kg | Freq: Once | INTRAVENOUS | Status: AC
  Administered 2021-04-26: 350 mg via INTRAVENOUS
  Filled 2021-04-26: qty 14

## 2021-04-26 NOTE — Progress Notes (Signed)
[facility]   [phone] [phone]   Clinic Follow up Note   Patient Care Team: Pcp, No as PCP - General [name], MD as Consulting Physician (Oncology) [name], RN as Nurse Navigator (Oncology)  Date of Service:  04/26/2021  CHIEF COMPLAINT: f/u of metastatic colon cancer  CURRENT THERAPY:  First-line FOLFOX and bevacizumab, starting 03/16/21  ASSESSMENT & PLAN:  Kari Patel is a 70 y.o. female with   1. 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 Work up with EGD and colonoscopy on 12/07/20 revealed a large cecal mass. Biopsy confirmed adenocarcinoma.  -She underwent repeat CT CAP on 03/10/21 showing: 7.1 cm cecal mass; similar extensive peritoneal/omental nodularity; similar prominent and mildly enlarged retroperitoneal and mesenteric lymph nodes; numerous scattered bilateral pulmonary nodules; similar mild right-sided hydronephrosis. -She proceeded to biopsy of an omental nodule on 03/13/21 confirming metastatic adenocarcinoma, consistent with colon primary.  -she began first line FOLFOX 03/16/21, and bevacizumab added with C2 (03/29/21), she has been tolerating well except constipation. -FO showed: MS-stable, low mutation burden 4 Muts/Mb. KRAS (+), I reviewed with pt  -she is doing well except constipation issues. labs reviewed, overall adequate to proceed with C4 today. No need for Udenyca this cycle. -plan for restaging scan after 6 cycles. I will order at next visit.   2. Symptom management: Constipation, weight loss -secondary to FOLFOX -she has lost weight because of the constipation. (10 lbs since starting treatment) -she tried half dose of Miralax but developed diarrhea. I recommend switching back to colace daily and using a half dose of imodium if needed. -I also recommended she increase fiber in her diet or add a fiber supplement.  -She met  with our nutritionist on 04/14/21. -She has started supplementing with Ensure. -started her on KCL on 04/13/21.   3. Anemia, iron deficiency  -likely related to her colon cancer and iron deficiency -She has received IV iron Venofer 200 mg, most recently 04/03/21. Hgb improving.   4. Goal of care discussion  -The patient understands the goal of care is palliative. -she is full code now      PLAN:  -proceed with C4 FOLFOX and Beva, NO GCSF with this cycle  -labs, f/u, and C5 in 2 weeks, will order restaging scan on next visit  -she will start colace 1-2 times a day to prevent constipation    No problem-specific Assessment & Plan notes found for this encounter.   SUMMARY OF ONCOLOGIC HISTORY: Oncology History Overview Note  Cancer Staging Cancer of right colon [facility]) Staging form: Colon and Rectum - Neuroendocine Tumors, AJCC 8th Edition - Clinical stage from 12/13/2020: Stage IV (cTX, cN1, cM1) - Signed by [name], MD on 03/01/2021    Cancer of right colon [facility])  12/07/2020 Procedure   Colonoscopy  Impression: - non-bleeding internal hemorrhoids - diverticulosis in entire examined colon - one 9 mm polyp in ascending colon removed with hot snare - one 13 mm polyp in transverse colon removed with hot fnare - likely malignant partially-obstructing tumor in cecum.   12/07/2020 Pathology Results   FINAL PATHOLOGIC DIAGNOSIS  SMALL BOWEL, BIOPSIES - duodenal mucosa within normal limits GASTRIC BODY AND ATRIUM, BIOPSY - gastric antral and body-type mucosa with moderate chronic Helicobacter pylori gastritis ASCENDING COLON, POLYPECTOMY - tubular adenoma with prominent eosinophilic infiltrate CECUM, "MASS," BIOPSIES - moderately differentiated adenocarcinoma TRANSVERSE COLON, POLYPECTOMY - high grade dysplasia  involving tubulovillous adenoma - cauterized margins negative   12/13/2020 Cancer Staging   Staging form: Colon and Rectum - Neuroendocine Tumors, AJCC 8th Edition -  Clinical stage from 12/13/2020: Stage IV (cTX, cN1, cM1) - Signed by [name], MD on 03/01/2021   12/22/2020 Imaging   CT CAP  IMPRESSION: Large 7.5 cm cecal mass with surrounding nodularity/lymphadenopathy 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 The largest measures 6 mm in the RUL. Findings are nonspecific but can be followed per Fleischner criteria Hiatal hernia   03/01/2021 Initial Diagnosis   Cancer of right colon ([place])   03/10/2021 Imaging   CT CAP  IMPRESSION: 1. Large infiltrative cecal mass measuring approximately 7.1 cm, reflecting the known right-sided colonic neoplasm. 2. Similar extensive peritoneal/omental nodularity throughout the abdomen and pelvis with trace free fluid in the pelvis along the pericolic gutters, consistent with disease involvement. 3. Similar prominent and mildly enlarged retroperitoneal and mesenteric lymph nodes, most likely reflecting disease involvement. 4. Numerous scattered bilateral pulmonary nodules measuring up to 6 mm are similar to prior and nonspecific but suspicious for disease involvement. Attention on follow-up imaging suggested. 5. Similar mild right-sided hydronephrosis without hydroureter, with the distal ureter traversing peritoneal nodularity. Likely reflecting partial UPJ obstruction given lack of hydroureter. Attention on follow-up imaging suggested. Cholelithiasis without findings of acute cholecystitis. 6.  Aortic Atherosclerosis (ICD10-I70.0).   03/13/2021 Pathology Results   FINAL MICROSCOPIC DIAGNOSIS:   A. OMENTUM, NEEDLE CORE BIOPSY:  - Metastatic adenocarcinoma, consistent with clinical impression of a  colonic primary.  See comment     03/13/2021 Miscellaneous      03/16/2021 -  Chemotherapy    Patient is on Treatment Plan: COLORECTAL FOLFOX + BEVACIZUMAB Q14D          INTERVAL HISTORY:  Kari Patel is here for a follow up of metastatic colon cancer. She was last seen by me on 04/13/21. She presents to the clinic alone. She reports she is doing well aside from constipation. She notes she tried Miralax, at half dose, but experienced diarrhea instead. She notes she had a bowel movement 6x a day. She tried imodium but went back to constipation. She denies bone pain from Udenyca, any neuropathy symptoms, mouth sores. She reports she is feeling better overall since starting chemo.   All other systems were reviewed with the patient and are negative.  MEDICAL HISTORY:  Past Medical History:  Diagnosis Date   Anemia    Colon cancer ([place])    cecum   Heart murmur    Hypertension     SURGICAL HISTORY: Past Surgical History:  Procedure Laterality Date   ABDOMINAL HYSTERECTOMY     IR IMAGING GUIDED PORT INSERTION  03/13/2021    I have reviewed the social history and family history with the patient and they are unchanged from previous note.  ALLERGIES:  is allergic to demerol [meperidine hcl].  MEDICATIONS:  Current Outpatient Medications  Medication Sig Dispense Refill   lidocaine-prilocaine (EMLA) cream Apply 1 application topically as needed. 30 g 0   ondansetron (ZOFRAN) 8 MG tablet Take 1 tablet (8 mg total) by mouth every 8 (eight) hours as needed for nausea or vomiting. 20 tablet 1   prochlorperazine (COMPAZINE) 10 MG tablet Take 1 tablet (10 mg total) by mouth every 6 (six) hours as needed  for nausea or vomiting. 30 tablet 1   amLODipine (NORVASC) 10 MG tablet Take 10 mg by mouth daily.     lisinopril (ZESTRIL) 5 MG tablet Take 5 mg by mouth daily.     potassium chloride SA (KLOR-CON) 20 MEQ tablet Take 1 tablet (20 mEq total) by mouth 2 (two) times daily. 20 tablet 0   No current facility-administered  medications for this visit.    PHYSICAL EXAMINATION: ECOG PERFORMANCE STATUS: 1 - Symptomatic but completely ambulatory  Vitals:   04/26/21 1202  BP: (!) 150/68  Pulse: 92  Resp: 18  Temp: 98.5 F (36.9 C)  SpO2: 99%   Wt Readings from Last 3 Encounters:  04/26/21 146 lb 1.6 oz (66.3 kg)  04/13/21 150 lb 4.8 oz (68.2 kg)  03/29/21 156 lb 4.8 oz (70.9 kg)     GENERAL:alert, no distress and comfortable SKIN: skin color normal, no rashes or significant lesions EYES: normal, Conjunctiva are pink and non-injected, sclera clear  NEURO: alert & oriented x 3 with fluent speech  LABORATORY DATA:  I have reviewed the data as listed CBC Latest Ref Rng & Units 04/26/2021 04/13/2021 03/29/2021  WBC 4.0 - 10.5 K/uL 7.1 3.6(L) 2.8(L)  Hemoglobin 12.0 - 15.0 g/dL 9.9(L) 9.4(L) 7.9(L)  Hematocrit 36.0 - 46.0 % 31.7(L) 30.3(L) 26.1(L)  Platelets 150 - 400 K/uL 251 298 304     CMP Latest Ref Rng & Units 04/26/2021 04/13/2021 03/29/2021  Glucose 70 - 99 mg/dL 103(H) 128(H) 136(H)  BUN 8 - 23 mg/dL 5(L) 7(L) 5(L)  Creatinine 0.44 - 1.00 mg/dL 0.57 0.64 0.74  Sodium 135 - 145 mmol/L 138 139 141  Potassium 3.5 - 5.1 mmol/L 3.4(L) 3.3(L) 3.7  Chloride 98 - 111 mmol/L 102 103 105  CO2 22 - 32 mmol/L $RemoveB'23 23 23  'AFxeBRud$ Calcium 8.9 - 10.3 mg/dL 9.6 9.8 9.3  Total Protein 6.5 - 8.1 g/dL 7.8 8.2(H) 7.7  Total Bilirubin 0.3 - 1.2 mg/dL 0.3 0.4 0.2(L)  Alkaline Phos 38 - 126 U/L 119 88 73  AST 15 - 41 U/L 9(L) 9(L) 16  ALT 0 - 44 U/L $Remo'7 6 11      'GPEYD$ RADIOGRAPHIC STUDIES: I have personally reviewed the radiological images as listed and agreed with the findings in the report. No results found.    No orders of the defined types were placed in this encounter.  All questions were answered. The patient knows to call the clinic with any problems, questions or concerns. No barriers to learning was detected. The total time spent in the appointment was 30 minutes.     [name], MD 04/26/2021   I, Wilburn Mylar, am acting as scribe for [name], MD.   I have reviewed the above documentation for accuracy and completeness, and I agree with the above.

## 2021-04-27 ENCOUNTER — Telehealth: Payer: Self-pay | Admitting: Hematology

## 2021-04-27 NOTE — Telephone Encounter (Signed)
No los 9/14

## 2021-04-28 ENCOUNTER — Other Ambulatory Visit: Payer: Self-pay

## 2021-04-28 ENCOUNTER — Inpatient Hospital Stay: Payer: Medicare HMO

## 2021-04-28 DIAGNOSIS — D500 Iron deficiency anemia secondary to blood loss (chronic): Secondary | ICD-10-CM

## 2021-04-28 MED ORDER — HEPARIN SOD (PORK) LOCK FLUSH 100 UNIT/ML IV SOLN: 500.0000 [IU] | Freq: Once | INTRAVENOUS | Status: DC | PRN

## 2021-04-28 MED ORDER — SODIUM CHLORIDE 0.9% FLUSH: 10.0000 mL | Freq: Once | INTRAVENOUS | Status: DC | PRN

## 2021-05-02 ENCOUNTER — Telehealth: Payer: Self-pay | Admitting: Surgery

## 2021-05-02 NOTE — Telephone Encounter (Signed)
Pt had left a message stating that she was having diarrhea after eating.  However, according to her chart, immodium caused her to become constipated before.  I notified [name], and she stated that if the pt has more than 3 loose BM in a day, then take a half dose of immodium, but no more than two doses in a day.  Also, [name] wants to make sure the pt is drinking plenty of fluids.  If the diarrhea is mild, the pt can wait on taking immodium as long as the pt is drinking plenty of fluids.  I left the pt a voicemail with these instructions and told her to call our office back if she had any questions.

## 2021-05-10 MED FILL — Dexamethasone Sodium Phosphate Inj 100 MG/10ML: INTRAMUSCULAR | Qty: 1 | Status: AC

## 2021-05-11 ENCOUNTER — Inpatient Hospital Stay: Payer: Medicare HMO

## 2021-05-11 ENCOUNTER — Other Ambulatory Visit: Payer: Self-pay

## 2021-05-11 ENCOUNTER — Inpatient Hospital Stay: Payer: Medicare HMO | Admitting: Hematology

## 2021-05-11 ENCOUNTER — Encounter: Payer: Self-pay | Admitting: Hematology

## 2021-05-11 VITALS — BP 149/73 | HR 90 | Temp 98.0°F | Resp 18 | Ht 63.0 in | Wt 149.2 lb

## 2021-05-11 DIAGNOSIS — Z5112 Encounter for antineoplastic immunotherapy: Secondary | ICD-10-CM | POA: Diagnosis not present

## 2021-05-11 DIAGNOSIS — D500 Iron deficiency anemia secondary to blood loss (chronic): Secondary | ICD-10-CM

## 2021-05-11 DIAGNOSIS — C182 Malignant neoplasm of ascending colon: Secondary | ICD-10-CM

## 2021-05-11 LAB — CBC WITH DIFFERENTIAL (CANCER CENTER ONLY)
Abs Immature Granulocytes: 0.04 10*3/uL (ref 0.00–0.07)
Basophils Absolute: 0 10*3/uL (ref 0.0–0.1)
Basophils Relative: 1 %
Eosinophils Absolute: 0 10*3/uL (ref 0.0–0.5)
Eosinophils Relative: 1 %
HCT: 29.5 % — ABNORMAL LOW (ref 36.0–46.0)
Hemoglobin: 9.3 g/dL — ABNORMAL LOW (ref 12.0–15.0)
Immature Granulocytes: 1 %
Lymphocytes Relative: 14 %
Lymphs Abs: 0.7 10*3/uL (ref 0.7–4.0)
MCH: 25.5 pg — ABNORMAL LOW (ref 26.0–34.0)
MCHC: 31.5 g/dL (ref 30.0–36.0)
MCV: 81 fL (ref 80.0–100.0)
Monocytes Absolute: 1.1 10*3/uL — ABNORMAL HIGH (ref 0.1–1.0)
Monocytes Relative: 23 %
Neutro Abs: 2.7 10*3/uL (ref 1.7–7.7)
Neutrophils Relative %: 60 %
Platelet Count: 378 10*3/uL (ref 150–400)
RBC: 3.64 MIL/uL — ABNORMAL LOW (ref 3.87–5.11)
RDW: 23 % — ABNORMAL HIGH (ref 11.5–15.5)
WBC Count: 4.5 10*3/uL (ref 4.0–10.5)
nRBC: 0 % (ref 0.0–0.2)

## 2021-05-11 LAB — CMP (CANCER CENTER ONLY)
ALT: <5 U/L (ref 0–44)
AST: 9 U/L — ABNORMAL LOW (ref 15–41)
Albumin: 2.7 g/dL — ABNORMAL LOW (ref 3.5–5.0)
Alkaline Phosphatase: 89 U/L (ref 38–126)
Anion gap: 12 (ref 5–15)
BUN: 4 mg/dL — ABNORMAL LOW (ref 8–23)
CO2: 25 mmol/L (ref 22–32)
Calcium: 9.4 mg/dL (ref 8.9–10.3)
Chloride: 104 mmol/L (ref 98–111)
Creatinine: 0.58 mg/dL (ref 0.44–1.00)
GFR, Estimated: >60 mL/min (ref >60)
Glucose, Bld: 101 mg/dL — ABNORMAL HIGH (ref 70–99)
Potassium: 3.2 mmol/L — ABNORMAL LOW (ref 3.5–5.1)
Sodium: 141 mmol/L (ref 135–145)
Total Bilirubin: 0.3 mg/dL (ref 0.3–1.2)
Total Protein: 7.6 g/dL (ref 6.5–8.1)

## 2021-05-11 LAB — CEA (IN HOUSE-CHCC): CEA (CHCC-In House): 12.14 ng/mL — ABNORMAL HIGH (ref 0.00–5.00)

## 2021-05-11 LAB — TOTAL PROTEIN, URINE DIPSTICK: Protein, ur: 100 mg/dL — AB

## 2021-05-11 MED ORDER — PALONOSETRON HCL INJECTION 0.25 MG/5ML
0.2500 mg | Freq: Once | INTRAVENOUS | Status: AC
  Administered 2021-05-11: 0.25 mg via INTRAVENOUS

## 2021-05-11 MED ORDER — SODIUM CHLORIDE 0.9% FLUSH
10.0000 mL | Freq: Once | INTRAVENOUS | Status: AC | PRN
  Administered 2021-05-11: 10 mL

## 2021-05-11 MED ORDER — PALONOSETRON HCL INJECTION 0.25 MG/5ML
INTRAVENOUS | Status: AC
  Filled 2021-05-11: qty 5

## 2021-05-11 MED ORDER — LEUCOVORIN CALCIUM INJECTION 350 MG
400.0000 mg/m2 | Freq: Once | INTRAVENOUS | Status: AC
  Administered 2021-05-11: 696 mg via INTRAVENOUS
  Filled 2021-05-11: qty 34.8

## 2021-05-11 MED ORDER — SODIUM CHLORIDE 0.9 % IV SOLN
10.0000 mg | Freq: Once | INTRAVENOUS | Status: AC
  Administered 2021-05-11: 10 mg via INTRAVENOUS
  Filled 2021-05-11: qty 10

## 2021-05-11 MED ORDER — PANTOPRAZOLE SODIUM 40 MG PO TBEC: 40.0000 mg | DELAYED_RELEASE_TABLET | Freq: Every day | ORAL | 0 refills | Status: DC

## 2021-05-11 MED ORDER — SODIUM CHLORIDE 0.9 % IV SOLN
5.0000 mg/kg | Freq: Once | INTRAVENOUS | Status: AC
  Administered 2021-05-11: 350 mg via INTRAVENOUS
  Filled 2021-05-11: qty 14

## 2021-05-11 MED ORDER — SUCRALFATE 1 G PO TABS: 1.0000 g | ORAL_TABLET | Freq: Three times a day (TID) | ORAL | 0 refills | Status: DC

## 2021-05-11 MED ORDER — SODIUM CHLORIDE 0.9 % IV SOLN
2400.0000 mg/m2 | INTRAVENOUS | Status: DC
  Administered 2021-05-11: 4200 mg via INTRAVENOUS
  Filled 2021-05-11: qty 84

## 2021-05-11 MED ORDER — DEXTROSE 5 % IV SOLN: Freq: Once | INTRAVENOUS | Status: AC

## 2021-05-11 MED ORDER — OXALIPLATIN CHEMO INJECTION 100 MG/20ML
85.0000 mg/m2 | Freq: Once | INTRAVENOUS | Status: AC
  Administered 2021-05-11: 150 mg via INTRAVENOUS
  Filled 2021-05-11: qty 20

## 2021-05-11 MED ORDER — SODIUM CHLORIDE 0.9 % IV SOLN: Freq: Once | INTRAVENOUS | Status: AC

## 2021-05-11 NOTE — Patient Instructions (Addendum)
[facility]   If you are unable to come on Saturday, please call us that day to let us know at [phone]. Discharge Instructions: Thank you for choosing [facility] to provide your oncology and hematology care.   If you have a lab appointment with the [facility], please go directly to the [facility] and check in at the registration area.   Wear comfortable clothing and clothing appropriate for easy access to any Portacath or PICC line.   We strive to give you quality time with your provider. You may need to reschedule your appointment if you arrive late (15 or more minutes).  Arriving late affects you and other patients whose appointments are after yours.  Also, if you miss three or more appointments without notifying the office, you may be dismissed from the clinic at the provider's discretion.      For prescription refill requests, have your pharmacy contact our office and allow 72 hours for refills to be completed.    Today you received the following chemotherapy and/or immunotherapy agents: Bevacizumab (Avastin), Oxaliplatin, Leucovorin, Fluorouracil (Adrucil)      To help prevent nausea and vomiting after your treatment, we encourage you to take your nausea medication as directed.  BELOW ARE SYMPTOMS THAT SHOULD BE REPORTED IMMEDIATELY: *FEVER GREATER THAN 100.4 F (38 C) OR HIGHER *CHILLS OR SWEATING *NAUSEA AND VOMITING THAT IS NOT CONTROLLED WITH YOUR NAUSEA MEDICATION *UNUSUAL SHORTNESS OF BREATH *UNUSUAL BRUISING OR BLEEDING *URINARY PROBLEMS (pain or burning when urinating, or frequent urination) *BOWEL PROBLEMS (unusual diarrhea, constipation, pain near the anus) TENDERNESS IN MOUTH AND THROAT WITH OR WITHOUT PRESENCE OF ULCERS (sore throat, sores in mouth, or a toothache) UNUSUAL RASH, SWELLING OR PAIN  UNUSUAL VAGINAL DISCHARGE OR ITCHING   Items with * indicate a potential emergency and should be followed up as soon as  possible or go to the Emergency Department if any problems should occur.  Please show the CHEMOTHERAPY ALERT CARD or IMMUNOTHERAPY ALERT CARD at check-in to the Emergency Department and triage nurse.  Should you have questions after your visit or need to cancel or reschedule your appointment, please contact [facility]  Dept: [phone]  and follow the prompts.  Office hours are 8:00 a.m. to 4:30 p.m. Monday - Friday. Please note that voicemails left after 4:00 p.m. may not be returned until the following business day.  We are closed weekends and major holidays. You have access to a nurse at all times for urgent questions. Please call the main number to the clinic Dept: [phone] and follow the prompts.   For any non-urgent questions, you may also contact your provider using MyChart. We now offer e-Visits for anyone 80 and older to request care online for non-urgent symptoms. For details visit [email or web address].   Also download the MyChart app! Go to the app store, search "MyChart", open the app, select [facility], and log in with your MyChart username and password.  Due to Covid, a mask is required upon entering the hospital/clinic. If you do not have a mask, one will be given to you upon arrival. For doctor visits, patients may have 1 support person aged 33 or older with them. For treatment visits, patients cannot have anyone with them due to current Covid guidelines and our immunocompromised population.  The chemotherapy medication bag should finish at 46 hours, 96 hours, or 7 days. For example, if your pump is scheduled for 46 hours and  it was put on at 4:00 p.m., it should finish at 2:00 p.m. the day it is scheduled to come off regardless of your appointment time.     Estimated time to finish at 12:10 on Saturday, October 1st.  .   If the display on your pump reads "Low Volume" and it is beeping, take the batteries out of the pump and come to the cancer  center for it to be taken off.   If the pump alarms go off prior to the pump reading "Low Volume" then call [phone] and someone can assist you.  If the plunger comes out and the chemotherapy medication is leaking out, please use your home chemo spill kit to clean up the spill. Do NOT use paper towels or other household products.  If you have problems or questions regarding your pump, please call either [phone] (24 hours a day) or the cancer center Monday-Friday 8:00 a.m.- 4:30 p.m. at the clinic number and we will assist you. If you are unable to get assistance, then go to the nearest Emergency Department and ask the staff to contact the IV team for assistance.

## 2021-05-11 NOTE — Progress Notes (Signed)
Per [name] - okay to treat with elevated urine protein.

## 2021-05-11 NOTE — Progress Notes (Signed)
Kari Patel   Telephone:([phone] Fax:(336) [phone]   Clinic Follow up Note   Patient Care Team: Pcp, No as PCP - General [name], MD as Consulting Physician (Oncology) [name], RN as Nurse Navigator (Oncology)  Date of Service:  05/11/2021  CHIEF COMPLAINT: f/u of metastatic colon cancer  CURRENT THERAPY:  First-line FOLFOX and bevacizumab, starting 03/16/21  ASSESSMENT & PLAN:  Kari Patel is a 70 y.o. female with   1. 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 Work up with EGD and colonoscopy on 12/07/20 revealed a large cecal mass. Biopsy confirmed adenocarcinoma.  -She underwent repeat CT CAP on 03/10/21 showing: 7.1 cm cecal mass; similar extensive peritoneal/omental nodularity; similar prominent and mildly enlarged retroperitoneal and mesenteric lymph nodes; numerous scattered bilateral pulmonary nodules; similar mild right-sided hydronephrosis. -She proceeded to biopsy of an omental nodule on 03/13/21 confirming metastatic adenocarcinoma, consistent with colon primary.  -she began first line FOLFOX 03/16/21, and bevacizumab added with C2 (03/29/21), she has been tolerating well except constipation. -FO showed: MS-stable, low mutation burden 4 Muts/Mb. KRAS (+) -she is doing well with chemo except constipation and diarrhea issues. labs reviewed, overall adequate to proceed with C4 today. No need for Udenyca this cycle. -plan for restaging scan after 6 cycles; I ordered today.   2. Symptom management: Constipation/diarrhea, weight loss -secondary to FOLFOX and her underline colon cancer  -she has lost weight because of the constipation. (10 lbs since starting treatment) -following C4, she developed diarrhea and gastric pain for a day on day 4. I will prescribe protonix and sulcrafate for her to try. -She met with our nutritionist on 04/14/21. -She has started  supplementing with Ensure. -started her on KCL on 04/13/21.   3. Anemia, iron deficiency  -likely related to her colon cancer and iron deficiency -She has received IV iron Venofer 200 mg, most recently 04/03/21. Hgb improving.   4. Goal of care discussion  -The patient understands the goal of care is palliative. -she is full code now      PLAN:  -proceed with C5 FOLFOX and Beva, NO GCSF with this cycle  -I prescribed protonix and carafate for her to try. -she will also try fiber supplement and probiotis for her BM issues  -labs, f/u, and C6 in 2 weeks -I ordered restaging scan to be done in 4 weeks, prior to C7.   No problem-specific Assessment & Plan notes found for this encounter.   SUMMARY OF ONCOLOGIC HISTORY: Oncology History Overview Note  Cancer Staging Cancer of right colon [facility]) Staging form: Colon and Rectum - Neuroendocine Tumors, AJCC 8th Edition - Clinical stage from 12/13/2020: Stage IV (cTX, cN1, cM1) - Signed by [name], MD on 03/01/2021    Cancer of right colon [facility])  12/07/2020 Procedure   Colonoscopy  Impression: - non-bleeding internal hemorrhoids - diverticulosis in entire examined colon - one 9 mm polyp in ascending colon removed with hot snare - one 13 mm polyp in transverse colon removed with hot fnare - likely malignant partially-obstructing tumor in cecum.   12/07/2020 Pathology Results   FINAL PATHOLOGIC DIAGNOSIS  SMALL BOWEL, BIOPSIES - duodenal mucosa within normal limits GASTRIC BODY AND ATRIUM, BIOPSY - gastric antral and body-type mucosa with moderate chronic Helicobacter pylori gastritis ASCENDING COLON, POLYPECTOMY - tubular adenoma with prominent eosinophilic infiltrate CECUM, "MASS," BIOPSIES - moderately differentiated adenocarcinoma TRANSVERSE COLON, POLYPECTOMY - high grade  dysplasia involving tubulovillous adenoma - cauterized margins negative   12/13/2020 Cancer Staging   Staging form: Colon and Rectum - Neuroendocine Tumors,  AJCC 8th Edition - Clinical stage from 12/13/2020: Stage IV (cTX, cN1, cM1) - Signed by [name], MD on 03/01/2021   12/22/2020 Imaging   CT CAP  IMPRESSION: Large 7.5 cm cecal mass with surrounding nodularity/lymphadenopathy 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 The largest measures 6 mm in the RUL. Findings are nonspecific but can be followed per Fleischner criteria Hiatal hernia   03/01/2021 Initial Diagnosis   Cancer of right colon ([place])   03/10/2021 Imaging   CT CAP  IMPRESSION: 1. Large infiltrative cecal mass measuring approximately 7.1 cm, reflecting the known right-sided colonic neoplasm. 2. Similar extensive peritoneal/omental nodularity throughout the abdomen and pelvis with trace free fluid in the pelvis along the pericolic gutters, consistent with disease involvement. 3. Similar prominent and mildly enlarged retroperitoneal and mesenteric lymph nodes, most likely reflecting disease involvement. 4. Numerous scattered bilateral pulmonary nodules measuring up to 6 mm are similar to prior and nonspecific but suspicious for disease involvement. Attention on follow-up imaging suggested. 5. Similar mild right-sided hydronephrosis without hydroureter, with the distal ureter traversing peritoneal nodularity. Likely reflecting partial UPJ obstruction given lack of hydroureter. Attention on follow-up imaging suggested. Cholelithiasis without findings of acute cholecystitis. 6.  Aortic Atherosclerosis (ICD10-I70.0).   03/13/2021 Pathology Results   FINAL MICROSCOPIC DIAGNOSIS:   A. OMENTUM, NEEDLE CORE BIOPSY:  - Metastatic adenocarcinoma, consistent with clinical impression of a  colonic  primary.  See comment    03/13/2021 Miscellaneous      03/16/2021 -  Chemotherapy   Patient is on Treatment Plan : COLORECTAL FOLFOX + Bevacizumab q14d        INTERVAL HISTORY:  Kari Patel is here for a follow up of metastatic colon cancer. She was last seen by me on 04/26/21. She presents to the clinic alone. She reports diarrhea for one day on day 4. She notes she tried imodium but had cramping. She reports she recovered well after that one day. She denies acid reflux after treatment.   All other systems were reviewed with the patient and are negative.  MEDICAL HISTORY:  Past Medical History:  Diagnosis Date   Anemia    Colon cancer ([place])    cecum   Heart murmur    Hypertension     SURGICAL HISTORY: Past Surgical History:  Procedure Laterality Date   ABDOMINAL HYSTERECTOMY     IR IMAGING GUIDED PORT INSERTION  03/13/2021    I have reviewed the social history and family history with the patient and they are unchanged from previous note.  ALLERGIES:  is allergic to demerol [meperidine hcl].  MEDICATIONS:  Current Outpatient Medications  Medication Sig Dispense Refill   lidocaine-prilocaine (EMLA) cream Apply 1 application topically as needed. 30 g 0   ondansetron (ZOFRAN) 8 MG tablet Take 1 tablet (8 mg total) by mouth every 8 (eight) hours as needed for nausea or vomiting. 20 tablet 1   pantoprazole (PROTONIX) 40 MG tablet Take 1 tablet (40 mg total) by mouth daily. 30 tablet 0   prochlorperazine (COMPAZINE) 10 MG tablet Take 1 tablet (10 mg total) by mouth every 6 (six) hours as needed for nausea or vomiting. 30 tablet 1   sucralfate (  CARAFATE) 1 g tablet Take 1 tablet (1 g total) by mouth 4 (four) times daily -  with meals and at bedtime. 60 tablet 0   amLODipine (NORVASC) 10 MG tablet Take 10 mg by mouth daily.     lisinopril (ZESTRIL) 5 MG tablet Take 5 mg by mouth daily.     potassium chloride SA (KLOR-CON) 20 MEQ tablet Take 1 tablet (20 mEq total) by mouth 2 (two)  times daily. 20 tablet 0   No current facility-administered medications for this visit.   Facility-Administered Medications Ordered in Other Visits  Medication Dose Route Frequency Provider Last Rate Last Admin   0.9 %  sodium chloride infusion   Intravenous Once [name], MD       bevacizumab-bvzr (ZIRABEV) 350 mg in sodium chloride 0.9 % 100 mL chemo infusion  5 mg/kg (Treatment Plan Recorded) Intravenous Once [name], MD       dexamethasone (DECADRON) 10 mg in sodium chloride 0.9 % 50 mL IVPB  10 mg Intravenous Once [name], MD       dextrose 5 % solution   Intravenous Once [name], MD       fluorouracil (ADRUCIL) 4,200 mg in sodium chloride 0.9 % 66 mL chemo infusion  2,400 mg/m2 (Treatment Plan Recorded) Intravenous 1 day or 1 dose [name], MD       leucovorin 696 mg in dextrose 5 % 250 mL infusion  400 mg/m2 (Treatment Plan Recorded) Intravenous Once [name], MD       oxaliplatin (ELOXATIN) 150 mg in dextrose 5 % 500 mL chemo infusion  85 mg/m2 (Treatment Plan Recorded) Intravenous Once [name], MD       palonosetron (ALOXI) injection 0.25 mg  0.25 mg Intravenous Once [name], MD        PHYSICAL EXAMINATION: ECOG PERFORMANCE STATUS: 1 - Symptomatic but completely ambulatory  Vitals:   05/11/21 1011  BP: (!) 149/73  Pulse: 90  Resp: 18  Temp: 98 F (36.7 C)  SpO2: 98%   Wt Readings from Last 3 Encounters:  05/11/21 149 lb 3.2 oz (67.7 kg)  04/26/21 146 lb 1.6 oz (66.3 kg)  04/13/21 150 lb 4.8 oz (68.2 kg)     GENERAL:alert, no distress and comfortable SKIN: skin color normal, no rashes or significant lesions EYES: normal, Conjunctiva are pink and non-injected, sclera clear  NEURO: alert & oriented x 3 with fluent speech  LABORATORY DATA:  I have reviewed the data as listed CBC Latest Ref Rng & Units 05/11/2021 04/26/2021 04/13/2021  WBC 4.0 - 10.5 K/uL 4.5 7.1 3.6(L)  Hemoglobin 12.0 - 15.0 g/dL 9.3(L) 9.9(L) 9.4(L)  Hematocrit 36.0 - 46.0 % 29.5(L) 31.7(L)  30.3(L)  Platelets 150 - 400 K/uL 378 251 298     CMP Latest Ref Rng & Units 05/11/2021 04/26/2021 04/13/2021  Glucose 70 - 99 mg/dL 101(H) 103(H) 128(H)  BUN 8 - 23 mg/dL 4(L) 5(L) 7(L)  Creatinine 0.44 - 1.00 mg/dL 0.58 0.57 0.64  Sodium 135 - 145 mmol/L 141 138 139  Potassium 3.5 - 5.1 mmol/L 3.2(L) 3.4(L) 3.3(L)  Chloride 98 - 111 mmol/L 104 102 103  CO2 22 - 32 mmol/L _0 Calcium 8.9 - 10.3 mg/dL 9.4 9.6 9.8  Total Protein 6.5 - 8.1 g/dL 7.6 7.8 8.2(H)  Total Bilirubin 0.3 - 1.2 mg/dL 0.3 0.3 0.4  Alkaline Phos 38 - 126 U/L 89 119 88  AST 15 - 41 U/L 9(L) 9(L) 9(L)  ALT  0 - 44 U/L <_0 RADIOGRAPHIC STUDIES: I have personally reviewed the radiological images as listed and agreed with the findings in the report. No results found.    Orders Placed This Encounter  Procedures   CT CHEST ABDOMEN PELVIS W CONTRAST    Standing Status:   Future    Standing Expiration Date:   05/11/2022    Order Specific Question:   Preferred imaging location?    Answer:   [facility]    Order Specific Question:   Is Oral Contrast requested for this exam?    Answer:   Yes, Per Radiology protocol   All questions were answered. The patient knows to call the clinic with any problems, questions or concerns. No barriers to learning was detected. The total time spent in the appointment was 30 minutes.     [name], MD 05/11/2021   I, Wilburn Mylar, am acting as scribe for [name], MD.   I have reviewed the above documentation for accuracy and completeness, and I agree with the above.

## 2021-05-13 ENCOUNTER — Other Ambulatory Visit: Payer: Self-pay

## 2021-05-13 ENCOUNTER — Inpatient Hospital Stay: Payer: Medicare HMO | Attending: Hematology

## 2021-05-13 VITALS — BP 134/77 | HR 84 | Temp 97.6°F | Resp 16

## 2021-05-13 DIAGNOSIS — R634 Abnormal weight loss: Secondary | ICD-10-CM | POA: Diagnosis not present

## 2021-05-13 DIAGNOSIS — C182 Malignant neoplasm of ascending colon: Secondary | ICD-10-CM | POA: Insufficient documentation

## 2021-05-13 DIAGNOSIS — Z5112 Encounter for antineoplastic immunotherapy: Secondary | ICD-10-CM | POA: Insufficient documentation

## 2021-05-13 DIAGNOSIS — D509 Iron deficiency anemia, unspecified: Secondary | ICD-10-CM | POA: Diagnosis not present

## 2021-05-13 DIAGNOSIS — Z5111 Encounter for antineoplastic chemotherapy: Secondary | ICD-10-CM | POA: Diagnosis present

## 2021-05-13 DIAGNOSIS — Z5189 Encounter for other specified aftercare: Secondary | ICD-10-CM | POA: Insufficient documentation

## 2021-05-13 DIAGNOSIS — Z452 Encounter for adjustment and management of vascular access device: Secondary | ICD-10-CM | POA: Insufficient documentation

## 2021-05-13 MED ORDER — HEPARIN SOD (PORK) LOCK FLUSH 100 UNIT/ML IV SOLN
500.0000 [IU] | Freq: Once | INTRAVENOUS | Status: AC | PRN
  Administered 2021-05-13: 500 [IU]

## 2021-05-13 MED ORDER — SODIUM CHLORIDE 0.9% FLUSH
10.0000 mL | INTRAVENOUS | Status: DC | PRN
  Administered 2021-05-13: 10 mL

## 2021-05-18 ENCOUNTER — Other Ambulatory Visit: Payer: Self-pay | Admitting: Hematology

## 2021-05-24 MED FILL — Dexamethasone Sodium Phosphate Inj 100 MG/10ML: INTRAMUSCULAR | Qty: 1 | Status: AC

## 2021-05-25 ENCOUNTER — Inpatient Hospital Stay: Payer: Medicare HMO | Admitting: Hematology

## 2021-05-25 ENCOUNTER — Encounter: Payer: Self-pay | Admitting: Hematology

## 2021-05-25 ENCOUNTER — Other Ambulatory Visit: Payer: Self-pay

## 2021-05-25 ENCOUNTER — Inpatient Hospital Stay: Payer: Medicare HMO

## 2021-05-25 VITALS — BP 161/86 | HR 85 | Temp 98.0°F | Resp 18 | Ht 63.0 in | Wt 134.6 lb

## 2021-05-25 VITALS — BP 160/73

## 2021-05-25 DIAGNOSIS — C182 Malignant neoplasm of ascending colon: Secondary | ICD-10-CM

## 2021-05-25 DIAGNOSIS — Z95828 Presence of other vascular implants and grafts: Secondary | ICD-10-CM

## 2021-05-25 DIAGNOSIS — Z5112 Encounter for antineoplastic immunotherapy: Secondary | ICD-10-CM | POA: Diagnosis not present

## 2021-05-25 LAB — CBC WITH DIFFERENTIAL (CANCER CENTER ONLY)
Abs Immature Granulocytes: 0 10*3/uL (ref 0.00–0.07)
Basophils Absolute: 0 10*3/uL (ref 0.0–0.1)
Basophils Relative: 1 %
Eosinophils Absolute: 0 10*3/uL (ref 0.0–0.5)
Eosinophils Relative: 2 %
HCT: 30.3 % — ABNORMAL LOW (ref 36.0–46.0)
Hemoglobin: 9.5 g/dL — ABNORMAL LOW (ref 12.0–15.0)
Immature Granulocytes: 0 %
Lymphocytes Relative: 35 %
Lymphs Abs: 0.8 10*3/uL (ref 0.7–4.0)
MCH: 26.5 pg (ref 26.0–34.0)
MCHC: 31.4 g/dL (ref 30.0–36.0)
MCV: 84.6 fL (ref 80.0–100.0)
Monocytes Absolute: 0.4 10*3/uL (ref 0.1–1.0)
Monocytes Relative: 18 %
Neutro Abs: 1 10*3/uL — ABNORMAL LOW (ref 1.7–7.7)
Neutrophils Relative %: 44 %
Platelet Count: 231 10*3/uL (ref 150–400)
RBC: 3.58 MIL/uL — ABNORMAL LOW (ref 3.87–5.11)
RDW: 22.3 % — ABNORMAL HIGH (ref 11.5–15.5)
WBC Count: 2.2 10*3/uL — ABNORMAL LOW (ref 4.0–10.5)
nRBC: 0 % (ref 0.0–0.2)

## 2021-05-25 LAB — CMP (CANCER CENTER ONLY)
ALT: 9 U/L (ref 0–44)
AST: 12 U/L — ABNORMAL LOW (ref 15–41)
Albumin: 3.2 g/dL — ABNORMAL LOW (ref 3.5–5.0)
Alkaline Phosphatase: 74 U/L (ref 38–126)
Anion gap: 10 (ref 5–15)
BUN: 5 mg/dL — ABNORMAL LOW (ref 8–23)
CO2: 27 mmol/L (ref 22–32)
Calcium: 9.1 mg/dL (ref 8.9–10.3)
Chloride: 101 mmol/L (ref 98–111)
Creatinine: 0.41 mg/dL — ABNORMAL LOW (ref 0.44–1.00)
GFR, Estimated: >60 mL/min (ref >60)
Glucose, Bld: 101 mg/dL — ABNORMAL HIGH (ref 70–99)
Potassium: 3.4 mmol/L — ABNORMAL LOW (ref 3.5–5.1)
Sodium: 138 mmol/L (ref 135–145)
Total Bilirubin: 0.3 mg/dL (ref 0.3–1.2)
Total Protein: 7.8 g/dL (ref 6.5–8.1)

## 2021-05-25 LAB — TOTAL PROTEIN, URINE DIPSTICK: Protein, ur: 100 mg/dL — AB

## 2021-05-25 MED ORDER — LEUCOVORIN CALCIUM INJECTION 350 MG
400.0000 mg/m2 | Freq: Once | INTRAMUSCULAR | Status: AC
  Administered 2021-05-25: 696 mg via INTRAVENOUS
  Filled 2021-05-25: qty 34.8

## 2021-05-25 MED ORDER — BEVACIZUMAB-BVZR CHEMO INJECTION 400 MG/16ML
5.0000 mg/kg | Freq: Once | INTRAVENOUS | Status: AC
  Administered 2021-05-25: 300 mg via INTRAVENOUS
  Filled 2021-05-25: qty 12

## 2021-05-25 MED ORDER — DEXTROSE 5 % IV SOLN: Freq: Once | INTRAVENOUS | Status: AC

## 2021-05-25 MED ORDER — OXALIPLATIN CHEMO INJECTION 100 MG/20ML
85.0000 mg/m2 | Freq: Once | INTRAVENOUS | Status: AC
  Administered 2021-05-25: 150 mg via INTRAVENOUS
  Filled 2021-05-25: qty 20

## 2021-05-25 MED ORDER — SODIUM CHLORIDE 0.9 % IV SOLN: Freq: Once | INTRAVENOUS | Status: AC

## 2021-05-25 MED ORDER — SODIUM CHLORIDE 0.9 % IV SOLN
10.0000 mg | Freq: Once | INTRAVENOUS | Status: AC
  Administered 2021-05-25: 10 mg via INTRAVENOUS
  Filled 2021-05-25: qty 10

## 2021-05-25 MED ORDER — PALONOSETRON HCL INJECTION 0.25 MG/5ML: 0.2500 mg | Freq: Once | INTRAVENOUS | Status: AC

## 2021-05-25 MED ORDER — SODIUM CHLORIDE 0.9 % IV SOLN
2400.0000 mg/m2 | INTRAVENOUS | Status: DC
  Administered 2021-05-25: 4200 mg via INTRAVENOUS
  Filled 2021-05-25: qty 84

## 2021-05-25 MED ORDER — SUCRALFATE 1 GM/10ML PO SUSP: 1.0000 g | Freq: Three times a day (TID) | ORAL | 0 refills | Status: DC

## 2021-05-25 MED ORDER — PALONOSETRON HCL INJECTION 0.25 MG/5ML
INTRAVENOUS | Status: AC
  Administered 2021-05-25: 0.25 mg via INTRAVENOUS
  Filled 2021-05-25: qty 5

## 2021-05-25 MED ORDER — SODIUM CHLORIDE 0.9% FLUSH
10.0000 mL | INTRAVENOUS | Status: DC | PRN
  Administered 2021-05-25: 10 mL via INTRAVENOUS

## 2021-05-25 NOTE — Patient Instructions (Signed)

## 2021-05-25 NOTE — Progress Notes (Signed)
Per [name], "OK To Treat w/ANC of 1.0 today."

## 2021-05-25 NOTE — Progress Notes (Signed)
[place]   Telephone:(336) [phone] Fax:(336) [phone]   Clinic Follow up Note   Patient Care Team: Pcp, No as PCP - General [name], MD as Consulting Physician (Oncology) [name], RN as Nurse Navigator (Oncology)  Date of Service:  05/25/2021  CHIEF COMPLAINT: f/u of metastatic colon cancer  CURRENT THERAPY:  First-line FOLFOX and bevacizumab, starting 03/16/21  ASSESSMENT & PLAN:  Kari Patel is a 70 y.o. female with   1. 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 Work up with EGD and colonoscopy on 12/07/20 revealed a large cecal mass. Biopsy confirmed adenocarcinoma.  -She underwent repeat CT CAP on 03/10/21 showing: 7.1 cm cecal mass; similar extensive peritoneal/omental nodularity; similar prominent and mildly enlarged retroperitoneal and mesenteric lymph nodes; numerous scattered bilateral pulmonary nodules; similar mild right-sided hydronephrosis. -She proceeded to biopsy of an omental nodule on 03/13/21 confirming metastatic adenocarcinoma, consistent with colon primary.  -she began first line FOLFOX 03/16/21, and bevacizumab added with C2 (03/29/21), she has been tolerating well except constipation. -FO showed: MS-stable, low mutation burden 4 Muts/Mb. KRAS (+) -she is doing well with chemo except constipation and diarrhea issues. labs reviewed, overall adequate to proceed with C6 today. No need for Udenyca this cycle. I will reduce her dose due to continued GI issues. -plan for restaging scan after this cycle.   2. Symptom management: Constipation/diarrhea, weight loss -secondary to FOLFOX and her underline colon cancer  -she has lost weight because of the constipation. (10 lbs since starting treatment) -following C5, she developed diarrhea on day 3, which switched to constipation, then her symptoms subsided around day 10. She notes she uses colace. I recommended  she avoid dairy products and increase vegetable intake when she has diarrhea. I previously prescribed carafate in pill form. She notes the price was high, and she would like to try the liquid. I prescribed today. -She met with our nutritionist on 04/14/21. -She has started supplementing with Ensure. -started her on KCL on 04/13/21.   3. Anemia, iron deficiency  -likely related to her colon cancer and iron deficiency -She has received IV iron Venofer 200 mg, most recently 04/03/21. Hgb improving.   4. Goal of care discussion  -The patient understands the goal of care is palliative. -she is full code now      PLAN:  -proceed with C6 FOLFOX and Beva with with GCSF on day 3  -I prescribed (liquid) carafate today -labs, f/u, and C7 in 2 weeks, with restaging CT to be done several days before   No problem-specific Assessment & Plan notes found for this encounter.   SUMMARY OF ONCOLOGIC HISTORY: Oncology History Overview Note  Cancer Staging Cancer of right colon [facility]) Staging form: Colon and Rectum - Neuroendocine Tumors, AJCC 8th Edition - Clinical stage from 12/13/2020: Stage IV (cTX, cN1, cM1) - Signed by [name], MD on 03/01/2021    Cancer of right colon [facility])  12/07/2020 Procedure   Colonoscopy  Impression: - non-bleeding internal hemorrhoids - diverticulosis in entire examined colon - one 9 mm polyp in ascending colon removed with hot snare - one 13 mm polyp in transverse colon removed with hot fnare - likely malignant partially-obstructing tumor in cecum.   12/07/2020 Pathology Results   FINAL PATHOLOGIC DIAGNOSIS  SMALL BOWEL, BIOPSIES - duodenal mucosa within normal limits GASTRIC BODY AND ATRIUM, BIOPSY - gastric antral and body-type mucosa with moderate chronic Helicobacter  pylori gastritis ASCENDING COLON, POLYPECTOMY - tubular adenoma with prominent eosinophilic infiltrate CECUM, "MASS," BIOPSIES - moderately differentiated adenocarcinoma TRANSVERSE COLON,  POLYPECTOMY - high grade dysplasia involving tubulovillous adenoma - cauterized margins negative   12/13/2020 Cancer Staging   Staging form: Colon and Rectum - Neuroendocine Tumors, AJCC 8th Edition - Clinical stage from 12/13/2020: Stage IV (cTX, cN1, cM1) - Signed by [name], MD on 03/01/2021   12/22/2020 Imaging   CT CAP  IMPRESSION: Large 7.5 cm cecal mass with surrounding nodularity/lymphadenopathy 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 The largest measures 6 mm in the RUL. Findings are nonspecific but can be followed per Fleischner criteria Hiatal hernia   03/01/2021 Initial Diagnosis   Cancer of right colon ([place])   03/10/2021 Imaging   CT CAP  IMPRESSION: 1. Large infiltrative cecal mass measuring approximately 7.1 cm, reflecting the known right-sided colonic neoplasm. 2. Similar extensive peritoneal/omental nodularity throughout the abdomen and pelvis with trace free fluid in the pelvis along the pericolic gutters, consistent with disease involvement. 3. Similar prominent and mildly enlarged retroperitoneal and mesenteric lymph nodes, most likely reflecting disease involvement. 4. Numerous scattered bilateral pulmonary nodules measuring up to 6 mm are similar to prior and nonspecific but suspicious for disease involvement. Attention on follow-up imaging suggested. 5. Similar mild right-sided hydronephrosis without hydroureter, with the distal ureter traversing peritoneal nodularity. Likely reflecting partial UPJ obstruction given lack of hydroureter. Attention on follow-up imaging suggested. Cholelithiasis without findings of acute cholecystitis. 6.  Aortic Atherosclerosis  (ICD10-I70.0).   03/13/2021 Pathology Results   FINAL MICROSCOPIC DIAGNOSIS:   A. OMENTUM, NEEDLE CORE BIOPSY:  - Metastatic adenocarcinoma, consistent with clinical impression of a  colonic primary.  See comment    03/13/2021 Miscellaneous      03/16/2021 -  Chemotherapy   Patient is on Treatment Plan : COLORECTAL FOLFOX + Bevacizumab q14d        INTERVAL HISTORY:  Kari Patel is here for a follow up of metastatic colon cancer. She was last seen by me on 05/11/21. She presents to the clinic alone. She reports she goes from diarrhea following chemo (2 days after) to constipation. She notes she couldn't eat because she developed cramping. She reports her symptoms subsided about 10 days after; the last few days have been good.   All other systems were reviewed with the patient and are negative.  MEDICAL HISTORY:  Past Medical History:  Diagnosis Date   Anemia    Colon cancer ([place])    cecum   Heart murmur    Hypertension     SURGICAL HISTORY: Past Surgical History:  Procedure Laterality Date   ABDOMINAL HYSTERECTOMY     IR IMAGING GUIDED PORT INSERTION  03/13/2021    I have reviewed the social history and family history with the patient and they are unchanged from previous note.  ALLERGIES:  is allergic to demerol [meperidine hcl].  MEDICATIONS:  Current Outpatient Medications  Medication Sig Dispense Refill   lidocaine-prilocaine (EMLA) cream Apply 1 application topically as needed. 30 g 0   ondansetron (ZOFRAN) 8 MG tablet Take 1 tablet (8 mg total) by mouth every 8 (eight) hours as needed for nausea or vomiting. 20 tablet 1   prochlorperazine (COMPAZINE) 10 MG tablet Take 1 tablet (10 mg total) by mouth every 6 (six) hours  as needed for nausea or vomiting. 30 tablet 1   sucralfate (CARAFATE) 1 GM/10ML suspension Take 10 mLs (1 g total) by mouth 4 (four) times daily -  with meals and at bedtime. 420 mL 0   amLODipine (NORVASC) 10 MG tablet Take 10 mg by mouth daily.      lisinopril (ZESTRIL) 5 MG tablet Take 5 mg by mouth daily.     pantoprazole (PROTONIX) 40 MG tablet Take 1 tablet (40 mg total) by mouth daily. 30 tablet 0   potassium chloride SA (KLOR-CON) 20 MEQ tablet Take 1 tablet (20 mEq total) by mouth 2 (two) times daily. 20 tablet 0   No current facility-administered medications for this visit.    PHYSICAL EXAMINATION: ECOG PERFORMANCE STATUS: 1 - Symptomatic but completely ambulatory  Vitals:   05/25/21 1033  BP: (!) 161/86  Pulse: 85  Resp: 18  Temp: 98 F (36.7 C)  SpO2: 99%   Wt Readings from Last 3 Encounters:  05/25/21 134 lb 9.6 oz (61.1 kg)  05/11/21 149 lb 3.2 oz (67.7 kg)  04/26/21 146 lb 1.6 oz (66.3 kg)     GENERAL:alert, no distress and comfortable SKIN: skin color normal, no rashes or significant lesions EYES: normal, Conjunctiva are pink and non-injected, sclera clear  NEURO: alert & oriented x 3 with fluent speech  LABORATORY DATA:  I have reviewed the data as listed CBC Latest Ref Rng & Units 05/25/2021 05/11/2021 04/26/2021  WBC 4.0 - 10.5 K/uL 2.2(L) 4.5 7.1  Hemoglobin 12.0 - 15.0 g/dL 9.5(L) 9.3(L) 9.9(L)  Hematocrit 36.0 - 46.0 % 30.3(L) 29.5(L) 31.7(L)  Platelets 150 - 400 K/uL 231 378 251     CMP Latest Ref Rng & Units 05/11/2021 04/26/2021 04/13/2021  Glucose 70 - 99 mg/dL 101(H) 103(H) 128(H)  BUN 8 - 23 mg/dL 4(L) 5(L) 7(L)  Creatinine 0.44 - 1.00 mg/dL 0.58 0.57 0.64  Sodium 135 - 145 mmol/L 141 138 139  Potassium 3.5 - 5.1 mmol/L 3.2(L) 3.4(L) 3.3(L)  Chloride 98 - 111 mmol/L 104 102 103  CO2 22 - 32 mmol/L $RemoveB'25 23 23  'hPeOFDto$ Calcium 8.9 - 10.3 mg/dL 9.4 9.6 9.8  Total Protein 6.5 - 8.1 g/dL 7.6 7.8 8.2(H)  Total Bilirubin 0.3 - 1.2 mg/dL 0.3 0.3 0.4  Alkaline Phos 38 - 126 U/L 89 119 88  AST 15 - 41 U/L 9(L) 9(L) 9(L)  ALT 0 - 44 U/L '5 7 6      '$ RADIOGRAPHIC STUDIES: I have personally reviewed the radiological images as listed and agreed with the findings in the report. No results found.    No  orders of the defined types were placed in this encounter.  All questions were answered. The patient knows to call the clinic with any problems, questions or concerns. No barriers to learning was detected. The total time spent in the appointment was 30 minutes.     [name], MD 05/25/2021   I, Wilburn Mylar, am acting as scribe for [name], MD.   I have reviewed the above documentation for accuracy and completeness, and I agree with the above.

## 2021-05-25 NOTE — Patient Instructions (Signed)
[facility]  Discharge Instructions: Thank you for choosing [facility] to provide your oncology and hematology care.   If you have a lab appointment with the [facility], please go directly to the [facility] and check in at the registration area.   Wear comfortable clothing and clothing appropriate for easy access to any Portacath or PICC line.   We strive to give you quality time with your provider. You may need to reschedule your appointment if you arrive late (15 or more minutes).  Arriving late affects you and other patients whose appointments are after yours.  Also, if you miss three or more appointments without notifying the office, you may be dismissed from the clinic at the provider's discretion.      For prescription refill requests, have your pharmacy contact our office and allow 72 hours for refills to be completed.    Today you received the following chemotherapy and/or immunotherapy agents Bevacizumab-bvzr and FOLFOX      To help prevent nausea and vomiting after your treatment, we encourage you to take your nausea medication as directed.  BELOW ARE SYMPTOMS THAT SHOULD BE REPORTED IMMEDIATELY: *FEVER GREATER THAN 100.4 F (38 C) OR HIGHER *CHILLS OR SWEATING *NAUSEA AND VOMITING THAT IS NOT CONTROLLED WITH YOUR NAUSEA MEDICATION *UNUSUAL SHORTNESS OF BREATH *UNUSUAL BRUISING OR BLEEDING *URINARY PROBLEMS (pain or burning when urinating, or frequent urination) *BOWEL PROBLEMS (unusual diarrhea, constipation, pain near the anus) TENDERNESS IN MOUTH AND THROAT WITH OR WITHOUT PRESENCE OF ULCERS (sore throat, sores in mouth, or a toothache) UNUSUAL RASH, SWELLING OR PAIN  UNUSUAL VAGINAL DISCHARGE OR ITCHING   Items with * indicate a potential emergency and should be followed up as soon as possible or go to the Emergency Department if any problems should occur.  Please show the CHEMOTHERAPY ALERT CARD or IMMUNOTHERAPY ALERT  CARD at check-in to the Emergency Department and triage nurse.  Should you have questions after your visit or need to cancel or reschedule your appointment, please contact [facility]  Dept: [phone]  and follow the prompts.  Office hours are 8:00 a.m. to 4:30 p.m. Monday - Friday. Please note that voicemails left after 4:00 p.m. may not be returned until the following business day.  We are closed weekends and major holidays. You have access to a nurse at all times for urgent questions. Please call the main number to the clinic Dept: [phone] and follow the prompts.   For any non-urgent questions, you may also contact your provider using MyChart. We now offer e-Visits for anyone 41 and older to request care online for non-urgent symptoms. For details visit [email or web address].   Also download the MyChart app! Go to the app store, search "MyChart", open the app, select [facility], and log in with your MyChart username and password.  Due to Covid, a mask is required upon entering the hospital/clinic. If you do not have a mask, one will be given to you upon arrival. For doctor visits, patients may have 1 support person aged 18 or older with them. For treatment visits, patients cannot have anyone with them due to current Covid guidelines and our immunocompromised population.

## 2021-05-25 NOTE — Progress Notes (Signed)
Per [name], "OK to speed-up pt's 5FU to run over 44hrs d/t late connection time today."

## 2021-05-25 NOTE — Addendum Note (Signed)
Addended by: [name] on: 05/25/2021 02:26 PM   Modules accepted: Orders

## 2021-05-26 ENCOUNTER — Telehealth: Payer: Self-pay | Admitting: *Deleted

## 2021-05-26 ENCOUNTER — Telehealth: Payer: Self-pay | Admitting: Hematology

## 2021-05-26 NOTE — Telephone Encounter (Signed)
Scheduled follow-up appointments per 10/13 los. Patient is aware. 

## 2021-05-26 NOTE — Telephone Encounter (Signed)
Per [name] - scheduled for CT via [facility] w/Radiology [facility] Scheduling. Monday 06/05/21 at 10 am. Arrive [facility] at 0945. NPO after 6 am. Drink first bottle of contrast 8 am and 2nd bottle at 9 am. Contacted patient with dates/time/instructions. She verbalized understanding and will pick up contrast from [facility].

## 2021-05-27 ENCOUNTER — Inpatient Hospital Stay: Payer: Medicare HMO

## 2021-05-27 ENCOUNTER — Other Ambulatory Visit: Payer: Self-pay

## 2021-05-27 VITALS — BP 146/75 | HR 78 | Temp 97.7°F | Resp 18

## 2021-05-27 DIAGNOSIS — C182 Malignant neoplasm of ascending colon: Secondary | ICD-10-CM

## 2021-05-27 DIAGNOSIS — Z5112 Encounter for antineoplastic immunotherapy: Secondary | ICD-10-CM | POA: Diagnosis not present

## 2021-05-27 MED ORDER — PEGFILGRASTIM-CBQV 6 MG/0.6ML ~~LOC~~ SOSY
6.0000 mg | PREFILLED_SYRINGE | Freq: Once | SUBCUTANEOUS | Status: AC
  Administered 2021-05-27: 6 mg via SUBCUTANEOUS

## 2021-05-27 MED ORDER — SODIUM CHLORIDE 0.9% FLUSH
10.0000 mL | INTRAVENOUS | Status: DC | PRN
  Administered 2021-05-27: 10 mL

## 2021-05-27 MED ORDER — HEPARIN SOD (PORK) LOCK FLUSH 100 UNIT/ML IV SOLN
500.0000 [IU] | Freq: Once | INTRAVENOUS | Status: AC | PRN
  Administered 2021-05-27: 500 [IU]

## 2021-05-27 NOTE — Patient Instructions (Signed)
Pegfilgrastim injection What is this medication? PEGFILGRASTIM (PEG fil gra stim) is a long-acting granulocyte colony-stimulating factor that stimulates the growth of neutrophils, a type of white blood cell important in the body's fight against infection. It is used to reduce the incidence of fever and infection in patients with certain types of cancer who are receiving chemotherapy that affects the bone marrow, and to increase survival after being exposed to high doses of radiation. This medicine may be used for other purposes; ask your health care provider or pharmacist if you have questions. COMMON BRAND NAME(S): Rexene Edison, Ziextenzo What should I tell my care team before I take this medication? They need to know if you have any of these conditions: kidney disease latex allergy ongoing radiation therapy sickle cell disease skin reactions to acrylic adhesives (On-Body Injector only) an unusual or allergic reaction to pegfilgrastim, filgrastim, other medicines, foods, dyes, or preservatives pregnant or trying to get pregnant breast-feeding How should I use this medication? This medicine is for injection under the skin. If you get this medicine at home, you will be taught how to prepare and give the pre-filled syringe or how to use the On-body Injector. Refer to the patient Instructions for Use for detailed instructions. Use exactly as directed. Tell your healthcare provider immediately if you suspect that the On-body Injector may not have performed as intended or if you suspect the use of the On-body Injector resulted in a missed or partial dose. It is important that you put your used needles and syringes in a special sharps container. Do not put them in a trash can. If you do not have a sharps container, call your pharmacist or healthcare provider to get one. Talk to your pediatrician regarding the use of this medicine in children. While this drug may be prescribed for  selected conditions, precautions do apply. Overdosage: If you think you have taken too much of this medicine contact a poison control center or emergency room at once. NOTE: This medicine is only for you. Do not share this medicine with others. What if I miss a dose? It is important not to miss your dose. Call your doctor or health care professional if you miss your dose. If you miss a dose due to an On-body Injector failure or leakage, a new dose should be administered as soon as possible using a single prefilled syringe for manual use. What may interact with this medication? Interactions have not been studied. This list may not describe all possible interactions. Give your health care provider a list of all the medicines, herbs, non-prescription drugs, or dietary supplements you use. Also tell them if you smoke, drink alcohol, or use illegal drugs. Some items may interact with your medicine. What should I watch for while using this medication? Your condition will be monitored carefully while you are receiving this medicine. You may need blood work done while you are taking this medicine. Talk to your health care provider about your risk of cancer. You may be more at risk for certain types of cancer if you take this medicine. If you are going to need a MRI, CT scan, or other procedure, tell your doctor that you are using this medicine (On-Body Injector only). What side effects may I notice from receiving this medication? Side effects that you should report to your doctor or health care professional as soon as possible: allergic reactions (skin rash, itching or hives, swelling of the face, lips, or tongue) back pain dizziness fever pain,  redness, or irritation at site where injected pinpoint red spots on the skin red or dark-brown urine shortness of breath or breathing problems stomach or side pain, or pain at the shoulder swelling tiredness trouble passing urine or change in the amount of  urine unusual bruising or bleeding Side effects that usually do not require medical attention (report to your doctor or health care professional if they continue or are bothersome): bone pain muscle pain This list may not describe all possible side effects. Call your doctor for medical advice about side effects. You may report side effects to FDA at 1-800-FDA-1088. Where should I keep my medication? Keep out of the reach of children. If you are using this medicine at home, you will be instructed on how to store it. Throw away any unused medicine after the expiration date on the label. NOTE: This sheet is a summary. It may not cover all possible information. If you have questions about this medicine, talk to your doctor, pharmacist, or health care provider.  2022 Elsevier/Gold Standard (2020-08-26 11:54:14) Implanted [facility] Guide An implanted port is a device that is placed under the skin. It is usually placed in the chest. The device can be used to give IV medicine, to take blood, or for dialysis. You may have an implanted port if: You need IV medicine that would be irritating to the small veins in your hands or arms. You need IV medicines, such as antibiotics, for a long period of time. You need IV nutrition for a long period of time. You need dialysis. When you have a port, your health care provider can choose to use the port instead of veins in your arms for these procedures. You may have fewer limitations when using a port than you would if you used other types of long-term IVs, and you will likely be able to return to normal activities after your incision heals. An implanted port has two main parts: Reservoir. The reservoir is the part where a needle is inserted to give medicines or draw blood. The reservoir is round. After it is placed, it appears as a small, raised area under your skin. Catheter. The catheter is a thin, flexible tube that connects the reservoir to a vein. Medicine that  is inserted into the reservoir goes into the catheter and then into the vein. How is my port accessed? To access your port: A numbing cream may be placed on the skin over the port site. Your health care provider will put on a mask and sterile gloves. The skin over your port will be cleaned carefully with a germ-killing soap and allowed to dry. Your health care provider will gently pinch the port and insert a needle into it. Your health care provider will check for a blood return to make sure the port is in the vein and is not clogged. If your port needs to remain accessed to get medicine continuously (constant infusion), your health care provider will place a clear bandage (dressing) over the needle site. The dressing and needle will need to be changed every week, or as told by your health care provider. What is flushing? Flushing helps keep the port from getting clogged. Follow instructions from your health care provider about how and when to flush the port. Ports are usually flushed with saline solution or a medicine called heparin. The need for flushing will depend on how the port is used: If the port is only used from time to time to give medicines or  draw blood, the port may need to be flushed: Before and after medicines have been given. Before and after blood has been drawn. As part of routine maintenance. Flushing may be recommended every 4-6 weeks. If a constant infusion is running, the port may not need to be flushed. Throw away any syringes in a disposal container that is meant for sharp items (sharps container). You can buy a sharps container from a pharmacy, or you can make one by using an empty hard plastic bottle with a cover. How long will my port stay implanted? The port can stay in for as long as your health care provider thinks it is needed. When it is time for the port to come out, a surgery will be done to remove it. The surgery will be similar to the procedure that was done to  put the port in. Follow these instructions at home:  Flush your port as told by your health care provider. If you need an infusion over several days, follow instructions from your health care provider about how to take care of your port site. Make sure you: Wash your hands with soap and water before you change your dressing. If soap and water are not available, use alcohol-based hand sanitizer. Change your dressing as told by your health care provider. Place any used dressings or infusion bags into a plastic bag. Throw that bag in the trash. Keep the dressing that covers the needle clean and dry. Do not get it wet. Do not use scissors or sharp objects near the tube. Keep the tube clamped, unless it is being used. Check your port site every day for signs of infection. Check for: Redness, swelling, or pain. Fluid or blood. Pus or a bad smell. Protect the skin around the port site. Avoid wearing bra straps that rub or irritate the site. Protect the skin around your port from seat belts. Place a soft pad over your chest if needed. Bathe or shower as told by your health care provider. The site may get wet as long as you are not actively receiving an infusion. Return to your normal activities as told by your health care provider. Ask your health care provider what activities are safe for you. Carry a medical alert card or wear a medical alert bracelet at all times. This will let health care providers know that you have an implanted port in case of an emergency. Get help right away if: You have redness, swelling, or pain at the port site. You have fluid or blood coming from your port site. You have pus or a bad smell coming from the port site. You have a fever. Summary Implanted ports are usually placed in the chest for long-term IV access. Follow instructions from your health care provider about flushing the port and changing bandages (dressings). Take care of the area around your port by  avoiding clothing that puts pressure on the area, and by watching for signs of infection. Protect the skin around your port from seat belts. Place a soft pad over your chest if needed. Get help right away if you have a fever or you have redness, swelling, pain, drainage, or a bad smell at the port site. This information is not intended to replace advice given to you by your health care provider. Make sure you discuss any questions you have with your health care provider. Document Revised: 10/19/2020 Document Reviewed: 12/14/2019 Elsevier Patient Education  [place].

## 2021-06-02 ENCOUNTER — Other Ambulatory Visit (HOSPITAL_COMMUNITY): Payer: Medicare HMO

## 2021-06-02 ENCOUNTER — Other Ambulatory Visit: Payer: Self-pay | Admitting: Hematology

## 2021-06-05 ENCOUNTER — Ambulatory Visit (HOSPITAL_COMMUNITY)
Admission: RE | Admit: 2021-06-05 | Discharge: 2021-06-05 | Disposition: A | Discharge status: Home / Self Care | Payer: Medicare HMO | Source: Ambulatory Visit | Attending: Hematology | Admitting: Hematology

## 2021-06-05 ENCOUNTER — Other Ambulatory Visit: Payer: Self-pay

## 2021-06-05 DIAGNOSIS — C182 Malignant neoplasm of ascending colon: Secondary | ICD-10-CM | POA: Diagnosis present

## 2021-06-05 MED ORDER — IOHEXOL 350 MG/ML SOLN
80.0000 mL | Freq: Once | INTRAVENOUS | Status: AC | PRN
  Administered 2021-06-05: 80 mL via INTRAVENOUS

## 2021-06-06 ENCOUNTER — Other Ambulatory Visit: Payer: Self-pay | Admitting: Diagnostic Radiology

## 2021-06-06 MED FILL — Dexamethasone Sodium Phosphate Inj 100 MG/10ML: INTRAMUSCULAR | Qty: 1 | Status: AC

## 2021-06-07 ENCOUNTER — Encounter: Payer: Self-pay | Admitting: Hematology

## 2021-06-07 ENCOUNTER — Inpatient Hospital Stay: Payer: Medicare HMO

## 2021-06-07 ENCOUNTER — Inpatient Hospital Stay (HOSPITAL_BASED_OUTPATIENT_CLINIC_OR_DEPARTMENT_OTHER): Payer: Medicare HMO | Admitting: Hematology

## 2021-06-07 ENCOUNTER — Other Ambulatory Visit: Payer: Self-pay

## 2021-06-07 VITALS — BP 152/72 | HR 85 | Temp 98.1°F | Resp 18 | Ht 63.0 in | Wt 129.8 lb

## 2021-06-07 VITALS — BP 159/79 | HR 97 | Resp 18

## 2021-06-07 DIAGNOSIS — Z95828 Presence of other vascular implants and grafts: Secondary | ICD-10-CM

## 2021-06-07 DIAGNOSIS — C182 Malignant neoplasm of ascending colon: Secondary | ICD-10-CM

## 2021-06-07 DIAGNOSIS — D500 Iron deficiency anemia secondary to blood loss (chronic): Secondary | ICD-10-CM

## 2021-06-07 DIAGNOSIS — Z5112 Encounter for antineoplastic immunotherapy: Secondary | ICD-10-CM | POA: Diagnosis not present

## 2021-06-07 LAB — CBC WITH DIFFERENTIAL (CANCER CENTER ONLY)
Abs Immature Granulocytes: 0.26 10*3/uL — ABNORMAL HIGH (ref 0.00–0.07)
Basophils Absolute: 0.1 10*3/uL (ref 0.0–0.1)
Basophils Relative: 1 %
Eosinophils Absolute: 0.1 10*3/uL (ref 0.0–0.5)
Eosinophils Relative: 2 %
HCT: 31.1 % — ABNORMAL LOW (ref 36.0–46.0)
Hemoglobin: 9.9 g/dL — ABNORMAL LOW (ref 12.0–15.0)
Immature Granulocytes: 5 %
Lymphocytes Relative: 22 %
Lymphs Abs: 1.1 10*3/uL (ref 0.7–4.0)
MCH: 27.4 pg (ref 26.0–34.0)
MCHC: 31.8 g/dL (ref 30.0–36.0)
MCV: 86.1 fL (ref 80.0–100.0)
Monocytes Absolute: 0.5 10*3/uL (ref 0.1–1.0)
Monocytes Relative: 9 %
Neutro Abs: 3.1 10*3/uL (ref 1.7–7.7)
Neutrophils Relative %: 61 %
Platelet Count: 131 10*3/uL — ABNORMAL LOW (ref 150–400)
RBC: 3.61 MIL/uL — ABNORMAL LOW (ref 3.87–5.11)
RDW: 22.4 % — ABNORMAL HIGH (ref 11.5–15.5)
WBC Count: 5.1 10*3/uL (ref 4.0–10.5)
nRBC: 0 % (ref 0.0–0.2)

## 2021-06-07 LAB — CMP (CANCER CENTER ONLY)
ALT: 6 U/L (ref 0–44)
AST: 12 U/L — ABNORMAL LOW (ref 15–41)
Albumin: 3.1 g/dL — ABNORMAL LOW (ref 3.5–5.0)
Alkaline Phosphatase: 106 U/L (ref 38–126)
Anion gap: 13 (ref 5–15)
BUN: 5 mg/dL — ABNORMAL LOW (ref 8–23)
CO2: 24 mmol/L (ref 22–32)
Calcium: 9.1 mg/dL (ref 8.9–10.3)
Chloride: 103 mmol/L (ref 98–111)
Creatinine: 0.58 mg/dL (ref 0.44–1.00)
GFR, Estimated: >60 mL/min (ref >60)
Glucose, Bld: 103 mg/dL — ABNORMAL HIGH (ref 70–99)
Potassium: 3.1 mmol/L — ABNORMAL LOW (ref 3.5–5.1)
Sodium: 140 mmol/L (ref 135–145)
Total Bilirubin: 0.3 mg/dL (ref 0.3–1.2)
Total Protein: 7.3 g/dL (ref 6.5–8.1)

## 2021-06-07 LAB — TOTAL PROTEIN, URINE DIPSTICK: Protein, ur: 100 mg/dL — AB

## 2021-06-07 LAB — CEA (IN HOUSE-CHCC): CEA (CHCC-In House): 7.68 ng/mL — ABNORMAL HIGH (ref 0.00–5.00)

## 2021-06-07 MED ORDER — PALONOSETRON HCL INJECTION 0.25 MG/5ML
0.2500 mg | Freq: Once | INTRAVENOUS | Status: AC
  Administered 2021-06-07: 0.25 mg via INTRAVENOUS
  Filled 2021-06-07: qty 5

## 2021-06-07 MED ORDER — SODIUM CHLORIDE 0.9% FLUSH: 10.0000 mL | INTRAVENOUS | Status: DC | PRN

## 2021-06-07 MED ORDER — SODIUM CHLORIDE 0.9 % IV SOLN
5.0000 mg/kg | Freq: Once | INTRAVENOUS | Status: AC
  Administered 2021-06-07: 300 mg via INTRAVENOUS
  Filled 2021-06-07: qty 12

## 2021-06-07 MED ORDER — SODIUM CHLORIDE 0.9% FLUSH
10.0000 mL | Freq: Once | INTRAVENOUS | Status: AC
  Administered 2021-06-07: 10 mL

## 2021-06-07 MED ORDER — DEXTROSE 5 % IV SOLN: Freq: Once | INTRAVENOUS | Status: AC

## 2021-06-07 MED ORDER — SODIUM CHLORIDE 0.9 % IV SOLN: Freq: Once | INTRAVENOUS | Status: AC

## 2021-06-07 MED ORDER — SODIUM CHLORIDE 0.9 % IV SOLN
2400.0000 mg/m2 | INTRAVENOUS | Status: DC
  Administered 2021-06-07: 4200 mg via INTRAVENOUS
  Filled 2021-06-07: qty 84

## 2021-06-07 MED ORDER — LEUCOVORIN CALCIUM INJECTION 350 MG
400.0000 mg/m2 | Freq: Once | INTRAVENOUS | Status: AC
  Administered 2021-06-07: 696 mg via INTRAVENOUS
  Filled 2021-06-07: qty 34.8

## 2021-06-07 MED ORDER — SODIUM CHLORIDE 0.9 % IV SOLN
10.0000 mg | Freq: Once | INTRAVENOUS | Status: AC
  Administered 2021-06-07: 10 mg via INTRAVENOUS
  Filled 2021-06-07: qty 10

## 2021-06-07 MED ORDER — HEPARIN SOD (PORK) LOCK FLUSH 100 UNIT/ML IV SOLN: 500.0000 [IU] | Freq: Once | INTRAVENOUS | Status: DC | PRN

## 2021-06-07 MED ORDER — OXALIPLATIN CHEMO INJECTION 100 MG/20ML
85.0000 mg/m2 | Freq: Once | INTRAVENOUS | Status: AC
  Administered 2021-06-07: 150 mg via INTRAVENOUS
  Filled 2021-06-07: qty 20

## 2021-06-07 NOTE — Progress Notes (Signed)
Patient completed her treatment, stood up to leave and fell back into her chair. She stated that she could not feel her arms and legs. She then stated that she could not breathe. Wrapped the patient in warm blankets and covered her airway with a warm towel and placed her on 2L o2. This RN remained with the patient to help her calm down and VSS were obtained. As the patient warmed up she began to feel better. First Verline Lema PA from [facility] came to assess the patient and then [name] assessed the patient. The patient understood that it was the cold sensitivity from the Oxaliplatin that caused all her symptoms and the key to resolution is heat. Her som [name] was contacted and he came back and the above information was provided to [name] so he could support the patient at home. The patient questioned keeping the pump on but understood that her reaction was not related to the pump but instead the Oxaliplatin she already received. Patient will call with any questions or concerns. She was stable at time of DC and this RN assisted patient to the car with blankets and warm towel.

## 2021-06-07 NOTE — Progress Notes (Signed)
Kari Patel   [phone] Fax:([phone]   Clinic Follow up Note   Patient Care Team: Pcp, No as PCP - General [name], MD as Consulting Physician (Oncology) [name], RN as Nurse Navigator (Oncology)  Date of Service:  06/07/2021  CHIEF COMPLAINT: f/u of metastatic colon cancer  CURRENT THERAPY:  First-line FOLFOX and bevacizumab, starting 03/16/21  ASSESSMENT & PLAN:  Kari Patel is a 70 y.o. female with   1. 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 Work up with EGD and colonoscopy on 12/07/20 revealed a large cecal mass. Biopsy confirmed adenocarcinoma.  -She underwent repeat CT CAP on 03/10/21 showing: 7.1 cm cecal mass; similar extensive peritoneal/omental nodularity; similar prominent and mildly enlarged retroperitoneal and mesenteric lymph nodes; numerous scattered bilateral pulmonary nodules; similar mild right-sided hydronephrosis. -She proceeded to biopsy of an omental nodule on 03/13/21 confirming metastatic adenocarcinoma, consistent with colon primary.  -she began first line FOLFOX 03/16/21, and bevacizumab added with C2 (03/29/21), she has been tolerating well except constipation. -FO showed: MS-stable, low mutation burden 4 Muts/Mb. KRAS (+) -she is feeling better overall; her constipation and nausea has mostly resolved now. Labs reviewed, CBC overall stable and urine protein adequate to proceed with treatment.    2. Symptom management: Constipation/diarrhea, weight loss -secondary to FOLFOX and her underline colon cancer  -she has lost weight because of the constipation. (10 lbs since starting treatment) -following C5, she developed diarrhea on day 3, which switched to constipation, then her symptoms subsided around day 10. She notes she uses colace. I recommended she avoid dairy products and increase vegetable intake when she has diarrhea. I  previously prescribed carafate. -She met with our nutritionist on 04/14/21. -started her on KCL on 04/13/21. -for her weight loss, I recommend she continue Ensure or Boost and use it as a snack between meals. -she reports the nausea and constipation has overall resolved (06/07/21)   3. Anemia, iron deficiency  -likely related to her colon cancer and iron deficiency -She has received IV iron Venofer 200 mg, most recently 04/03/21. Hgb improving.   4. Goal of care discussion  -The patient understands the goal of care is palliative. -she is full code now      PLAN:  -CT scan reviewed, excellent response, will continue FOLFOX -proceed with C7 FOLFOX and Beva with with GCSF on day 3  -labs, f/u, and chemo as scheduled 06/22/21, plan to skip one cycle chemo around Thanksgiving   Addendum Pt had an episode of numbness in extremities and dyspnea when she stood up after completed oxaliplatin. I saw her in infusion room. She was hypertensive and tachycardia, but no skin redness, edema, or wheezing on exam.  Her symptoms gradually resolved after supportive care.  This is likely oxaliplatin toxicity, plan to slightly reduce dose on next cycle, and increase infusion to 3 hours.  [name]    No problem-specific Assessment & Plan notes found for this encounter.   SUMMARY OF ONCOLOGIC HISTORY: Oncology History Overview Note  Cancer Staging Cancer of right colon [facility]) Staging form: Colon and Rectum - Neuroendocine Tumors, AJCC 8th Edition - Clinical stage from 12/13/2020: Stage IV (cTX, cN1, cM1) - Signed by [name], MD on 03/01/2021    Cancer of right colon [facility])  12/07/2020 Procedure   Colonoscopy  Impression: - non-bleeding internal hemorrhoids - diverticulosis in entire examined colon - one 9 mm polyp  in ascending colon removed with hot snare - one 13 mm polyp in transverse colon removed with hot fnare - likely malignant partially-obstructing tumor in cecum.   12/07/2020 Pathology Results    FINAL PATHOLOGIC DIAGNOSIS  SMALL BOWEL, BIOPSIES - duodenal mucosa within normal limits GASTRIC BODY AND ATRIUM, BIOPSY - gastric antral and body-type mucosa with moderate chronic Helicobacter pylori gastritis ASCENDING COLON, POLYPECTOMY - tubular adenoma with prominent eosinophilic infiltrate CECUM, "MASS," BIOPSIES - moderately differentiated adenocarcinoma TRANSVERSE COLON, POLYPECTOMY - high grade dysplasia involving tubulovillous adenoma - cauterized margins negative   12/13/2020 Cancer Staging   Staging form: Colon and Rectum - Neuroendocine Tumors, AJCC 8th Edition - Clinical stage from 12/13/2020: Stage IV (cTX, cN1, cM1) - Signed by [name], MD on 03/01/2021    12/22/2020 Imaging   CT CAP  IMPRESSION: Large 7.5 cm cecal mass with surrounding nodularity/lymphadenopathy 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 The largest measures 6 mm in the RUL. Findings are nonspecific but can be followed per Fleischner criteria Hiatal hernia   03/01/2021 Initial Diagnosis   Cancer of right colon ([place])   03/10/2021 Imaging   CT CAP  IMPRESSION: 1. Large infiltrative cecal mass measuring approximately 7.1 cm, reflecting the known right-sided colonic neoplasm. 2. Similar extensive peritoneal/omental nodularity throughout the abdomen and pelvis with trace free fluid in the pelvis along the pericolic gutters, consistent with disease involvement. 3. Similar prominent and mildly enlarged retroperitoneal and mesenteric lymph nodes, most likely reflecting disease involvement. 4. Numerous scattered bilateral pulmonary nodules measuring up to 6 mm are similar to prior and nonspecific but suspicious for  disease involvement. Attention on follow-up imaging suggested. 5. Similar mild right-sided hydronephrosis without hydroureter, with the distal ureter traversing peritoneal nodularity. Likely reflecting partial UPJ obstruction given lack of hydroureter. Attention on follow-up imaging suggested. Cholelithiasis without findings of acute cholecystitis. 6.  Aortic Atherosclerosis (ICD10-I70.0).   03/13/2021 Pathology Results   FINAL MICROSCOPIC DIAGNOSIS:   A. OMENTUM, NEEDLE CORE BIOPSY:  - Metastatic adenocarcinoma, consistent with clinical impression of a  colonic primary.  See comment    03/13/2021 Miscellaneous      03/16/2021 -  Chemotherapy   Patient is on Treatment Plan : COLORECTAL FOLFOX + Bevacizumab q14d     06/05/2021 Imaging   CT CAP  IMPRESSION: Chest Impression:   1. Bilateral small pulmonary nodules appear unchanged. 2. No new nodularity. 3. No metastatic adenopathy.   Abdomen / Pelvis Impression:   1. Marked decrease in volume of circumferential mass surrounding the distal ileum leading up the terminal ileum. 2. Marked decrease in size of peritoneal implants along the ventral peritoneal surface of the lower abdomen. Persistent nodularity again much improved. 3. No evidence of liver metastasis.      INTERVAL HISTORY:  Kari Patel is here for a follow up of metastatic colon cancer. She was last seen by me on 05/25/21. She presents to the clinic alone. She reports her nausea and constipation has overall resolved. She notes she only had one instance after her last cycle. She reports she is feeling very well. She notes difficulty eating for the first week following treatment, but she is able to recover, and she ate well the last 5 or so days.   All other systems were reviewed with the patient and are negative.  MEDICAL HISTORY:  Past Medical History:  Diagnosis Date   Anemia    Colon cancer ([place])    cecum   Heart murmur    Hypertension     SURGICAL  HISTORY: Past Surgical History:  Procedure Laterality Date   ABDOMINAL HYSTERECTOMY     IR IMAGING GUIDED PORT INSERTION  03/13/2021    I have reviewed the social history and family history with the patient and they are unchanged from previous note.  ALLERGIES:  is allergic to demerol [meperidine hcl].  MEDICATIONS:  Current Outpatient Medications  Medication Sig Dispense Refill   lidocaine-prilocaine (EMLA) cream Apply 1 application topically as needed. 30 g 0   ondansetron (ZOFRAN) 8 MG tablet Take 1 tablet (8 mg total) by mouth every 8 (eight) hours as needed for nausea or vomiting. 20 tablet 1   prochlorperazine (COMPAZINE) 10 MG tablet Take 1 tablet (10 mg total) by mouth every 6 (six) hours as needed for nausea or vomiting. 30 tablet 1   amLODipine (NORVASC) 10 MG tablet Take 10 mg by mouth daily.     lisinopril (ZESTRIL) 5 MG tablet Take 5 mg by mouth daily.     pantoprazole (PROTONIX) 40 MG tablet TAKE 1 TABLET BY MOUTH EVERY DAY 30 tablet 0   potassium chloride SA (KLOR-CON) 20 MEQ tablet Take 1 tablet (20 mEq total) by mouth 2 (two) times daily. 20 tablet 0   sucralfate (CARAFATE) 1 GM/10ML suspension Take 10 mLs (1 g total) by mouth 4 (four) times daily -  with meals and at bedtime. 420 mL 0   No current facility-administered medications for this visit.    PHYSICAL EXAMINATION: ECOG PERFORMANCE STATUS: 1 - Symptomatic but completely ambulatory  Vitals:   06/07/21 0905  BP: (!) 152/72  Pulse: 85  Resp: 18  Temp: 98.1 F (36.7 C)  SpO2: 100%   Wt Readings from Last 3 Encounters:  06/07/21 58.9 kg  05/25/21 61.1 kg  05/11/21 67.7 kg     GENERAL:alert, no distress and comfortable SKIN: skin color normal, no rashes or significant lesions EYES: normal, Conjunctiva are pink and non-injected, sclera clear  NEURO: alert & oriented x 3 with fluent speech  LABORATORY DATA:  I have reviewed the data as listed CBC Latest Ref Rng & Units 06/07/2021 05/25/2021 05/11/2021   WBC 4.0 - 10.5 K/uL 5.1 2.2(L) 4.5  Hemoglobin 12.0 - 15.0 g/dL 9.9(L) 9.5(L) 9.3(L)  Hematocrit 36.0 - 46.0 % 31.1(L) 30.3(L) 29.5(L)  Platelets 150 - 400 K/uL 131(L) 231 378     CMP Latest Ref Rng & Units 06/07/2021 05/25/2021 05/11/2021  Glucose 70 - 99 mg/dL 103(H) 101(H) 101(H)  BUN 8 - 23 mg/dL 5(L) 5(L) 4(L)  Creatinine 0.44 - 1.00 mg/dL 0.58 0.41(L) 0.58  Sodium 135 - 145 mmol/L 140 138 141  Potassium 3.5 - 5.1 mmol/L 3.1(L) 3.4(L) 3.2(L)  Chloride 98 - 111 mmol/L 103 101 104  CO2 22 - 32 mmol/L _0 Calcium 8.9 - 10.3 mg/dL 9.1 9.1 9.4  Total Protein 6.5 - 8.1 g/dL 7.3 7.8 7.6  Total Bilirubin 0.3 - 1.2 mg/dL 0.3 0.3 0.3  Alkaline Phos 38 - 126 U/L 106 74 89  AST 15 - 41 U/L 12(L) 12(L) 9(L)  ALT 0 - 44 U/L 6 9 <5      RADIOGRAPHIC STUDIES: I have personally reviewed the radiological images as listed and agreed with the findings in the report. No results found.    No orders of the defined types were  placed in this encounter.  All questions were answered. The patient knows to call the clinic with any problems, questions or concerns. No barriers to learning was detected. The total time spent in the appointment was 40 minutes.     [name], MD 06/07/2021   I, Wilburn Mylar, am acting as scribe for [name], MD.   I have reviewed the above documentation for accuracy and completeness, and I agree with the above.

## 2021-06-07 NOTE — Progress Notes (Signed)
Patient seen in infusion clinic for possible reaction after completing FOLFOX.  After finishing treatment patient stood to walk to the restroom and felt hot and lightheaded.  She immediately sat back down in her chair.  There is no loss of consciousness.  Patient admitted to feeling like she could not breathe.  Vital signs were checked and patient was tachycardic to the 140s, hypertensive, without hypoxia.  No additional medications or interventions were needed.  Patient was anxious appearing. Patient observed for an additional time and returned to baseline.  Vital signs remained stable.  Patient also evaluated by [name] and it was felt this was reaction to oxaliplatin although is not categorized as an allergic reaction. Patient able to be discharged home in stable condition.

## 2021-06-07 NOTE — Patient Instructions (Signed)
[facility]  Discharge Instructions: Thank you for choosing [facility] to provide your oncology and hematology care.   If you have a lab appointment with the [facility], please go directly to the [facility] and check in at the registration area.   Wear comfortable clothing and clothing appropriate for easy access to any Portacath or PICC line.   We strive to give you quality time with your provider. You may need to reschedule your appointment if you arrive late (15 or more minutes).  Arriving late affects you and other patients whose appointments are after yours.  Also, if you miss three or more appointments without notifying the office, you may be dismissed from the clinic at the provider's discretion.      For prescription refill requests, have your pharmacy contact our office and allow 72 hours for refills to be completed.    Today you received the following chemotherapy and/or immunotherapy agents : Bevacizumab, oxaliplatin, leucovorin, 5FU      To help prevent nausea and vomiting after your treatment, we encourage you to take your nausea medication as directed.  BELOW ARE SYMPTOMS THAT SHOULD BE REPORTED IMMEDIATELY: *FEVER GREATER THAN 100.4 F (38 C) OR HIGHER *CHILLS OR SWEATING *NAUSEA AND VOMITING THAT IS NOT CONTROLLED WITH YOUR NAUSEA MEDICATION *UNUSUAL SHORTNESS OF BREATH *UNUSUAL BRUISING OR BLEEDING *URINARY PROBLEMS (pain or burning when urinating, or frequent urination) *BOWEL PROBLEMS (unusual diarrhea, constipation, pain near the anus) TENDERNESS IN MOUTH AND THROAT WITH OR WITHOUT PRESENCE OF ULCERS (sore throat, sores in mouth, or a toothache) UNUSUAL RASH, SWELLING OR PAIN  UNUSUAL VAGINAL DISCHARGE OR ITCHING   Items with * indicate a potential emergency and should be followed up as soon as possible or go to the Emergency Department if any problems should occur.  Please show the CHEMOTHERAPY ALERT CARD or  IMMUNOTHERAPY ALERT CARD at check-in to the Emergency Department and triage nurse.  Should you have questions after your visit or need to cancel or reschedule your appointment, please contact [facility]  Dept: [phone]  and follow the prompts.  Office hours are 8:00 a.m. to 4:30 p.m. Monday - Friday. Please note that voicemails left after 4:00 p.m. may not be returned until the following business day.  We are closed weekends and major holidays. You have access to a nurse at all times for urgent questions. Please call the main number to the clinic Dept: [phone] and follow the prompts.   For any non-urgent questions, you may also contact your provider using MyChart. We now offer e-Visits for anyone 59 and older to request care online for non-urgent symptoms. For details visit [email or web address].   Also download the MyChart app! Go to the app store, search "MyChart", open the app, select [facility], and log in with your MyChart username and password.  Due to Covid, a mask is required upon entering the hospital/clinic. If you do not have a mask, one will be given to you upon arrival. For doctor visits, patients may have 1 support person aged 17 or older with them. For treatment visits, patients cannot have anyone with them due to current Covid guidelines and our immunocompromised population.

## 2021-06-08 ENCOUNTER — Telehealth: Payer: Self-pay | Admitting: Hematology

## 2021-06-08 NOTE — Telephone Encounter (Signed)
Scheduled follow-up appointments per 10/26 los. Patient is aware. 

## 2021-06-09 ENCOUNTER — Other Ambulatory Visit: Payer: Self-pay

## 2021-06-09 ENCOUNTER — Inpatient Hospital Stay: Payer: Medicare HMO

## 2021-06-09 VITALS — BP 139/79 | HR 95 | Temp 99.5°F | Resp 16

## 2021-06-09 DIAGNOSIS — Z5112 Encounter for antineoplastic immunotherapy: Secondary | ICD-10-CM | POA: Diagnosis not present

## 2021-06-09 DIAGNOSIS — C182 Malignant neoplasm of ascending colon: Secondary | ICD-10-CM

## 2021-06-09 MED ORDER — SODIUM CHLORIDE 0.9% FLUSH
10.0000 mL | INTRAVENOUS | Status: DC | PRN
  Administered 2021-06-09: 10 mL

## 2021-06-09 MED ORDER — HEPARIN SOD (PORK) LOCK FLUSH 100 UNIT/ML IV SOLN
500.0000 [IU] | Freq: Once | INTRAVENOUS | Status: AC | PRN
  Administered 2021-06-09: 500 [IU]

## 2021-06-09 MED ORDER — PEGFILGRASTIM-CBQV 6 MG/0.6ML ~~LOC~~ SOSY
6.0000 mg | PREFILLED_SYRINGE | Freq: Once | SUBCUTANEOUS | Status: AC
  Administered 2021-06-09: 6 mg via SUBCUTANEOUS
  Filled 2021-06-09: qty 0.6

## 2021-06-21 ENCOUNTER — Other Ambulatory Visit: Payer: Self-pay | Admitting: Hematology

## 2021-06-21 ENCOUNTER — Other Ambulatory Visit: Payer: Self-pay

## 2021-06-21 ENCOUNTER — Telehealth: Payer: Self-pay

## 2021-06-21 DIAGNOSIS — K219 Gastro-esophageal reflux disease without esophagitis: Secondary | ICD-10-CM

## 2021-06-21 DIAGNOSIS — Z95828 Presence of other vascular implants and grafts: Secondary | ICD-10-CM

## 2021-06-21 MED ORDER — LIDOCAINE-PRILOCAINE 2.5-2.5 % EX CREA
1.0000 | TOPICAL_CREAM | CUTANEOUS | 4 refills | Status: DC | PRN
Start: 2021-06-21 — End: 2021-10-18

## 2021-06-21 NOTE — Telephone Encounter (Signed)
Pt called requesting refill on EMLA cream and needed the prescription to be sent [facility] to [facility] on [address].  Sent refill for EMLA cream to the [facility] [address].  Called pt back to let her know the refill has been submitted to [facility] [address].

## 2021-06-22 ENCOUNTER — Inpatient Hospital Stay (HOSPITAL_BASED_OUTPATIENT_CLINIC_OR_DEPARTMENT_OTHER): Payer: Medicare HMO | Admitting: Nurse Practitioner

## 2021-06-22 ENCOUNTER — Inpatient Hospital Stay: Payer: Medicare HMO | Attending: Hematology

## 2021-06-22 ENCOUNTER — Inpatient Hospital Stay: Payer: Medicare HMO

## 2021-06-22 ENCOUNTER — Other Ambulatory Visit: Payer: Self-pay

## 2021-06-22 ENCOUNTER — Encounter: Payer: Self-pay | Admitting: Nurse Practitioner

## 2021-06-22 VITALS — BP 145/77 | HR 92 | Temp 98.2°F | Resp 18 | Ht 63.0 in | Wt 132.3 lb

## 2021-06-22 DIAGNOSIS — Z5189 Encounter for other specified aftercare: Secondary | ICD-10-CM | POA: Diagnosis not present

## 2021-06-22 DIAGNOSIS — Z5112 Encounter for antineoplastic immunotherapy: Secondary | ICD-10-CM | POA: Insufficient documentation

## 2021-06-22 DIAGNOSIS — C182 Malignant neoplasm of ascending colon: Secondary | ICD-10-CM

## 2021-06-22 DIAGNOSIS — Z452 Encounter for adjustment and management of vascular access device: Secondary | ICD-10-CM | POA: Diagnosis not present

## 2021-06-22 DIAGNOSIS — D500 Iron deficiency anemia secondary to blood loss (chronic): Secondary | ICD-10-CM

## 2021-06-22 DIAGNOSIS — Z5111 Encounter for antineoplastic chemotherapy: Secondary | ICD-10-CM | POA: Diagnosis present

## 2021-06-22 DIAGNOSIS — C180 Malignant neoplasm of cecum: Secondary | ICD-10-CM | POA: Diagnosis present

## 2021-06-22 DIAGNOSIS — Z95828 Presence of other vascular implants and grafts: Secondary | ICD-10-CM

## 2021-06-22 DIAGNOSIS — D509 Iron deficiency anemia, unspecified: Secondary | ICD-10-CM | POA: Diagnosis not present

## 2021-06-22 LAB — CMP (CANCER CENTER ONLY)
ALT: 6 U/L (ref 0–44)
AST: 12 U/L — ABNORMAL LOW (ref 15–41)
Albumin: 3.1 g/dL — ABNORMAL LOW (ref 3.5–5.0)
Alkaline Phosphatase: 111 U/L (ref 38–126)
Anion gap: 12 (ref 5–15)
BUN: 6 mg/dL — ABNORMAL LOW (ref 8–23)
CO2: 24 mmol/L (ref 22–32)
Calcium: 8.9 mg/dL (ref 8.9–10.3)
Chloride: 104 mmol/L (ref 98–111)
Creatinine: 0.58 mg/dL (ref 0.44–1.00)
GFR, Estimated: >60 mL/min (ref >60)
Glucose, Bld: 98 mg/dL (ref 70–99)
Potassium: 3.4 mmol/L — ABNORMAL LOW (ref 3.5–5.1)
Sodium: 140 mmol/L (ref 135–145)
Total Bilirubin: 0.2 mg/dL — ABNORMAL LOW (ref 0.3–1.2)
Total Protein: 7.2 g/dL (ref 6.5–8.1)

## 2021-06-22 LAB — CBC WITH DIFFERENTIAL (CANCER CENTER ONLY)
Abs Immature Granulocytes: 0.22 10*3/uL — ABNORMAL HIGH (ref 0.00–0.07)
Basophils Absolute: 0.1 10*3/uL (ref 0.0–0.1)
Basophils Relative: 1 %
Eosinophils Absolute: 0 10*3/uL (ref 0.0–0.5)
Eosinophils Relative: 1 %
HCT: 28.9 % — ABNORMAL LOW (ref 36.0–46.0)
Hemoglobin: 9.3 g/dL — ABNORMAL LOW (ref 12.0–15.0)
Immature Granulocytes: 4 %
Lymphocytes Relative: 18 %
Lymphs Abs: 1.1 10*3/uL (ref 0.7–4.0)
MCH: 28.2 pg (ref 26.0–34.0)
MCHC: 32.2 g/dL (ref 30.0–36.0)
MCV: 87.6 fL (ref 80.0–100.0)
Monocytes Absolute: 0.6 10*3/uL (ref 0.1–1.0)
Monocytes Relative: 10 %
Neutro Abs: 4.1 10*3/uL (ref 1.7–7.7)
Neutrophils Relative %: 66 %
Platelet Count: 187 10*3/uL (ref 150–400)
RBC: 3.3 MIL/uL — ABNORMAL LOW (ref 3.87–5.11)
RDW: 19.9 % — ABNORMAL HIGH (ref 11.5–15.5)
WBC Count: 6 10*3/uL (ref 4.0–10.5)
nRBC: 0 % (ref 0.0–0.2)

## 2021-06-22 LAB — TOTAL PROTEIN, URINE DIPSTICK: Protein, ur: 30 mg/dL — AB

## 2021-06-22 MED ORDER — LORAZEPAM 0.5 MG PO TABS: 0.5000 mg | ORAL_TABLET | Freq: Three times a day (TID) | ORAL | 0 refills | Status: DC

## 2021-06-22 MED ORDER — PALONOSETRON HCL INJECTION 0.25 MG/5ML
0.2500 mg | Freq: Once | INTRAVENOUS | Status: AC
  Administered 2021-06-22: 0.25 mg via INTRAVENOUS
  Filled 2021-06-22: qty 5

## 2021-06-22 MED ORDER — OXALIPLATIN CHEMO INJECTION 100 MG/20ML
70.0000 mg/m2 | Freq: Once | INTRAVENOUS | Status: AC
  Administered 2021-06-22: 120 mg via INTRAVENOUS
  Filled 2021-06-22: qty 20

## 2021-06-22 MED ORDER — SODIUM CHLORIDE 0.9 % IV SOLN
10.0000 mg | Freq: Once | INTRAVENOUS | Status: AC
  Administered 2021-06-22: 10 mg via INTRAVENOUS
  Filled 2021-06-22: qty 10

## 2021-06-22 MED ORDER — SODIUM CHLORIDE 0.9 % IV SOLN
5.0000 mg/kg | Freq: Once | INTRAVENOUS | Status: AC
  Administered 2021-06-22: 300 mg via INTRAVENOUS
  Filled 2021-06-22: qty 12

## 2021-06-22 MED ORDER — SODIUM CHLORIDE 0.9% FLUSH
10.0000 mL | Freq: Once | INTRAVENOUS | Status: AC
  Administered 2021-06-22: 10 mL

## 2021-06-22 MED ORDER — LORAZEPAM 1 MG PO TABS: 0.5000 mg | ORAL_TABLET | Freq: Once | ORAL | Status: DC

## 2021-06-22 MED ORDER — LEUCOVORIN CALCIUM INJECTION 350 MG
400.0000 mg/m2 | Freq: Once | INTRAVENOUS | Status: AC
  Administered 2021-06-22: 696 mg via INTRAVENOUS
  Filled 2021-06-22: qty 34.8

## 2021-06-22 MED ORDER — FAMOTIDINE 20 MG IN NS 100 ML IVPB
20.0000 mg | Freq: Once | INTRAVENOUS | Status: AC
  Administered 2021-06-22: 20 mg via INTRAVENOUS
  Filled 2021-06-22: qty 100

## 2021-06-22 MED ORDER — HEPARIN SOD (PORK) LOCK FLUSH 100 UNIT/ML IV SOLN: 500.0000 [IU] | Freq: Once | INTRAVENOUS | Status: DC | PRN

## 2021-06-22 MED ORDER — SODIUM CHLORIDE 0.9% FLUSH: 10.0000 mL | INTRAVENOUS | Status: DC | PRN

## 2021-06-22 MED ORDER — SODIUM CHLORIDE 0.9 % IV SOLN
2400.0000 mg/m2 | INTRAVENOUS | Status: DC
  Administered 2021-06-22: 4200 mg via INTRAVENOUS
  Filled 2021-06-22: qty 84

## 2021-06-22 MED ORDER — SODIUM CHLORIDE 0.9 % IV SOLN: Freq: Once | INTRAVENOUS | Status: AC

## 2021-06-22 MED ORDER — DEXTROSE 5 % IV SOLN: Freq: Once | INTRAVENOUS | Status: AC

## 2021-06-22 MED ORDER — LORAZEPAM 1 MG PO TABS
0.5000 mg | ORAL_TABLET | Freq: Once | ORAL | Status: AC
  Administered 2021-06-22: 0.5 mg via ORAL
  Filled 2021-06-22: qty 1

## 2021-06-22 NOTE — Progress Notes (Signed)
Duplicate

## 2021-06-22 NOTE — Patient Instructions (Addendum)
[facility]  Discharge Instructions: Thank you for choosing [facility] to provide your oncology and hematology care.   If you have a lab appointment with the [facility], please go directly to the [facility] and check in at the registration area.   Wear comfortable clothing and clothing appropriate for easy access to any Portacath or PICC line.   We strive to give you quality time with your provider. You may need to reschedule your appointment if you arrive late (15 or more minutes).  Arriving late affects you and other patients whose appointments are after yours.  Also, if you miss three or more appointments without notifying the office, you may be dismissed from the clinic at the provider's discretion.      For prescription refill requests, have your pharmacy contact our office and allow 72 hours for refills to be completed.    Today you received the following chemotherapy and/or immunotherapy agents: bevacizumab/oxaliplatin/leucovorin/fluoruoracil      To help prevent nausea and vomiting after your treatment, we encourage you to take your nausea medication as directed.  BELOW ARE SYMPTOMS THAT SHOULD BE REPORTED IMMEDIATELY: *FEVER GREATER THAN 100.4 F (38 C) OR HIGHER *CHILLS OR SWEATING *NAUSEA AND VOMITING THAT IS NOT CONTROLLED WITH YOUR NAUSEA MEDICATION *UNUSUAL SHORTNESS OF BREATH *UNUSUAL BRUISING OR BLEEDING *URINARY PROBLEMS (pain or burning when urinating, or frequent urination) *BOWEL PROBLEMS (unusual diarrhea, constipation, pain near the anus) TENDERNESS IN MOUTH AND THROAT WITH OR WITHOUT PRESENCE OF ULCERS (sore throat, sores in mouth, or a toothache) UNUSUAL RASH, SWELLING OR PAIN  UNUSUAL VAGINAL DISCHARGE OR ITCHING   Items with * indicate a potential emergency and should be followed up as soon as possible or go to the Emergency Department if any problems should occur.  Please show the CHEMOTHERAPY ALERT CARD or  IMMUNOTHERAPY ALERT CARD at check-in to the Emergency Department and triage nurse.  Should you have questions after your visit or need to cancel or reschedule your appointment, please contact [facility]  Dept: [phone]  and follow the prompts.  Office hours are 8:00 a.m. to 4:30 p.m. Monday - Friday. Please note that voicemails left after 4:00 p.m. may not be returned until the following business day.  We are closed weekends and major holidays. You have access to a nurse at all times for urgent questions. Please call the main number to the clinic Dept: [phone] and follow the prompts.   For any non-urgent questions, you may also contact your provider using MyChart. We now offer e-Visits for anyone 63 and older to request care online for non-urgent symptoms. For details visit [email or web address].   Also download the MyChart app! Go to the app store, search "MyChart", open the app, select [facility], and log in with your MyChart username and password.  Due to Covid, a mask is required upon entering the hospital/clinic. If you do not have a mask, one will be given to you upon arrival. For doctor visits, patients may have 1 support person aged 74 or older with them. For treatment visits, patients cannot have anyone with them due to current Covid guidelines and our immunocompromised population.

## 2021-06-22 NOTE — Progress Notes (Signed)
[facility]   [phone] [phone]   Clinic Follow up Note   Patient Care Team: Pcp, No as PCP - General [name], MD as Consulting Physician (Oncology) [name], RN as Nurse Navigator (Oncology) 06/22/2021  CHIEF COMPLAINT: Follow-up metastatic colon cancer  SUMMARY OF ONCOLOGIC HISTORY: Oncology History Overview Note  Cancer Staging Cancer of right colon [facility]) Staging form: Colon and Rectum - Neuroendocine Tumors, AJCC 8th Edition - Clinical stage from 12/13/2020: Stage IV (cTX, cN1, cM1) - Signed by [name], MD on 03/01/2021    Cancer of right colon ([place])  12/07/2020 Procedure   Colonoscopy  Impression: - non-bleeding internal hemorrhoids - diverticulosis in entire examined colon - one 9 mm polyp in ascending colon removed with hot snare - one 13 mm polyp in transverse colon removed with hot fnare - likely malignant partially-obstructing tumor in cecum.   12/07/2020 Pathology Results   FINAL PATHOLOGIC DIAGNOSIS  SMALL BOWEL, BIOPSIES - duodenal mucosa within normal limits GASTRIC BODY AND ATRIUM, BIOPSY - gastric antral and body-type mucosa with moderate chronic Helicobacter pylori gastritis ASCENDING COLON, POLYPECTOMY - tubular adenoma with prominent eosinophilic infiltrate CECUM, "MASS," BIOPSIES - moderately differentiated adenocarcinoma TRANSVERSE COLON, POLYPECTOMY - high grade dysplasia involving tubulovillous adenoma - cauterized margins negative   12/13/2020 Cancer Staging   Staging form: Colon and Rectum - Neuroendocine Tumors, AJCC 8th Edition - Clinical stage from 12/13/2020: Stage IV (cTX, cN1, cM1) - Signed by [name], MD on 03/01/2021    12/22/2020 Imaging   CT CAP  IMPRESSION: Large 7.5 cm cecal mass with surrounding nodularity/lymphadenopathy 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 The largest measures 6 mm in the RUL. Findings are nonspecific but can be followed per Fleischner criteria Hiatal hernia   03/01/2021 Initial Diagnosis   Cancer of right colon ([place])   03/10/2021 Imaging   CT CAP  IMPRESSION: 1. Large infiltrative cecal mass measuring approximately 7.1 cm, reflecting the known right-sided colonic neoplasm. 2. Similar extensive peritoneal/omental nodularity throughout the abdomen and pelvis with trace free fluid in the pelvis along the pericolic gutters, consistent with disease involvement. 3. Similar prominent and mildly enlarged retroperitoneal and mesenteric lymph nodes, most likely reflecting disease involvement. 4. Numerous scattered bilateral pulmonary nodules measuring up to 6 mm are similar to prior and nonspecific but suspicious for disease involvement. Attention on follow-up imaging suggested. 5. Similar mild right-sided hydronephrosis without hydroureter, with the distal ureter traversing peritoneal nodularity. Likely reflecting partial UPJ obstruction given lack of hydroureter. Attention on follow-up imaging suggested. Cholelithiasis without findings of acute cholecystitis. 6.  Aortic Atherosclerosis (ICD10-I70.0).   03/13/2021 Pathology Results   FINAL MICROSCOPIC DIAGNOSIS:   A. OMENTUM, NEEDLE CORE BIOPSY:  - Metastatic adenocarcinoma, consistent with clinical impression of a  colonic primary.  See comment    03/13/2021 Miscellaneous      03/16/2021 -  Chemotherapy   Patient is on Treatment Plan : COLORECTAL FOLFOX + Bevacizumab q14d     06/05/2021 Imaging   CT CAP  IMPRESSION: Chest Impression:   1. Bilateral small pulmonary nodules appear unchanged. 2. No new nodularity. 3. No metastatic adenopathy.   Abdomen / Pelvis Impression:   1. Marked decrease in  volume of circumferential mass surrounding the distal ileum leading up the terminal ileum. 2. Marked decrease in  size of peritoneal implants along the ventral peritoneal surface of the lower abdomen. Persistent nodularity again much improved. 3. No evidence of liver metastasis.     CURRENT THERAPY: First-line FOLFOX and bevacizumab every 2 weeks, starting 03/16/2021  INTERVAL HISTORY: Ms. Trevor returns for follow-up and treatment as scheduled.  Last seen 06/07/2021 and completed cycle 7 FOLFOX/Beva.  Upon completing treatment and standing up to leave she became lightheaded and couldn't breath, vitals showed tachycardia, no hypoxia.  She appeared anxious.  She was wrapped in a warm blanket by nursing, required no further medical intervention and returned to baseline for discharge home.  This was felt to be likely cold sensitivity from oxaliplatin rather than a true allergy rxn.  This is caused her to feel anxious at home, almost panicked, worrying about the next treatments.  She is doing well today except nervous.  She reports cold sensitivity and intermittent mild neuropathy for 1.5 weeks following treatment, resolves when she wraps her hands and warmth.  She can function normally, not dropping things.  Energy and appetite are low for first few days then recovers. Bowels fluctuate between constipation and diarrhea for up to week.  Denies nausea/vomiting, mucositis, rash, bleeding, fever, chills, cough, chest pain, dyspnea, or new or worsening pain.   MEDICAL HISTORY:  Past Medical History:  Diagnosis Date   Anemia    Colon cancer ([place])    cecum   Heart murmur    Hypertension     SURGICAL HISTORY: Past Surgical History:  Procedure Laterality Date   ABDOMINAL HYSTERECTOMY     IR IMAGING GUIDED PORT INSERTION  03/13/2021    I have reviewed the social history and family history with the patient and they are unchanged from previous note.  ALLERGIES:  is allergic to demerol [meperidine  hcl].  MEDICATIONS:  Current Outpatient Medications  Medication Sig Dispense Refill   LORazepam (ATIVAN) 0.5 MG tablet Take 1 tablet (0.5 mg total) by mouth every 8 (eight) hours. 30 tablet 0   ondansetron (ZOFRAN) 8 MG tablet Take 1 tablet (8 mg total) by mouth every 8 (eight) hours as needed for nausea or vomiting. 20 tablet 1   prochlorperazine (COMPAZINE) 10 MG tablet Take 1 tablet (10 mg total) by mouth every 6 (six) hours as needed for nausea or vomiting. 30 tablet 1   amLODipine (NORVASC) 10 MG tablet Take 10 mg by mouth daily.     lidocaine-prilocaine (EMLA) cream Apply 1 application topically as needed. 30 g 4   lisinopril (ZESTRIL) 5 MG tablet Take 5 mg by mouth daily.     pantoprazole (PROTONIX) 40 MG tablet TAKE 1 TABLET BY MOUTH EVERY DAY 90 tablet 1   potassium chloride SA (KLOR-CON) 20 MEQ tablet Take 1 tablet (20 mEq total) by mouth 2 (two) times daily. 20 tablet 0   sucralfate (CARAFATE) 1 GM/10ML suspension Take 10 mLs (1 g total) by mouth 4 (four) times daily -  with meals and at bedtime. 420 mL 0   Current Facility-Administered Medications  Medication Dose Route Frequency Provider Last Rate Last Admin   LORazepam (ATIVAN) tablet 0.5 mg  0.5 mg Oral Once Alla Feeling, NP       Facility-Administered Medications Ordered in Other Visits  Medication Dose Route Frequency Provider Last Rate Last Admin   fluorouracil (ADRUCIL) 4,200 mg in sodium chloride 0.9 % 66 mL chemo infusion  2,400 mg/m2 (Treatment Plan Recorded) Intravenous 1 day or 1 dose [name], MD  heparin lock flush 100 unit/mL  500 Units Intracatheter Once PRN [name], MD       leucovorin 696 mg in dextrose 5 % 250 mL infusion  400 mg/m2 (Treatment Plan Recorded) Intravenous Once [name], MD 95 mL/hr at 06/22/21 1316 696 mg at 06/22/21 1316   oxaliplatin (ELOXATIN) 120 mg in dextrose 5 % 500 mL chemo infusion  70 mg/m2 (Treatment Plan Recorded) Intravenous Once [name], MD 175 mL/hr at 06/22/21 1314  120 mg at 06/22/21 1314   sodium chloride flush (NS) 0.9 % injection 10 mL  10 mL Intracatheter PRN [name], MD        PHYSICAL EXAMINATION: ECOG PERFORMANCE STATUS: 1 - Symptomatic but completely ambulatory  Vitals:   06/22/21 1019  BP: (!) 145/77  Pulse: 92  Resp: 18  Temp: 98.2 F (36.8 C)  SpO2: 100%   Filed Weights   06/22/21 1019  Weight: 132 lb 4.8 oz (60 kg)    GENERAL:alert, no distress and comfortable SKIN: Palms with mild hyperpigmentation, skin without rash EYES: sclera clear LUNGS:  normal breathing effort HEART: no lower extremity edema NEURO: alert & oriented x 3 with fluent speech, no focal motor/sensory deficits. Intact peripheral vibratory sense over the fingertips per tuning fork exam PAC without erythema  LABORATORY DATA:  I have reviewed the data as listed CBC Latest Ref Rng & Units 06/22/2021 06/07/2021 05/25/2021  WBC 4.0 - 10.5 K/uL 6.0 5.1 2.2(L)  Hemoglobin 12.0 - 15.0 g/dL 9.3(L) 9.9(L) 9.5(L)  Hematocrit 36.0 - 46.0 % 28.9(L) 31.1(L) 30.3(L)  Platelets 150 - 400 K/uL 187 131(L) 231     CMP Latest Ref Rng & Units 06/22/2021 06/07/2021 05/25/2021  Glucose 70 - 99 mg/dL 98 103(H) 101(H)  BUN 8 - 23 mg/dL 6(L) 5(L) 5(L)  Creatinine 0.44 - 1.00 mg/dL 0.58 0.58 0.41(L)  Sodium 135 - 145 mmol/L 140 140 138  Potassium 3.5 - 5.1 mmol/L 3.4(L) 3.1(L) 3.4(L)  Chloride 98 - 111 mmol/L 104 103 101  CO2 22 - 32 mmol/L $RemoveB'24 24 27  'dDHnWrFX$ Calcium 8.9 - 10.3 mg/dL 8.9 9.1 9.1  Total Protein 6.5 - 8.1 g/dL 7.2 7.3 7.8  Total Bilirubin 0.3 - 1.2 mg/dL 0.2(L) 0.3 0.3  Alkaline Phos 38 - 126 U/L 111 106 74  AST 15 - 41 U/L 12(L) 12(L) 12(L)  ALT 0 - 44 U/L $Remo'6 6 9      'mBkua$ RADIOGRAPHIC STUDIES: I have personally reviewed the radiological images as listed and agreed with the findings in the report. No results found.   ASSESSMENT & PLAN: Flecia Shutter is a 70 y.o. female with    1. 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 Work up with EGD and colonoscopy on 12/07/20 revealed a large cecal mass. Biopsy confirmed adenocarcinoma.  -She underwent repeat CT CAP on 03/10/21 showing: 7.1 cm cecal mass; similar extensive peritoneal/omental nodularity; similar prominent and mildly enlarged retroperitoneal and mesenteric lymph nodes; numerous scattered bilateral pulmonary nodules; similar mild right-sided hydronephrosis. -Omental biopsy 03/13/21 confirmed metastatic adenocarcinoma, consistent with colon primary.   -port placed on 03/13/21, began first line FOLFOX 03/16/21, tolerated well with mild diarrhea -FO showed MSI stable disease, low mutation burden 4 muts/Mb, K-ras positive, bevacizumab was added with C2 (03/29/21) -CT CAP 06/05/2021 showed good response, continue current regimen   2. Cold sensitivity rxn 06/07/21 -after completing oxali she stood up and was lightheaded and could not breath -tachy,  other VS stable -seen by [facility] PA, supported with warm blanket. Sx resolved -likely related to oxali but not allergic reaction -anxious today, given ativan   3. Anemia, iron deficiency  -Likely secondary to #1 -She received IV iron Venofer 200 mg on 8/1, 8/4, 8/8, 8/15.  Next scheduled dose 8/22 -She has constipation from iron infusion, we reviewed symptom management -Anemia improving   4.  Goal of care discussion  -She understands the incurable nature of her stage IV cancer diagnosis  -Treatment goal is palliative, to control her disease, improve her symptoms, and prolong her life -she is full code now    Disposition: Ms. Linch appears stable.  She continues FOLFOX and bevacizumab, she tolerates treatment with cold sensitivity, low appetite, and bowel fluctuations from constipation to diarrhea.  Side effects last 1-1.5 weeks and are adequately managed with supportive care at home, she is able to recover and function well.  There is no clinical evidence of disease  progression.  Labs reviewed.  I reviewed the notes from her previous Oxali infusion and discussed with her treating nurse today.  She will receive Ativan p.o. x1 and infusion for anxiety and was given a prescription for home.  She will proceed with treatment today as planned.    She will return for follow-up and treatment in 3, 5, and 8 weeks due to the upcoming holidays.  All questions were answered. The patient knows to call the clinic with any problems, questions or concerns. No barriers to learning were detected.  Total encounter time is 30 minutes.     Alla Feeling, NP 06/22/21

## 2021-06-23 ENCOUNTER — Inpatient Hospital Stay: Payer: Medicare HMO

## 2021-06-23 NOTE — Progress Notes (Signed)
Patient had improper dressing for a pump instillation changed dressing

## 2021-06-24 ENCOUNTER — Other Ambulatory Visit: Payer: Self-pay

## 2021-06-24 ENCOUNTER — Inpatient Hospital Stay: Payer: Medicare HMO

## 2021-06-24 VITALS — BP 129/72 | HR 75 | Temp 98.5°F | Resp 16

## 2021-06-24 DIAGNOSIS — C182 Malignant neoplasm of ascending colon: Secondary | ICD-10-CM

## 2021-06-24 DIAGNOSIS — Z5112 Encounter for antineoplastic immunotherapy: Secondary | ICD-10-CM | POA: Diagnosis not present

## 2021-06-24 MED ORDER — HEPARIN SOD (PORK) LOCK FLUSH 100 UNIT/ML IV SOLN
500.0000 [IU] | Freq: Once | INTRAVENOUS | Status: AC | PRN
  Administered 2021-06-24: 500 [IU]

## 2021-06-24 MED ORDER — PEGFILGRASTIM-CBQV 6 MG/0.6ML ~~LOC~~ SOSY
6.0000 mg | PREFILLED_SYRINGE | Freq: Once | SUBCUTANEOUS | Status: AC
  Administered 2021-06-24: 6 mg via SUBCUTANEOUS

## 2021-06-24 MED ORDER — SODIUM CHLORIDE 0.9% FLUSH
10.0000 mL | INTRAVENOUS | Status: DC | PRN
  Administered 2021-06-24: 10 mL

## 2021-06-24 NOTE — Patient Instructions (Signed)
TAKE ONE CLARITIN TABLET ONCE A DAY TODAY, AND THE NEXT 2 DAYS FOR BONE PAIN/ACHES RELATED TO THIS SHOT.

## 2021-06-26 ENCOUNTER — Telehealth: Payer: Self-pay | Admitting: Hematology

## 2021-06-26 NOTE — Telephone Encounter (Signed)
Scheduled follow-up appointment per 11/10 los. Patient is aware. 

## 2021-07-09 NOTE — Progress Notes (Signed)
[place]   Telephone:([phone]) [phone] Fax:(336) [phone]   Clinic Follow up Note   Patient Care Team: Pcp, No as PCP - General [name], MD as Consulting Physician (Oncology) [name], RN as Nurse Navigator (Oncology) 07/12/2021  CHIEF COMPLAINT: Follow up metastatic colon cancer   SUMMARY OF ONCOLOGIC HISTORY: Oncology History Overview Note  Cancer Staging Cancer of right colon [facility]) Staging form: Colon and Rectum - Neuroendocine Tumors, AJCC 8th Edition - Clinical stage from 12/13/2020: Stage IV (cTX, cN1, cM1) - Signed by [name], MD on 03/01/2021    Cancer of right colon ([place])  12/07/2020 Procedure   Colonoscopy  Impression: - non-bleeding internal hemorrhoids - diverticulosis in entire examined colon - one 9 mm polyp in ascending colon removed with hot snare - one 13 mm polyp in transverse colon removed with hot fnare - likely malignant partially-obstructing tumor in cecum.   12/07/2020 Pathology Results   FINAL PATHOLOGIC DIAGNOSIS  SMALL BOWEL, BIOPSIES - duodenal mucosa within normal limits GASTRIC BODY AND ATRIUM, BIOPSY - gastric antral and body-type mucosa with moderate chronic Helicobacter pylori gastritis ASCENDING COLON, POLYPECTOMY - tubular adenoma with prominent eosinophilic infiltrate CECUM, "MASS," BIOPSIES - moderately differentiated adenocarcinoma TRANSVERSE COLON, POLYPECTOMY - high grade dysplasia involving tubulovillous adenoma - cauterized margins negative   12/13/2020 Cancer Staging   Staging form: Colon and Rectum - Neuroendocine Tumors, AJCC 8th Edition - Clinical stage from 12/13/2020: Stage IV (cTX, cN1, cM1) - Signed by [name], MD on 03/01/2021    12/22/2020 Imaging   CT CAP  IMPRESSION: Large 7.5 cm cecal mass with surrounding nodularity/lymphadenopathy 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 The largest measures 6 mm in the RUL. Findings are nonspecific but can be followed per Fleischner criteria Hiatal hernia   03/01/2021 Initial Diagnosis   Cancer of right colon ([place])   03/10/2021 Imaging   CT CAP  IMPRESSION: 1. Large infiltrative cecal mass measuring approximately 7.1 cm, reflecting the known right-sided colonic neoplasm. 2. Similar extensive peritoneal/omental nodularity throughout the abdomen and pelvis with trace free fluid in the pelvis along the pericolic gutters, consistent with disease involvement. 3. Similar prominent and mildly enlarged retroperitoneal and mesenteric lymph nodes, most likely reflecting disease involvement. 4. Numerous scattered bilateral pulmonary nodules measuring up to 6 mm are similar to prior and nonspecific but suspicious for disease involvement. Attention on follow-up imaging suggested. 5. Similar mild right-sided hydronephrosis without hydroureter, with the distal ureter traversing peritoneal nodularity. Likely reflecting partial UPJ obstruction given lack of hydroureter. Attention on follow-up imaging suggested. Cholelithiasis without findings of acute cholecystitis. 6.  Aortic Atherosclerosis (ICD10-I70.0).   03/13/2021 Pathology Results   FINAL MICROSCOPIC DIAGNOSIS:   A. OMENTUM, NEEDLE CORE BIOPSY:  - Metastatic adenocarcinoma, consistent with clinical impression of a  colonic primary.  See comment    03/13/2021 Miscellaneous      03/16/2021 -  Chemotherapy   Patient is on Treatment Plan : COLORECTAL FOLFOX + Bevacizumab q14d     06/05/2021 Imaging   CT CAP  IMPRESSION: Chest Impression:   1. Bilateral small pulmonary nodules appear unchanged. 2. No new nodularity. 3. No metastatic adenopathy.   Abdomen / Pelvis Impression:   1. Marked decrease in  volume of circumferential mass surrounding the distal ileum leading up the terminal ileum. 2. Marked  decrease in size of peritoneal implants along the ventral peritoneal surface of the lower abdomen. Persistent nodularity again much improved. 3. No evidence of liver metastasis.     CURRENT THERAPY: First line FOLFOX and beva q2 weeks, starting 03/16/21  INTERVAL HISTORY: Kari Patel returns for follow up and treatment as scheduled. Last seen 06/22/21 and completed cycle 8 FOLFOX/beva.  She received low-dose Ativan with treatment and had no recurrent episodes/issues with oxaliplatin.  She also did not require additional Ativan at home for anxiety.  She tolerated this cycle better, less nausea and diarrhea, only for 3 days but not as bad.  Cold sensitivity do not bother her, her fingers tingle twice and feet only with cold.  Appetite and energy are low for 2-3 days then improves.  She would like to skip her next treatment so she does not feel bad over Christmas.  Denies new or worsening pain, bloating, mucositis, bleeding, fever, chills, cough, chest pain, dyspnea, leg edema, or any other complaints.   MEDICAL HISTORY:  Past Medical History:  Diagnosis Date   Anemia    Colon cancer ([place])    cecum   Heart murmur    Hypertension     SURGICAL HISTORY: Past Surgical History:  Procedure Laterality Date   ABDOMINAL HYSTERECTOMY     IR IMAGING GUIDED PORT INSERTION  03/13/2021    I have reviewed the social history and family history with the patient and they are unchanged from previous note.  ALLERGIES:  is allergic to demerol [meperidine hcl].  MEDICATIONS:  Current Outpatient Medications  Medication Sig Dispense Refill   ondansetron (ZOFRAN) 8 MG tablet Take 1 tablet (8 mg total) by mouth every 8 (eight) hours as needed for nausea or vomiting. 20 tablet 1   prochlorperazine (COMPAZINE) 10 MG tablet Take 1 tablet (10 mg total) by mouth every 6 (six) hours as needed for nausea or vomiting. 30  tablet 1   amLODipine (NORVASC) 10 MG tablet Take 10 mg by mouth daily.     lidocaine-prilocaine (EMLA) cream Apply 1 application topically as needed. 30 g 4   lisinopril (ZESTRIL) 5 MG tablet Take 5 mg by mouth daily.     LORazepam (ATIVAN) 0.5 MG tablet Take 1 tablet (0.5 mg total) by mouth every 8 (eight) hours. 30 tablet 0   pantoprazole (PROTONIX) 40 MG tablet TAKE 1 TABLET BY MOUTH EVERY DAY 90 tablet 1   potassium chloride SA (KLOR-CON) 20 MEQ tablet Take 1 tablet (20 mEq total) by mouth 2 (two) times daily. 20 tablet 0   sucralfate (CARAFATE) 1 GM/10ML suspension Take 10 mLs (1 g total) by mouth 4 (four) times daily -  with meals and at bedtime. 420 mL 0   No current facility-administered medications for this visit.   Facility-Administered Medications Ordered in Other Visits  Medication Dose Route Frequency Provider Last Rate Last Admin   bevacizumab-bvzr (ZIRABEV) 300 mg in sodium chloride 0.9 % 100 mL chemo infusion  5 mg/kg (Order-Specific) Intravenous Once [name], MD       dexamethasone (DECADRON) 10 mg in sodium chloride 0.9 % 50 mL IVPB  10 mg Intravenous Once [name], MD 204 mL/hr at 07/12/21 1053 10 mg at 07/12/21 1053   dextrose 5 % solution   Intravenous Once [name], MD       fluorouracil (ADRUCIL) 4,200 mg in sodium chloride 0.9 % 66 mL chemo infusion  2,400 mg/m2 (Treatment Plan Recorded) Intravenous 1 day or 1 dose [name], MD  leucovorin 696 mg in dextrose 5 % 250 mL infusion  400 mg/m2 (Treatment Plan Recorded) Intravenous Once [name], MD       oxaliplatin (ELOXATIN) 120 mg in dextrose 5 % 500 mL chemo infusion  70 mg/m2 (Treatment Plan Recorded) Intravenous Once [name], MD       sodium chloride flush (NS) 0.9 % injection 10 mL  10 mL Intracatheter PRN [name], MD        PHYSICAL EXAMINATION: ECOG PERFORMANCE STATUS: 1 - Symptomatic but completely ambulatory  Vitals:   07/12/21 0927  BP: 124/74  Pulse: 74  Resp: 18  Temp: 98.1 F (36.7 C)   SpO2: 100%   Filed Weights   07/12/21 0927  Weight: 128 lb 12.8 oz (58.4 kg)    GENERAL:alert, no distress and comfortable SKIN: no rash  EYES: sclera clear LUNGS: with normal breathing effort HEART: no lower extremity edema NEURO: alert & oriented x 3 with fluent speech, no focal motor/sensory deficits PAC without erythema   LABORATORY DATA:  I have reviewed the data as listed CBC Latest Ref Rng & Units 07/12/2021 06/22/2021 06/07/2021  WBC 4.0 - 10.5 K/uL 4.2 6.0 5.1  Hemoglobin 12.0 - 15.0 g/dL 9.4(L) 9.3(L) 9.9(L)  Hematocrit 36.0 - 46.0 % 29.0(L) 28.9(L) 31.1(L)  Platelets 150 - 400 K/uL 210 187 131(L)     CMP Latest Ref Rng & Units 07/12/2021 06/22/2021 06/07/2021  Glucose 70 - 99 mg/dL 102(H) 98 103(H)  BUN 8 - 23 mg/dL 6(L) 6(L) 5(L)  Creatinine 0.44 - 1.00 mg/dL 0.55 0.58 0.58  Sodium 135 - 145 mmol/L 137 140 140  Potassium 3.5 - 5.1 mmol/L 4.0 3.4(L) 3.1(L)  Chloride 98 - 111 mmol/L 106 104 103  CO2 22 - 32 mmol/L $RemoveB'24 24 24  'pTYCWTYJ$ Calcium 8.9 - 10.3 mg/dL 9.1 8.9 9.1  Total Protein 6.5 - 8.1 g/dL 7.0 7.2 7.3  Total Bilirubin 0.3 - 1.2 mg/dL 0.4 0.2(L) 0.3  Alkaline Phos 38 - 126 U/L 84 111 106  AST 15 - 41 U/L 18 12(L) 12(L)  ALT 0 - 44 U/L $Remo'9 6 6      'dsxFw$ RADIOGRAPHIC STUDIES: I have personally reviewed the radiological images as listed and agreed with the findings in the report. No results found.   ASSESSMENT & PLAN: Kari Patel is a 70 y.o. female with    1. 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 Work up with EGD and colonoscopy on 12/07/20 revealed a large cecal mass. Biopsy confirmed adenocarcinoma.  -She underwent repeat CT CAP on 03/10/21 showing: 7.1 cm cecal mass; similar extensive peritoneal/omental nodularity; similar prominent and mildly enlarged retroperitoneal and mesenteric lymph nodes; numerous scattered bilateral pulmonary nodules; similar mild right-sided  hydronephrosis. -Omental biopsy 03/13/21 confirmed metastatic adenocarcinoma, consistent with colon primary.   -port placed on 03/13/21, began first line FOLFOX 03/16/21, tolerated well with mild diarrhea -FO showed MSI stable disease, low mutation burden 4 muts/Mb, K-ras positive, bevacizumab was added with C2 (03/29/21) -CT CAP 06/05/2021 showed good response, CEA trending down. continue current regimen   2. Cold sensitivity rxn 06/07/21 -after completing oxali she stood up and was lightheaded and could not breath -tachy, other VS stable -seen by [facility], supported with warm blanket. Sx resolved -likely related to oxali but not allergic reaction -she was given low dose ativan with treatment on 06/22/21 and had no recurrent issues. Will continue ativan with treatment   3.  Anemia, iron deficiency  -Likely secondary to #1 -She received IV iron Venofer 200 mg on 8/1, 8/4, 8/8, 8/15.  Next scheduled dose 8/22 -She has constipation from iron infusion, we reviewed symptom management -Anemia is stable   4.  Goal of care discussion  -She understands the incurable nature of her stage IV cancer diagnosis  -Treatment goal is palliative, to control her disease, improve her symptoms, and prolong her life -she is full code now   Disposition:  Ms. Faubert appears stable. She continues FOLFOX/beva, tolerating well with mild fatigue, low appetite, nausea, diarrhea, and cold sensitivity. Side effects were more mild this time, adequately managed with supportive care at home. She is able to recover and function well. There is no clinical evidence of progression.   Labs reviewed, CEA is pending, adequate for treatment today as planned, same dose. Will check iron next visit to see if she needs additional IV iron.   She is requesting treatment break for Christmas. She will return for lab, f/up, and next cycle FOLFOX/beva on 08/09/21.   Orders Placed This Encounter  Procedures   Iron and TIBC    Standing Status:    Standing    Number of Occurrences:   1    Standing Expiration Date:   07/12/2022   Ferritin    Standing Status:   Standing    Number of Occurrences:   1    Standing Expiration Date:   07/12/2022    All questions were answered. The patient knows to call the clinic with any problems, questions or concerns. No barriers to learning were detected.     Alla Feeling, NP 07/12/21

## 2021-07-12 ENCOUNTER — Inpatient Hospital Stay: Payer: Medicare HMO

## 2021-07-12 ENCOUNTER — Other Ambulatory Visit: Payer: Self-pay

## 2021-07-12 ENCOUNTER — Inpatient Hospital Stay (HOSPITAL_BASED_OUTPATIENT_CLINIC_OR_DEPARTMENT_OTHER): Payer: Medicare HMO | Admitting: Nurse Practitioner

## 2021-07-12 ENCOUNTER — Encounter: Payer: Self-pay | Admitting: Nurse Practitioner

## 2021-07-12 VITALS — BP 124/74 | HR 74 | Temp 98.1°F | Resp 18 | Ht 63.0 in | Wt 128.8 lb

## 2021-07-12 DIAGNOSIS — C182 Malignant neoplasm of ascending colon: Secondary | ICD-10-CM

## 2021-07-12 DIAGNOSIS — Z5112 Encounter for antineoplastic immunotherapy: Secondary | ICD-10-CM | POA: Diagnosis not present

## 2021-07-12 DIAGNOSIS — D500 Iron deficiency anemia secondary to blood loss (chronic): Secondary | ICD-10-CM | POA: Diagnosis not present

## 2021-07-12 DIAGNOSIS — Z95828 Presence of other vascular implants and grafts: Secondary | ICD-10-CM

## 2021-07-12 LAB — CBC WITH DIFFERENTIAL (CANCER CENTER ONLY)
Abs Immature Granulocytes: 0.03 10*3/uL (ref 0.00–0.07)
Basophils Absolute: 0 10*3/uL (ref 0.0–0.1)
Basophils Relative: 1 %
Eosinophils Absolute: 0.1 10*3/uL (ref 0.0–0.5)
Eosinophils Relative: 2 %
HCT: 29 % — ABNORMAL LOW (ref 36.0–46.0)
Hemoglobin: 9.4 g/dL — ABNORMAL LOW (ref 12.0–15.0)
Immature Granulocytes: 1 %
Lymphocytes Relative: 24 %
Lymphs Abs: 1 10*3/uL (ref 0.7–4.0)
MCH: 30.7 pg (ref 26.0–34.0)
MCHC: 32.4 g/dL (ref 30.0–36.0)
MCV: 94.8 fL (ref 80.0–100.0)
Monocytes Absolute: 0.4 10*3/uL (ref 0.1–1.0)
Monocytes Relative: 10 %
Neutro Abs: 2.6 10*3/uL (ref 1.7–7.7)
Neutrophils Relative %: 62 %
Platelet Count: 210 10*3/uL (ref 150–400)
RBC: 3.06 MIL/uL — ABNORMAL LOW (ref 3.87–5.11)
RDW: 19 % — ABNORMAL HIGH (ref 11.5–15.5)
WBC Count: 4.2 10*3/uL (ref 4.0–10.5)
nRBC: 0 % (ref 0.0–0.2)

## 2021-07-12 LAB — CMP (CANCER CENTER ONLY)
ALT: 9 U/L (ref 0–44)
AST: 18 U/L (ref 15–41)
Albumin: 3.5 g/dL (ref 3.5–5.0)
Alkaline Phosphatase: 84 U/L (ref 38–126)
Anion gap: 7 (ref 5–15)
BUN: 6 mg/dL — ABNORMAL LOW (ref 8–23)
CO2: 24 mmol/L (ref 22–32)
Calcium: 9.1 mg/dL (ref 8.9–10.3)
Chloride: 106 mmol/L (ref 98–111)
Creatinine: 0.55 mg/dL (ref 0.44–1.00)
GFR, Estimated: >60 mL/min (ref >60)
Glucose, Bld: 102 mg/dL — ABNORMAL HIGH (ref 70–99)
Potassium: 4 mmol/L (ref 3.5–5.1)
Sodium: 137 mmol/L (ref 135–145)
Total Bilirubin: 0.4 mg/dL (ref 0.3–1.2)
Total Protein: 7 g/dL (ref 6.5–8.1)

## 2021-07-12 LAB — TOTAL PROTEIN, URINE DIPSTICK: Protein, ur: NEGATIVE mg/dL

## 2021-07-12 LAB — CEA (IN HOUSE-CHCC): CEA (CHCC-In House): 5.61 ng/mL — ABNORMAL HIGH (ref 0.00–5.00)

## 2021-07-12 MED ORDER — SODIUM CHLORIDE 0.9 % IV SOLN
Freq: Once | INTRAVENOUS | Status: AC
Start: 2021-07-12 — End: 2021-07-12

## 2021-07-12 MED ORDER — ALBUTEROL SULFATE (2.5 MG/3ML) 0.083% IN NEBU: 2.5000 mg | INHALATION_SOLUTION | Freq: Once | RESPIRATORY_TRACT | Status: DC | PRN

## 2021-07-12 MED ORDER — PALONOSETRON HCL INJECTION 0.25 MG/5ML
0.2500 mg | Freq: Once | INTRAVENOUS | Status: AC
  Administered 2021-07-12: 0.25 mg via INTRAVENOUS
  Filled 2021-07-12: qty 5

## 2021-07-12 MED ORDER — SODIUM CHLORIDE 0.9 % IV SOLN
10.0000 mg | Freq: Once | INTRAVENOUS | Status: AC
  Administered 2021-07-12: 10 mg via INTRAVENOUS
  Filled 2021-07-12: qty 10

## 2021-07-12 MED ORDER — EPINEPHRINE 0.3 MG/0.3ML IJ SOAJ: 0.3000 mg | Freq: Once | INTRAMUSCULAR | Status: DC | PRN

## 2021-07-12 MED ORDER — FAMOTIDINE 20 MG IN NS 100 ML IVPB: 20.0000 mg | Freq: Once | INTRAVENOUS | Status: DC | PRN

## 2021-07-12 MED ORDER — SODIUM CHLORIDE 0.9 % IV SOLN
5.0000 mg/kg | Freq: Once | INTRAVENOUS | Status: AC
  Administered 2021-07-12: 300 mg via INTRAVENOUS
  Filled 2021-07-12: qty 12

## 2021-07-12 MED ORDER — SODIUM CHLORIDE 0.9 % IV SOLN: 300.0000 mg | Freq: Once | INTRAVENOUS | Status: DC

## 2021-07-12 MED ORDER — LORAZEPAM 1 MG PO TABS
0.5000 mg | ORAL_TABLET | Freq: Once | ORAL | Status: AC
  Administered 2021-07-12: 0.5 mg via ORAL
  Filled 2021-07-12: qty 1

## 2021-07-12 MED ORDER — HEPARIN SOD (PORK) LOCK FLUSH 100 UNIT/ML IV SOLN
500.0000 [IU] | Freq: Once | INTRAVENOUS | Status: AC
Start: 2021-07-12 — End: 2021-07-12
  Administered 2021-07-12: 500 [IU]

## 2021-07-12 MED ORDER — HEPARIN SOD (PORK) LOCK FLUSH 100 UNIT/ML IV SOLN: 250.0000 [IU] | Freq: Once | INTRAVENOUS | Status: DC | PRN

## 2021-07-12 MED ORDER — HEPARIN SOD (PORK) LOCK FLUSH 100 UNIT/ML IV SOLN: 500.0000 [IU] | Freq: Once | INTRAVENOUS | Status: DC | PRN

## 2021-07-12 MED ORDER — SODIUM CHLORIDE 0.9% FLUSH
10.0000 mL | INTRAVENOUS | Status: DC | PRN
  Administered 2021-07-12: 10 mL

## 2021-07-12 MED ORDER — OXALIPLATIN CHEMO INJECTION 100 MG/20ML
70.0000 mg/m2 | Freq: Once | INTRAVENOUS | Status: AC
  Administered 2021-07-12: 120 mg via INTRAVENOUS
  Filled 2021-07-12: qty 20

## 2021-07-12 MED ORDER — SODIUM CHLORIDE 0.9% FLUSH: 3.0000 mL | Freq: Once | INTRAVENOUS | Status: DC | PRN

## 2021-07-12 MED ORDER — ALTEPLASE 2 MG IJ SOLR: 2.0000 mg | Freq: Once | INTRAMUSCULAR | Status: DC | PRN

## 2021-07-12 MED ORDER — SODIUM CHLORIDE 0.9 % IV SOLN
2400.0000 mg/m2 | INTRAVENOUS | Status: DC
  Administered 2021-07-12: 4200 mg via INTRAVENOUS
  Filled 2021-07-12: qty 84

## 2021-07-12 MED ORDER — FAMOTIDINE 20 MG IN NS 100 ML IVPB
20.0000 mg | Freq: Once | INTRAVENOUS | Status: AC
  Administered 2021-07-12: 20 mg via INTRAVENOUS
  Filled 2021-07-12: qty 100

## 2021-07-12 MED ORDER — DIPHENHYDRAMINE HCL 50 MG/ML IJ SOLN: 50.0000 mg | Freq: Once | INTRAMUSCULAR | Status: DC | PRN

## 2021-07-12 MED ORDER — LEUCOVORIN CALCIUM INJECTION 350 MG
400.0000 mg/m2 | Freq: Once | INTRAVENOUS | Status: AC
  Administered 2021-07-12: 696 mg via INTRAVENOUS
  Filled 2021-07-12: qty 25

## 2021-07-12 MED ORDER — SODIUM CHLORIDE 0.9% FLUSH
10.0000 mL | Freq: Once | INTRAVENOUS | Status: AC
  Administered 2021-07-12: 10 mL

## 2021-07-12 MED ORDER — SODIUM CHLORIDE 0.9 % IV SOLN: Freq: Once | INTRAVENOUS | Status: DC

## 2021-07-12 MED ORDER — METHYLPREDNISOLONE SODIUM SUCC 125 MG IJ SOLR: 125.0000 mg | Freq: Once | INTRAMUSCULAR | Status: DC | PRN

## 2021-07-12 MED ORDER — SODIUM CHLORIDE 0.9 % IV SOLN: Freq: Once | INTRAVENOUS | Status: DC | PRN

## 2021-07-12 MED ORDER — DEXTROSE 5 % IV SOLN: Freq: Once | INTRAVENOUS | Status: AC

## 2021-07-12 NOTE — Patient Instructions (Signed)
[facility] [place] ONCOLOGY  Discharge Instructions: Thank you for choosing [facility] to provide your oncology and hematology care.   If you have a lab appointment with the [facility], please go directly to the [facility] and check in at the registration area.   Wear comfortable clothing and clothing appropriate for easy access to any Portacath or PICC line.   We strive to give you quality time with your provider. You may need to reschedule your appointment if you arrive late (15 or more minutes).  Arriving late affects you and other patients whose appointments are after yours.  Also, if you miss three or more appointments without notifying the office, you may be dismissed from the clinic at the provider's discretion.      For prescription refill requests, have your pharmacy contact our office and allow 72 hours for refills to be completed.    Today you received the following chemotherapy and/or immunotherapy agents: Zirabev, Oxaliplatin, Leucovorin, & Fluorouracil   To help prevent nausea and vomiting after your treatment, we encourage you to take your nausea medication as directed.  BELOW ARE SYMPTOMS THAT SHOULD BE REPORTED IMMEDIATELY: *FEVER GREATER THAN 100.4 F (38 C) OR HIGHER *CHILLS OR SWEATING *NAUSEA AND VOMITING THAT IS NOT CONTROLLED WITH YOUR NAUSEA MEDICATION *UNUSUAL SHORTNESS OF BREATH *UNUSUAL BRUISING OR BLEEDING *URINARY PROBLEMS (pain or burning when urinating, or frequent urination) *BOWEL PROBLEMS (unusual diarrhea, constipation, pain near the anus) TENDERNESS IN MOUTH AND THROAT WITH OR WITHOUT PRESENCE OF ULCERS (sore throat, sores in mouth, or a toothache) UNUSUAL RASH, SWELLING OR PAIN  UNUSUAL VAGINAL DISCHARGE OR ITCHING   Items with * indicate a potential emergency and should be followed up as soon as possible or go to the Emergency Department if any problems should occur.  Please show the CHEMOTHERAPY ALERT CARD or  IMMUNOTHERAPY ALERT CARD at check-in to the Emergency Department and triage nurse.  Should you have questions after your visit or need to cancel or reschedule your appointment, please contact [facility]  Dept: [phone]  and follow the prompts.  Office hours are 8:00 a.m. to 4:30 p.m. Monday - Friday. Please note that voicemails left after 4:00 p.m. may not be returned until the following business day.  We are closed weekends and major holidays. You have access to a nurse at all times for urgent questions. Please call the main number to the clinic Dept: [phone] and follow the prompts.   For any non-urgent questions, you may also contact your provider using MyChart. We now offer e-Visits for anyone 17 and older to request care online for non-urgent symptoms. For details visit [email or web address].   Also download the MyChart app! Go to the app store, search "MyChart", open the app, select [facility], and log in with your MyChart username and password.  Due to Covid, a mask is required upon entering the hospital/clinic. If you do not have a mask, one will be given to you upon arrival. For doctor visits, patients may have 1 support person aged 27 or older with them. For treatment visits, patients cannot have anyone with them due to current Covid guidelines and our immunocompromised population.

## 2021-07-13 ENCOUNTER — Telehealth: Payer: Self-pay | Admitting: Hematology

## 2021-07-13 NOTE — Telephone Encounter (Signed)
Scheduled follow-up appointments per 11/30 los. Patient is aware.

## 2021-07-14 ENCOUNTER — Inpatient Hospital Stay: Payer: Medicare HMO | Attending: Hematology

## 2021-07-14 ENCOUNTER — Other Ambulatory Visit: Payer: Self-pay

## 2021-07-14 VITALS — BP 133/65 | HR 85 | Temp 98.5°F | Resp 17

## 2021-07-14 DIAGNOSIS — R197 Diarrhea, unspecified: Secondary | ICD-10-CM | POA: Diagnosis not present

## 2021-07-14 DIAGNOSIS — R634 Abnormal weight loss: Secondary | ICD-10-CM | POA: Diagnosis not present

## 2021-07-14 DIAGNOSIS — C180 Malignant neoplasm of cecum: Secondary | ICD-10-CM | POA: Insufficient documentation

## 2021-07-14 DIAGNOSIS — C182 Malignant neoplasm of ascending colon: Secondary | ICD-10-CM

## 2021-07-14 DIAGNOSIS — Z452 Encounter for adjustment and management of vascular access device: Secondary | ICD-10-CM | POA: Diagnosis not present

## 2021-07-14 DIAGNOSIS — Z5189 Encounter for other specified aftercare: Secondary | ICD-10-CM | POA: Insufficient documentation

## 2021-07-14 DIAGNOSIS — Z5112 Encounter for antineoplastic immunotherapy: Secondary | ICD-10-CM | POA: Insufficient documentation

## 2021-07-14 DIAGNOSIS — Z5111 Encounter for antineoplastic chemotherapy: Secondary | ICD-10-CM | POA: Diagnosis present

## 2021-07-14 DIAGNOSIS — D500 Iron deficiency anemia secondary to blood loss (chronic): Secondary | ICD-10-CM

## 2021-07-14 DIAGNOSIS — Z95828 Presence of other vascular implants and grafts: Secondary | ICD-10-CM

## 2021-07-14 DIAGNOSIS — D509 Iron deficiency anemia, unspecified: Secondary | ICD-10-CM | POA: Insufficient documentation

## 2021-07-14 MED ORDER — HEPARIN SOD (PORK) LOCK FLUSH 100 UNIT/ML IV SOLN
500.0000 [IU] | Freq: Once | INTRAVENOUS | Status: AC
  Administered 2021-07-14: 500 [IU]

## 2021-07-14 MED ORDER — SODIUM CHLORIDE 0.9% FLUSH
10.0000 mL | Freq: Once | INTRAVENOUS | Status: AC
  Administered 2021-07-14: 10 mL

## 2021-07-26 ENCOUNTER — Inpatient Hospital Stay: Payer: Medicare HMO | Admitting: Nurse Practitioner

## 2021-07-26 ENCOUNTER — Inpatient Hospital Stay: Payer: Medicare HMO

## 2021-07-28 ENCOUNTER — Inpatient Hospital Stay: Payer: Medicare HMO

## 2021-08-08 ENCOUNTER — Ambulatory Visit: Payer: Medicare HMO | Admitting: Nurse Practitioner

## 2021-08-08 ENCOUNTER — Ambulatory Visit: Payer: Medicare HMO

## 2021-08-08 ENCOUNTER — Other Ambulatory Visit: Payer: Medicare HMO

## 2021-08-09 ENCOUNTER — Other Ambulatory Visit: Payer: Self-pay

## 2021-08-09 DIAGNOSIS — D500 Iron deficiency anemia secondary to blood loss (chronic): Secondary | ICD-10-CM

## 2021-08-10 ENCOUNTER — Other Ambulatory Visit: Payer: Self-pay

## 2021-08-10 ENCOUNTER — Inpatient Hospital Stay: Payer: Medicare HMO

## 2021-08-10 ENCOUNTER — Inpatient Hospital Stay (HOSPITAL_BASED_OUTPATIENT_CLINIC_OR_DEPARTMENT_OTHER): Payer: Medicare HMO | Admitting: Hematology

## 2021-08-10 ENCOUNTER — Encounter: Payer: Self-pay | Admitting: Hematology

## 2021-08-10 VITALS — BP 123/63 | HR 72 | Temp 98.2°F | Resp 16 | Wt 131.0 lb

## 2021-08-10 DIAGNOSIS — Z5112 Encounter for antineoplastic immunotherapy: Secondary | ICD-10-CM | POA: Diagnosis not present

## 2021-08-10 DIAGNOSIS — D500 Iron deficiency anemia secondary to blood loss (chronic): Secondary | ICD-10-CM | POA: Diagnosis not present

## 2021-08-10 DIAGNOSIS — C182 Malignant neoplasm of ascending colon: Secondary | ICD-10-CM

## 2021-08-10 DIAGNOSIS — Z95828 Presence of other vascular implants and grafts: Secondary | ICD-10-CM

## 2021-08-10 LAB — TOTAL PROTEIN, URINE DIPSTICK: Protein, ur: <30 mg/dL — AB

## 2021-08-10 LAB — IRON AND IRON BINDING CAPACITY (CC-WL,HP ONLY)
Iron: 55 ug/dL (ref 28–170)
Saturation Ratios: 19 % (ref 10.4–31.8)
TIBC: 287 ug/dL (ref 250–450)
UIBC: 232 ug/dL (ref 148–442)

## 2021-08-10 LAB — CBC WITH DIFFERENTIAL (CANCER CENTER ONLY)
Abs Immature Granulocytes: 0.01 10*3/uL (ref 0.00–0.07)
Basophils Absolute: 0 10*3/uL (ref 0.0–0.1)
Basophils Relative: 0 %
Eosinophils Absolute: 0.1 10*3/uL (ref 0.0–0.5)
Eosinophils Relative: 2 %
HCT: 31.7 % — ABNORMAL LOW (ref 36.0–46.0)
Hemoglobin: 10.1 g/dL — ABNORMAL LOW (ref 12.0–15.0)
Immature Granulocytes: 0 %
Lymphocytes Relative: 31 %
Lymphs Abs: 0.8 10*3/uL (ref 0.7–4.0)
MCH: 31.4 pg (ref 26.0–34.0)
MCHC: 31.9 g/dL (ref 30.0–36.0)
MCV: 98.4 fL (ref 80.0–100.0)
Monocytes Absolute: 0.3 10*3/uL (ref 0.1–1.0)
Monocytes Relative: 12 %
Neutro Abs: 1.4 10*3/uL — ABNORMAL LOW (ref 1.7–7.7)
Neutrophils Relative %: 55 %
Platelet Count: 146 10*3/uL — ABNORMAL LOW (ref 150–400)
RBC: 3.22 MIL/uL — ABNORMAL LOW (ref 3.87–5.11)
RDW: 15.6 % — ABNORMAL HIGH (ref 11.5–15.5)
WBC Count: 2.6 10*3/uL — ABNORMAL LOW (ref 4.0–10.5)
nRBC: 0 % (ref 0.0–0.2)

## 2021-08-10 LAB — CMP (CANCER CENTER ONLY)
ALT: 5 U/L (ref 0–44)
AST: 11 U/L — ABNORMAL LOW (ref 15–41)
Albumin: 3.8 g/dL (ref 3.5–5.0)
Alkaline Phosphatase: 74 U/L (ref 38–126)
Anion gap: 8 (ref 5–15)
BUN: 7 mg/dL — ABNORMAL LOW (ref 8–23)
CO2: 27 mmol/L (ref 22–32)
Calcium: 9.4 mg/dL (ref 8.9–10.3)
Chloride: 105 mmol/L (ref 98–111)
Creatinine: 0.56 mg/dL (ref 0.44–1.00)
GFR, Estimated: >60 mL/min (ref >60)
Glucose, Bld: 113 mg/dL — ABNORMAL HIGH (ref 70–99)
Potassium: 4.1 mmol/L (ref 3.5–5.1)
Sodium: 140 mmol/L (ref 135–145)
Total Bilirubin: 0.3 mg/dL (ref 0.3–1.2)
Total Protein: 7.3 g/dL (ref 6.5–8.1)

## 2021-08-10 LAB — FERRITIN: Ferritin: 234 ng/mL (ref 11–307)

## 2021-08-10 MED ORDER — ALBUTEROL SULFATE (2.5 MG/3ML) 0.083% IN NEBU: 2.5000 mg | INHALATION_SOLUTION | Freq: Once | RESPIRATORY_TRACT | Status: DC | PRN

## 2021-08-10 MED ORDER — OXALIPLATIN CHEMO INJECTION 100 MG/20ML
70.0000 mg/m2 | Freq: Once | INTRAVENOUS | Status: AC
  Administered 2021-08-10: 13:00:00 120 mg via INTRAVENOUS
  Filled 2021-08-10: qty 20

## 2021-08-10 MED ORDER — SODIUM CHLORIDE 0.9% FLUSH: 3.0000 mL | Freq: Once | INTRAVENOUS | Status: DC | PRN

## 2021-08-10 MED ORDER — EPINEPHRINE 0.3 MG/0.3ML IJ SOAJ: 0.3000 mg | Freq: Once | INTRAMUSCULAR | Status: DC | PRN

## 2021-08-10 MED ORDER — LEUCOVORIN CALCIUM INJECTION 350 MG
400.0000 mg/m2 | Freq: Once | INTRAVENOUS | Status: AC
  Administered 2021-08-10: 13:00:00 696 mg via INTRAVENOUS
  Filled 2021-08-10: qty 34.8

## 2021-08-10 MED ORDER — SODIUM CHLORIDE 0.9 % IV SOLN
10.0000 mg | Freq: Once | INTRAVENOUS | Status: AC
  Administered 2021-08-10: 12:00:00 10 mg via INTRAVENOUS
  Filled 2021-08-10: qty 10

## 2021-08-10 MED ORDER — METHYLPREDNISOLONE SODIUM SUCC 125 MG IJ SOLR: 125.0000 mg | Freq: Once | INTRAMUSCULAR | Status: DC | PRN

## 2021-08-10 MED ORDER — HEPARIN SOD (PORK) LOCK FLUSH 100 UNIT/ML IV SOLN: 250.0000 [IU] | Freq: Once | INTRAVENOUS | Status: DC | PRN

## 2021-08-10 MED ORDER — ALTEPLASE 2 MG IJ SOLR: 2.0000 mg | Freq: Once | INTRAMUSCULAR | Status: DC | PRN

## 2021-08-10 MED ORDER — PALONOSETRON HCL INJECTION 0.25 MG/5ML
0.2500 mg | Freq: Once | INTRAVENOUS | Status: AC
  Administered 2021-08-10: 11:00:00 0.25 mg via INTRAVENOUS
  Filled 2021-08-10: qty 5

## 2021-08-10 MED ORDER — SODIUM CHLORIDE 0.9 % IV SOLN
5.0000 mg/kg | Freq: Once | INTRAVENOUS | Status: AC
  Administered 2021-08-10: 12:00:00 300 mg via INTRAVENOUS
  Filled 2021-08-10: qty 12

## 2021-08-10 MED ORDER — SODIUM CHLORIDE 0.9 % IV SOLN
300.0000 mg | Freq: Once | INTRAVENOUS | Status: DC
  Filled 2021-08-10: qty 15

## 2021-08-10 MED ORDER — FAMOTIDINE 20 MG IN NS 100 ML IVPB
20.0000 mg | Freq: Once | INTRAVENOUS | Status: AC
  Administered 2021-08-10: 11:00:00 20 mg via INTRAVENOUS
  Filled 2021-08-10: qty 100

## 2021-08-10 MED ORDER — SODIUM CHLORIDE 0.9 % IV SOLN: Freq: Once | INTRAVENOUS | Status: DC | PRN

## 2021-08-10 MED ORDER — SODIUM CHLORIDE 0.9 % IV SOLN
2400.0000 mg/m2 | INTRAVENOUS | Status: DC
  Administered 2021-08-10: 16:00:00 4200 mg via INTRAVENOUS
  Filled 2021-08-10: qty 84

## 2021-08-10 MED ORDER — DEXTROSE 5 % IV SOLN: Freq: Once | INTRAVENOUS | Status: AC

## 2021-08-10 MED ORDER — DIPHENHYDRAMINE HCL 50 MG/ML IJ SOLN: 50.0000 mg | Freq: Once | INTRAMUSCULAR | Status: DC | PRN

## 2021-08-10 MED ORDER — LORAZEPAM 1 MG PO TABS
0.5000 mg | ORAL_TABLET | Freq: Once | ORAL | Status: AC
  Administered 2021-08-10: 11:00:00 0.5 mg via ORAL
  Filled 2021-08-10: qty 1

## 2021-08-10 MED ORDER — SODIUM CHLORIDE 0.9 % IV SOLN: Freq: Once | INTRAVENOUS | Status: AC

## 2021-08-10 MED ORDER — SODIUM CHLORIDE 0.9% FLUSH
10.0000 mL | Freq: Once | INTRAVENOUS | Status: AC
  Administered 2021-08-10: 10:00:00 10 mL

## 2021-08-10 MED ORDER — FAMOTIDINE 20 MG IN NS 100 ML IVPB: 20.0000 mg | Freq: Once | INTRAVENOUS | Status: DC | PRN

## 2021-08-10 NOTE — Addendum Note (Signed)
Addended by: Tora Kindred on: 08/10/2021 10:12 AM   Modules accepted: Orders

## 2021-08-10 NOTE — Addendum Note (Signed)
Addended by: Tora Kindred on: 08/10/2021 10:16 AM   Modules accepted: Orders

## 2021-08-10 NOTE — Progress Notes (Signed)
Dr Burr Medico is at chairside to evaluate patient.  Per Dr Burr Medico, ok to treat today with [place] 1.4.

## 2021-08-10 NOTE — Addendum Note (Signed)
Addended by: Tora Kindred on: 08/10/2021 10:11 AM   Modules accepted: Orders

## 2021-08-10 NOTE — Addendum Note (Signed)
Addended by: Tora Kindred on: 08/10/2021 10:13 AM   Modules accepted: Orders

## 2021-08-10 NOTE — Progress Notes (Signed)
IV Iron is not due today.  Released in error by LPN in flush. Iron labs are pending; drawn today.  Kennith Center, Pharm.D., CPP 08/10/2021@10 :30 AM

## 2021-08-10 NOTE — Patient Instructions (Signed)
[facility] ONCOLOGY   Discharge Instructions: Thank you for choosing [facility] to provide your oncology and hematology care.   If you have a lab appointment with the [facility], please go directly to the [facility] and check in at the registration area.   Wear comfortable clothing and clothing appropriate for easy access to any Portacath or PICC line.   We strive to give you quality time with your provider. You may need to reschedule your appointment if you arrive late (15 or more minutes).  Arriving late affects you and other patients whose appointments are after yours.  Also, if you miss three or more appointments without notifying the office, you may be dismissed from the clinic at the providers discretion.      For prescription refill requests, have your pharmacy contact our office and allow 72 hours for refills to be completed.    Today you received the following chemotherapy and/or immunotherapy agents: bevacizumab-bvvr, oxaliplatin, leucovorin, and fluorouracil.      To help prevent nausea and vomiting after your treatment, we encourage you to take your nausea medication as directed.  BELOW ARE SYMPTOMS THAT SHOULD BE REPORTED IMMEDIATELY: *FEVER GREATER THAN 100.4 F (38 C) OR HIGHER *CHILLS OR SWEATING *NAUSEA AND VOMITING THAT IS NOT CONTROLLED WITH YOUR NAUSEA MEDICATION *UNUSUAL SHORTNESS OF BREATH *UNUSUAL BRUISING OR BLEEDING *URINARY PROBLEMS (pain or burning when urinating, or frequent urination) *BOWEL PROBLEMS (unusual diarrhea, constipation, pain near the anus) TENDERNESS IN MOUTH AND THROAT WITH OR WITHOUT PRESENCE OF ULCERS (sore throat, sores in mouth, or a toothache) UNUSUAL RASH, SWELLING OR PAIN  UNUSUAL VAGINAL DISCHARGE OR ITCHING   Items with * indicate a potential emergency and should be followed up as soon as possible or go to the Emergency Department if any problems should occur.  Please show the CHEMOTHERAPY  ALERT CARD or IMMUNOTHERAPY ALERT CARD at check-in to the Emergency Department and triage nurse.  Should you have questions after your visit or need to cancel or reschedule your appointment, please contact [facility]  Dept: [phone]  and follow the prompts.  Office hours are 8:00 a.m. to 4:30 p.m. Monday - Friday. Please note that voicemails left after 4:00 p.m. may not be returned until the following business day.  We are closed weekends and major holidays. You have access to a nurse at all times for urgent questions. Please call the main number to the clinic Dept: [phone] and follow the prompts.   For any non-urgent questions, you may also contact your provider using MyChart. We now offer e-Visits for anyone 3 and older to request care online for non-urgent symptoms. For details visit [email or web address].   Also download the MyChart app! Go to the app store, search "MyChart", open the app, select [facility], and log in with your MyChart username and password.  Due to Covid, a mask is required upon entering the hospital/clinic. If you do not have a mask, one will be given to you upon arrival. For doctor visits, patients may have 1 support person aged 24 or older with them. For treatment visits, patients cannot have anyone with them due to current Covid guidelines and our immunocompromised population.

## 2021-08-10 NOTE — Progress Notes (Signed)
[place]   [phone] [phone]   Clinic Follow up Note   Patient Care Team: Pcp, No as PCP - General [name], MD as Consulting Physician (Oncology) [name], RN as Nurse Navigator (Oncology)  Date of Service:  08/10/2021  CHIEF COMPLAINT: f/u of metastatic colon cancer  CURRENT THERAPY:  First line FOLFOX q2 weeks, starting 03/16/21 -Bevacizumab added with C2 (03/29/21)  ASSESSMENT & PLAN:  Kari Patel is a 70 y.o. female with   1. 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 Work up with EGD and colonoscopy on 12/07/20 revealed a large cecal mass. Biopsy confirmed adenocarcinoma.  -She underwent repeat CT CAP on 03/10/21 showing: 7.1 cm cecal mass; similar extensive peritoneal/omental nodularity; similar prominent and mildly enlarged retroperitoneal and mesenteric lymph nodes; numerous scattered bilateral pulmonary nodules. -Omental biopsy 03/13/21 confirmed metastatic adenocarcinoma, consistent with colon primary.   -she began first line FOLFOX 03/16/21. She has tolerated well with mild diarrhea -FO showed MSI stable disease, low mutation burden 4 muts/Mb, K-ras positive, bevacizumab was added with C2 (03/29/21) -CT CAP 06/05/21 showed good response, CEA trending down. We will continue current regimen. -she had one instance of reaction on 06/07/21, no recurrent issues. -she recovered well with the extra break for the holidays. Labs reviewed, overall stable to improved except WBC is 2.6. We will continue Udenyca on day 3.   2. 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 This has improved, especially with treatment break over the holidays. -she reports she was able to eat well during Christmas. -she has developed some mild numbness to her feet. Will monitor.   3. Anemia, iron deficiency   -likely related to her colon cancer and iron deficiency -She has received IV iron Venofer 200 mg, most recently 04/03/21. Hgb improving.   4. Goal of care discussion  -She understands the incurable nature of her stage IV cancer diagnosis  -Treatment goal is palliative, to control her disease, improve her symptoms, and prolong her life -she is full code now    PLAN: -proceed with C10 FOLFOX and Beva today at same dose   -Udenyca on day 3 -lab, flush, f/u, and C11 on 08/23/21 as scheduled   No problem-specific Assessment & Plan notes found for this encounter.   SUMMARY OF ONCOLOGIC HISTORY: Oncology History Overview Note  Cancer Staging Cancer of right colon [facility]) Staging form: Colon and Rectum - Neuroendocine Tumors, AJCC 8th Edition - Clinical stage from 12/13/2020: Stage IV (cTX, cN1, cM1) - Signed by [name], MD on 03/01/2021    Cancer of right colon [facility])  12/07/2020 Procedure   Colonoscopy  Impression: - non-bleeding internal hemorrhoids - diverticulosis in entire examined colon - one 9 mm polyp in ascending colon removed with hot snare - one 13 mm polyp in transverse colon removed with hot fnare - likely malignant partially-obstructing tumor in cecum.   12/07/2020 Pathology Results   FINAL PATHOLOGIC DIAGNOSIS  SMALL BOWEL, BIOPSIES - duodenal mucosa within normal limits GASTRIC BODY AND ATRIUM, BIOPSY - gastric antral and body-type mucosa with moderate chronic Helicobacter pylori gastritis ASCENDING COLON, POLYPECTOMY - tubular adenoma with prominent eosinophilic infiltrate CECUM, "MASS," BIOPSIES - moderately differentiated adenocarcinoma TRANSVERSE COLON, POLYPECTOMY - high grade dysplasia involving tubulovillous adenoma - cauterized margins negative   12/13/2020 Cancer Staging   Staging form: Colon and Rectum - Neuroendocine Tumors, AJCC 8th Edition -  Clinical stage from 12/13/2020: Stage IV (cTX, cN1, cM1) - Signed by [name], MD on 03/01/2021    12/22/2020  Imaging   CT CAP  IMPRESSION: Large 7.5 cm cecal mass with surrounding nodularity/lymphadenopathy 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 The largest measures 6 mm in the RUL. Findings are nonspecific but can be followed per Fleischner criteria Hiatal hernia   03/01/2021 Initial Diagnosis   Cancer of right colon ([place])   03/10/2021 Imaging   CT CAP  IMPRESSION: 1. Large infiltrative cecal mass measuring approximately 7.1 cm, reflecting the known right-sided colonic neoplasm. 2. Similar extensive peritoneal/omental nodularity throughout the abdomen and pelvis with trace free fluid in the pelvis along the pericolic gutters, consistent with disease involvement. 3. Similar prominent and mildly enlarged retroperitoneal and mesenteric lymph nodes, most likely reflecting disease involvement. 4. Numerous scattered bilateral pulmonary nodules measuring up to 6 mm are similar to prior and nonspecific but suspicious for disease involvement. Attention on follow-up imaging suggested. 5. Similar mild right-sided hydronephrosis without hydroureter, with the distal ureter traversing peritoneal nodularity. Likely reflecting partial UPJ obstruction given lack of hydroureter. Attention on follow-up imaging suggested. Cholelithiasis without findings of acute cholecystitis. 6.  Aortic Atherosclerosis (ICD10-I70.0).   03/13/2021 Pathology Results   FINAL MICROSCOPIC DIAGNOSIS:   A. OMENTUM, NEEDLE CORE BIOPSY:  - Metastatic adenocarcinoma, consistent with clinical impression of a  colonic primary.  See comment    03/13/2021 Miscellaneous      03/16/2021 -  Chemotherapy   Patient is on Treatment Plan : COLORECTAL  FOLFOX + Bevacizumab q14d     06/05/2021 Imaging   CT CAP  IMPRESSION: Chest Impression:   1. Bilateral small pulmonary nodules appear unchanged. 2. No new nodularity. 3. No metastatic adenopathy.   Abdomen / Pelvis Impression:   1. Marked decrease in volume of circumferential mass surrounding the distal ileum leading up the terminal ileum. 2. Marked decrease in size of peritoneal implants along the ventral peritoneal surface of the lower abdomen. Persistent nodularity again much improved. 3. No evidence of liver metastasis.      INTERVAL HISTORY:  Kari Patel is here for a follow up of metastatic colon cancer. She was last seen by NP Lacie on 07/12/21. She was seen in the infusion area. She notes she had a good Christmas with her kids. She reports she is feeling well overall since her last chemo; she adds the break was helpful. She reports some numbness to her feet, which does not affect her function.   All other systems were reviewed with the patient and are negative.  MEDICAL HISTORY:  Past Medical History:  Diagnosis Date   Anemia    Colon cancer ([place])    cecum   Heart murmur    Hypertension     SURGICAL HISTORY: Past Surgical History:  Procedure Laterality Date   ABDOMINAL HYSTERECTOMY     IR IMAGING GUIDED PORT INSERTION  03/13/2021    I have reviewed the social history and family history with the patient and they are unchanged from previous note.  ALLERGIES:  is allergic to demerol [meperidine hcl].  MEDICATIONS:  Current Outpatient Medications  Medication Sig Dispense Refill   ondansetron (ZOFRAN) 8 MG tablet Take 1 tablet (8 mg total) by mouth every 8 (eight) hours as needed for nausea or vomiting. [place]  tablet 1   prochlorperazine (COMPAZINE) 10 MG tablet Take 1 tablet (10 mg total) by mouth every 6 (six) hours as needed for nausea or vomiting. 30 tablet 1   amLODipine (NORVASC) 10 MG tablet Take 10 mg by mouth daily.     lidocaine-prilocaine (EMLA) cream  Apply 1 application topically as needed. 30 g 4   lisinopril (ZESTRIL) 5 MG tablet Take 5 mg by mouth daily.     LORazepam (ATIVAN) 0.5 MG tablet Take 1 tablet (0.5 mg total) by mouth every 8 (eight) hours. 30 tablet 0   pantoprazole (PROTONIX) 40 MG tablet TAKE 1 TABLET BY MOUTH EVERY DAY 90 tablet 1   potassium chloride SA (KLOR-CON) 20 MEQ tablet Take 1 tablet (20 mEq total) by mouth 2 (two) times daily. 20 tablet 0   sucralfate (CARAFATE) 1 GM/10ML suspension Take 10 mLs (1 g total) by mouth 4 (four) times daily -  with meals and at bedtime. 420 mL 0   No current facility-administered medications for this visit.   Facility-Administered Medications Ordered in Other Visits  Medication Dose Route Frequency Provider Last Rate Last Admin   bevacizumab-bvzr (ZIRABEV) 300 mg in sodium chloride 0.9 % 100 mL chemo infusion  5 mg/kg (Order-Specific) Intravenous Once [name], MD       dextrose 5 % solution   Intravenous Once [name], MD       fluorouracil (ADRUCIL) 4,200 mg in sodium chloride 0.9 % 66 mL chemo infusion  2,400 mg/m2 (Treatment Plan Recorded) Intravenous 1 day or 1 dose [name], MD       leucovorin 696 mg in dextrose 5 % 250 mL infusion  400 mg/m2 (Treatment Plan Recorded) Intravenous Once [name], MD       oxaliplatin (ELOXATIN) 120 mg in dextrose 5 % 500 mL chemo infusion  70 mg/m2 (Treatment Plan Recorded) Intravenous Once [name], MD        PHYSICAL EXAMINATION: ECOG PERFORMANCE STATUS: 1 - Symptomatic but completely ambulatory  There were no vitals filed for this visit. Wt Readings from Last 3 Encounters:  08/10/21 131 lb (59.4 kg)  07/12/21 128 lb 12.8 oz (58.4 kg)  06/22/21 132 lb 4.8 oz (60 kg)     GENERAL:alert, no distress and comfortable SKIN: skin color normal, no rashes or significant lesions EYES: normal, Conjunctiva are pink and non-injected, sclera clear  NEURO: alert & oriented x 3 with fluent speech  LABORATORY DATA:  I have reviewed the data as  listed CBC Latest Ref Rng & Units 08/10/2021 07/12/2021 06/22/2021  WBC 4.0 - 10.5 K/uL 2.6(L) 4.2 6.0  Hemoglobin 12.0 - 15.0 g/dL 10.1(L) 9.4(L) 9.3(L)  Hematocrit 36.0 - 46.0 % 31.7(L) 29.0(L) 28.9(L)  Platelets 150 - 400 K/uL 146(L) 210 187     CMP Latest Ref Rng & Units 08/10/2021 07/12/2021 06/22/2021  Glucose 70 - 99 mg/dL 113(H) 102(H) 98  BUN 8 - 23 mg/dL 7(L) 6(L) 6(L)  Creatinine 0.44 - 1.00 mg/dL 0.56 0.55 0.58  Sodium 135 - 145 mmol/L 140 137 140  Potassium 3.5 - 5.1 mmol/L 4.1 4.0 3.4(L)  Chloride 98 - 111 mmol/L 105 106 104  CO2 22 - 32 mmol/L _0 Calcium 8.9 - 10.3 mg/dL 9.4 9.1 8.9  Total Protein 6.5 - 8.1 g/dL 7.3 7.0 7.2  Total Bilirubin 0.3 - 1.2 mg/dL 0.3 0.4 0.2(L)  Alkaline Phos 38 - 126 U/L 74 84 111  AST 15 - 41 U/L 11(L) 18 12(L)  ALT  0 - 44 U/L _0 RADIOGRAPHIC STUDIES: I have personally reviewed the radiological images as listed and agreed with the findings in the report. No results found.    No orders of the defined types were placed in this encounter.  All questions were answered. The patient knows to call the clinic with any problems, questions or concerns. No barriers to learning was detected. The total time spent in the appointment was 30 minutes.     [name], MD 08/10/2021   I, Wilburn Mylar, am acting as scribe for [name], MD.   I have reviewed the above documentation for accuracy and completeness, and I agree with the above.

## 2021-08-12 ENCOUNTER — Other Ambulatory Visit: Payer: Self-pay

## 2021-08-12 ENCOUNTER — Inpatient Hospital Stay: Payer: Medicare HMO

## 2021-08-12 VITALS — BP 138/67 | HR 71 | Temp 98.9°F | Resp 18

## 2021-08-12 DIAGNOSIS — C182 Malignant neoplasm of ascending colon: Secondary | ICD-10-CM

## 2021-08-12 DIAGNOSIS — Z5112 Encounter for antineoplastic immunotherapy: Secondary | ICD-10-CM | POA: Diagnosis not present

## 2021-08-12 MED ORDER — PEGFILGRASTIM-CBQV 6 MG/0.6ML ~~LOC~~ SOSY
6.0000 mg | PREFILLED_SYRINGE | Freq: Once | SUBCUTANEOUS | Status: AC
  Administered 2021-08-12: 6 mg via SUBCUTANEOUS
  Filled 2021-08-12: qty 0.6

## 2021-08-12 MED ORDER — SODIUM CHLORIDE 0.9% FLUSH
10.0000 mL | INTRAVENOUS | Status: DC | PRN
  Administered 2021-08-12: 10 mL

## 2021-08-12 MED ORDER — HEPARIN SOD (PORK) LOCK FLUSH 100 UNIT/ML IV SOLN
500.0000 [IU] | Freq: Once | INTRAVENOUS | Status: AC | PRN
  Administered 2021-08-12: 500 [IU]

## 2021-08-12 NOTE — Patient Instructions (Signed)

## 2021-08-15 ENCOUNTER — Telehealth: Payer: Self-pay

## 2021-08-15 NOTE — Telephone Encounter (Signed)
Spoke with Patient regarding test results as requested by Provider. Patient concerned about constipation. Advised Patient to take Senokot tablets, Colace, and Miralax as directed until she has a bowel movement, and then to stop Miralax and continue Colace and Senokot as directed. Instructed to increase water intake to stay hydrated.No other concerns voiced at this time.

## 2021-08-15 NOTE — Telephone Encounter (Signed)
-----   Message from Alla Feeling, NP sent at 08/12/2021 12:50 PM EST ----- Please let her know recent ferritin is normal, she responded well to IV iron. She can continue oral iron if she is taking it, but if not, she does not need to restart now.  Thanks, Regan Rakers, NP

## 2021-08-16 ENCOUNTER — Ambulatory Visit: Payer: Medicare HMO

## 2021-08-16 ENCOUNTER — Other Ambulatory Visit: Payer: Medicare HMO

## 2021-08-16 ENCOUNTER — Ambulatory Visit: Payer: Medicare HMO | Admitting: Hematology

## 2021-08-21 NOTE — Progress Notes (Signed)
[facility] [facility]   Telephone:([phone]) [phone] Fax:(336) [phone]   Clinic Follow up Note   Patient Care Team: Pcp, No as PCP - General [name], MD as Consulting Physician (Oncology) [name], RN as Nurse Navigator (Oncology) 08/23/2021  CHIEF COMPLAINT: Follow up metastatic colon cancer   SUMMARY OF ONCOLOGIC HISTORY: Oncology History Overview Note  Cancer Staging Cancer of right colon [facility]) Staging form: Colon and Rectum - Neuroendocine Tumors, AJCC 8th Edition - Clinical stage from 12/13/2020: Stage IV (cTX, cN1, cM1) - Signed by [name], MD on 03/01/2021    Cancer of right colon ([place])  12/07/2020 Procedure   Colonoscopy  Impression: - non-bleeding internal hemorrhoids - diverticulosis in entire examined colon - one 9 mm polyp in ascending colon removed with hot snare - one 13 mm polyp in transverse colon removed with hot fnare - likely malignant partially-obstructing tumor in cecum.   12/07/2020 Pathology Results   FINAL PATHOLOGIC DIAGNOSIS  SMALL BOWEL, BIOPSIES - duodenal mucosa within normal limits GASTRIC BODY AND ATRIUM, BIOPSY - gastric antral and body-type mucosa with moderate chronic Helicobacter pylori gastritis ASCENDING COLON, POLYPECTOMY - tubular adenoma with prominent eosinophilic infiltrate CECUM, "MASS," BIOPSIES - moderately differentiated adenocarcinoma TRANSVERSE COLON, POLYPECTOMY - high grade dysplasia involving tubulovillous adenoma - cauterized margins negative   12/13/2020 Cancer Staging   Staging form: Colon and Rectum - Neuroendocine Tumors, AJCC 8th Edition - Clinical stage from 12/13/2020: Stage IV (cTX, cN1, cM1) - Signed by [name], MD on 03/01/2021    12/22/2020 Imaging   CT CAP  IMPRESSION: Large 7.5 cm cecal mass with surrounding nodularity/lymphadenopathy 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 The largest measures 6 mm in the RUL. Findings are nonspecific but can be followed per Fleischner criteria Hiatal hernia   03/01/2021 Initial Diagnosis   Cancer of right colon ([place])   03/10/2021 Imaging   CT CAP  IMPRESSION: 1. Large infiltrative cecal mass measuring approximately 7.1 cm, reflecting the known right-sided colonic neoplasm. 2. Similar extensive peritoneal/omental nodularity throughout the abdomen and pelvis with trace free fluid in the pelvis along the pericolic gutters, consistent with disease involvement. 3. Similar prominent and mildly enlarged retroperitoneal and mesenteric lymph nodes, most likely reflecting disease involvement. 4. Numerous scattered bilateral pulmonary nodules measuring up to 6 mm are similar to prior and nonspecific but suspicious for disease involvement. Attention on follow-up imaging suggested. 5. Similar mild right-sided hydronephrosis without hydroureter, with the distal ureter traversing peritoneal nodularity. Likely reflecting partial UPJ obstruction given lack of hydroureter. Attention on follow-up imaging suggested. Cholelithiasis without findings of acute cholecystitis. 6.  Aortic Atherosclerosis (ICD10-I70.0).   03/13/2021 Pathology Results   FINAL MICROSCOPIC DIAGNOSIS:   A. OMENTUM, NEEDLE CORE BIOPSY:  - Metastatic adenocarcinoma, consistent with clinical impression of a  colonic primary.  See comment    03/13/2021 Miscellaneous      03/16/2021 -  Chemotherapy   Patient is on Treatment Plan : COLORECTAL FOLFOX + Bevacizumab q14d     06/05/2021 Imaging   CT CAP  IMPRESSION: Chest Impression:   1. Bilateral small pulmonary nodules appear unchanged. 2. No new nodularity. 3. No metastatic adenopathy.   Abdomen / Pelvis Impression:   1. Marked decrease in  volume of circumferential mass surrounding the distal ileum leading up the terminal ileum. 2. Marked  decrease in size of peritoneal implants along the ventral peritoneal surface of the lower abdomen. Persistent nodularity again much improved. 3. No evidence of liver metastasis.     CURRENT THERAPY: Firs tline FOLFOX  q2 weeks, starting 03/16/21; bevacizumab added with C2 03/29/21  INTERVAL HISTORY: Ms. Rini returns for follow up and treatment as scheduled. Last seen 08/10/21 and completed cycle 10 of FOLFOX/beva.  She is doing well except worsening constipation, last BM 1 day ago but is hard to come out and does not feel complete.  She has tried MiraLAX, stool softener, Senokot, and Metamucil without much efficacy.  This makes her stomach ache and feel "tight and uncomfortable."  She denies other abdominal pain except this from constipation.  Denies nausea/vomiting, able to eat and drink.  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  Denies balance issue or fall.  She can function well.  She eventually recovered from last cycle by day 12.  Otherwise, denies fever, chills, cough, chest pain, dyspnea, leg edema, mucositis, rash, or any other new complaints.   MEDICAL HISTORY:  Past Medical History:  Diagnosis Date   Anemia    Colon cancer ([name])    cecum   Heart murmur    Hypertension     SURGICAL HISTORY: Past Surgical History:  Procedure Laterality Date   ABDOMINAL HYSTERECTOMY     IR IMAGING GUIDED PORT INSERTION  03/13/2021    I have reviewed the social history and family history with the patient and they are unchanged from previous note.  ALLERGIES:  is allergic to demerol [meperidine hcl].  MEDICATIONS:  Current Outpatient Medications  Medication Sig Dispense Refill   ondansetron (ZOFRAN) 8 MG tablet Take 1 tablet (8 mg total) by mouth every 8 (eight) hours as needed for  nausea or vomiting. 20 tablet 1   prochlorperazine (COMPAZINE) 10 MG tablet Take 1 tablet (10 mg total) by mouth every 6 (six) hours as needed for nausea or vomiting. 30 tablet 1   amLODipine (NORVASC) 10 MG tablet Take 10 mg by mouth daily.     lidocaine-prilocaine (EMLA) cream Apply 1 application topically as needed. 30 g 4   lisinopril (ZESTRIL) 5 MG tablet Take 5 mg by mouth daily.     LORazepam (ATIVAN) 0.5 MG tablet Take 1 tablet (0.5 mg total) by mouth every 8 (eight) hours. 30 tablet 0   pantoprazole (PROTONIX) 40 MG tablet TAKE 1 TABLET BY MOUTH EVERY DAY 90 tablet 1   potassium chloride SA (KLOR-CON) 20 MEQ tablet Take 1 tablet (20 mEq total) by mouth 2 (two) times daily. 20 tablet 0   sucralfate (CARAFATE) 1 GM/10ML suspension Take 10 mLs (1 g total) by mouth 4 (four) times daily -  with meals and at bedtime. 420 mL 0   No current facility-administered medications for this visit.   Facility-Administered Medications Ordered in Other Visits  Medication Dose Route Frequency Provider Last Rate Last Admin   fluorouracil (ADRUCIL) 4,200 mg in sodium chloride 0.9 % 66 mL chemo infusion  2,400 mg/m2 (Treatment Plan Recorded) Intravenous 1 day or 1 dose [name], MD       leucovorin 696 mg in dextrose 5 % 250 mL infusion  400 mg/m2 (Treatment Plan Recorded) Intravenous Once [name], MD 95 mL/hr at 08/23/21 1129 696 mg at 08/23/21 1129   oxaliplatin (ELOXATIN) 85 mg in dextrose 5 %  500 mL chemo infusion  50 mg/m2 (Treatment Plan Recorded) Intravenous Once [name], MD 172 mL/hr at 08/23/21 1126 85 mg at 08/23/21 1126   sodium chloride flush (NS) 0.9 % injection 10 mL  10 mL Intracatheter PRN [name], MD        PHYSICAL EXAMINATION: ECOG PERFORMANCE STATUS: 1 - Symptomatic but completely ambulatory  Vitals:   08/23/21 0916  BP: (!) 154/65  Pulse: 64  Resp: 17  Temp: 98 F (36.7 C)  SpO2: 99%   Filed Weights   08/23/21 0916  Weight: 132 lb 4 oz (60 kg)    GENERAL:alert, no  distress and comfortable SKIN: No rash EYES: sclera clear LUNGS: clear with normal breathing effort HEART: regular rate & rhythm, no lower extremity edema ABDOMEN:abdomen soft, non-tender and normal bowel sounds NEURO: alert & oriented x 3 with fluent speech, no focal motor deficits.  Moderately decreased vibratory sense over the toes and feet per tuning fork exam. PAC without erythema  LABORATORY DATA:  I have reviewed the data as listed CBC Latest Ref Rng & Units 08/23/2021 08/10/2021 07/12/2021  WBC 4.0 - 10.5 K/uL 4.0 2.6(L) 4.2  Hemoglobin 12.0 - 15.0 g/dL 10.1(L) 10.1(L) 9.4(L)  Hematocrit 36.0 - 46.0 % 30.4(L) 31.7(L) 29.0(L)  Platelets 150 - 400 K/uL 133(L) 146(L) 210     CMP Latest Ref Rng & Units 08/23/2021 08/10/2021 07/12/2021  Glucose 70 - 99 mg/dL 124(H) 113(H) 102(H)  BUN 8 - 23 mg/dL 6(L) 7(L) 6(L)  Creatinine 0.44 - 1.00 mg/dL 0.50 0.56 0.55  Sodium 135 - 145 mmol/L 139 140 137  Potassium 3.5 - 5.1 mmol/L 3.6 4.1 4.0  Chloride 98 - 111 mmol/L 104 105 106  CO2 22 - 32 mmol/L _0 Calcium 8.9 - 10.3 mg/dL 9.2 9.4 9.1  Total Protein 6.5 - 8.1 g/dL 7.3 7.3 7.0  Total Bilirubin 0.3 - 1.2 mg/dL 0.4 0.3 0.4  Alkaline Phos 38 - 126 U/L 100 74 84  AST 15 - 41 U/L 9(L) 11(L) 18  ALT 0 - 44 U/L <_1 RADIOGRAPHIC STUDIES: I have personally reviewed the radiological images as listed and agreed with the findings in the report. No results found.   ASSESSMENT & PLAN: Kari Patel is a 71 y.o. female with    1. 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 Work up with EGD and colonoscopy on 12/07/20 revealed a large cecal mass. Biopsy confirmed adenocarcinoma.  -She underwent repeat CT CAP on 03/10/21 showing: 7.1 cm cecal mass; similar extensive peritoneal/omental nodularity; similar prominent and mildly enlarged retroperitoneal and mesenteric lymph nodes; numerous scattered  bilateral pulmonary nodules; similar mild right-sided hydronephrosis. -Omental biopsy 03/13/21 confirmed metastatic adenocarcinoma, consistent with colon primary.   -port placed on 03/13/21, began first line FOLFOX 03/16/21, tolerated well with mild diarrhea -FO showed MSI stable disease, low mutation burden 4 muts/Mb, K-ras positive, bevacizumab was added with C2 (03/29/21) -CT CAP 06/05/2021 showed good response, CEA trending down. continue current regimen -Tolerating FOLFOX/Beva moderately well with neuropathy, constipation, and prolonged recovery.  We will decrease oxaliplatin to 50 mg/m2 today -Anticipate restaging in 3-4 weeks   2. Cold sensitivity rxn 06/07/21 -after completing oxali she stood up and was lightheaded and could not breath -tachy, other VS stable -seen by [facility], supported with warm blanket. Sx resolved -likely related to oxali but not allergic reaction -she was given  low dose ativan with treatment on 06/22/21 and had no recurrent issues. Will continue ativan with treatment   3. Anemia, iron deficiency  -Likely secondary to #1 -She received IV iron Venofer 200 mg in 03/2021 -She has constipation from iron infusion, we reviewed symptom management -Anemia is stable   4.  Goal of care discussion  -She understands the incurable nature of her stage IV cancer diagnosis  -Treatment goal is palliative, to control her disease, improve her symptoms, and prolong her life -she is full code now   Disposition: Ms. Cuervo appears stable.  She completed 10 cycles of FOLFOX and bevacizumab.  She tolerates treatment well with neuropathy and constipation.  Side effects are partially managed with supportive care at home.  She is able to recover and function well after 1.5 weeks.  There is no clinical evidence of disease progression.   For neuropathy, she has decreased vibratory sense over the feet per tuning fork exam.  She has no pain.  I recommend to add a B complex vitamin and dose reduce  oxaliplatin  to 50 mg/m2 from today.  He understands the side effect may be chronic and potentially irreversible and we may need to discontinue oxali in the near future.  For constipation, likely secondary to oxaliplatin, I recommend magnesium citrate bottle half in a.m., then complete bottle in p.m. if no BM.  She can repeat this 2-3 times per week.   Labs reviewed, adequate to proceed with bevacizumab and cycle 11 FOLFOX today as planned, with dose reduction, then GCSF and pump d/c on day 3.  I am referring her for restaging CT CAP in 3-4 weeks.  Follow-up in 2 weeks with next cycle.  Plan reviewed with [name]   Orders Placed This Encounter  Procedures   CT CHEST ABDOMEN PELVIS W CONTRAST    Standing Status:   Future    Standing Expiration Date:   08/23/2022    Order Specific Question:   Preferred imaging location?    Answer:   [facility]    Order Specific Question:   Is Oral Contrast requested for this exam?    Answer:   Yes, Per Radiology protocol   All questions were answered. The patient knows to call the clinic with any problems, questions or concerns. No barriers to learning was detected. I spent 20 minutes counseling the patient face to face. The total time spent in the appointment was 30 minutes and more than 50% was on counseling and review of test results     Alla Feeling, NP 08/23/21

## 2021-08-22 MED FILL — Dexamethasone Sodium Phosphate Inj 100 MG/10ML: INTRAMUSCULAR | Qty: 1 | Status: AC

## 2021-08-23 ENCOUNTER — Inpatient Hospital Stay: Payer: Medicare HMO

## 2021-08-23 ENCOUNTER — Inpatient Hospital Stay: Payer: Medicare HMO | Admitting: Nurse Practitioner

## 2021-08-23 ENCOUNTER — Encounter: Payer: Self-pay | Admitting: Nurse Practitioner

## 2021-08-23 ENCOUNTER — Other Ambulatory Visit: Payer: Self-pay

## 2021-08-23 ENCOUNTER — Inpatient Hospital Stay: Payer: Medicare HMO | Attending: Hematology

## 2021-08-23 VITALS — BP 154/65 | HR 64 | Temp 98.0°F | Resp 17 | Wt 132.2 lb

## 2021-08-23 DIAGNOSIS — D509 Iron deficiency anemia, unspecified: Secondary | ICD-10-CM | POA: Insufficient documentation

## 2021-08-23 DIAGNOSIS — C786 Secondary malignant neoplasm of retroperitoneum and peritoneum: Secondary | ICD-10-CM | POA: Insufficient documentation

## 2021-08-23 DIAGNOSIS — C182 Malignant neoplasm of ascending colon: Secondary | ICD-10-CM

## 2021-08-23 DIAGNOSIS — D500 Iron deficiency anemia secondary to blood loss (chronic): Secondary | ICD-10-CM

## 2021-08-23 DIAGNOSIS — C180 Malignant neoplasm of cecum: Secondary | ICD-10-CM | POA: Diagnosis present

## 2021-08-23 DIAGNOSIS — Z5189 Encounter for other specified aftercare: Secondary | ICD-10-CM | POA: Diagnosis not present

## 2021-08-23 DIAGNOSIS — Z5112 Encounter for antineoplastic immunotherapy: Secondary | ICD-10-CM | POA: Insufficient documentation

## 2021-08-23 DIAGNOSIS — Z5111 Encounter for antineoplastic chemotherapy: Secondary | ICD-10-CM | POA: Insufficient documentation

## 2021-08-23 DIAGNOSIS — G629 Polyneuropathy, unspecified: Secondary | ICD-10-CM | POA: Insufficient documentation

## 2021-08-23 DIAGNOSIS — Z452 Encounter for adjustment and management of vascular access device: Secondary | ICD-10-CM | POA: Diagnosis not present

## 2021-08-23 DIAGNOSIS — K5900 Constipation, unspecified: Secondary | ICD-10-CM | POA: Diagnosis not present

## 2021-08-23 DIAGNOSIS — Z95828 Presence of other vascular implants and grafts: Secondary | ICD-10-CM

## 2021-08-23 LAB — CMP (CANCER CENTER ONLY)
ALT: <5 U/L (ref 0–44)
AST: 9 U/L — ABNORMAL LOW (ref 15–41)
Albumin: 3.7 g/dL (ref 3.5–5.0)
Alkaline Phosphatase: 100 U/L (ref 38–126)
Anion gap: 8 (ref 5–15)
BUN: 6 mg/dL — ABNORMAL LOW (ref 8–23)
CO2: 27 mmol/L (ref 22–32)
Calcium: 9.2 mg/dL (ref 8.9–10.3)
Chloride: 104 mmol/L (ref 98–111)
Creatinine: 0.5 mg/dL (ref 0.44–1.00)
GFR, Estimated: >60 mL/min (ref >60)
Glucose, Bld: 124 mg/dL — ABNORMAL HIGH (ref 70–99)
Potassium: 3.6 mmol/L (ref 3.5–5.1)
Sodium: 139 mmol/L (ref 135–145)
Total Bilirubin: 0.4 mg/dL (ref 0.3–1.2)
Total Protein: 7.3 g/dL (ref 6.5–8.1)

## 2021-08-23 LAB — CBC WITH DIFFERENTIAL (CANCER CENTER ONLY)
Abs Immature Granulocytes: 0.05 10*3/uL (ref 0.00–0.07)
Basophils Absolute: 0 10*3/uL (ref 0.0–0.1)
Basophils Relative: 1 %
Eosinophils Absolute: 0 10*3/uL (ref 0.0–0.5)
Eosinophils Relative: 1 %
HCT: 30.4 % — ABNORMAL LOW (ref 36.0–46.0)
Hemoglobin: 10.1 g/dL — ABNORMAL LOW (ref 12.0–15.0)
Immature Granulocytes: 1 %
Lymphocytes Relative: 18 %
Lymphs Abs: 0.7 10*3/uL (ref 0.7–4.0)
MCH: 32.4 pg (ref 26.0–34.0)
MCHC: 33.2 g/dL (ref 30.0–36.0)
MCV: 97.4 fL (ref 80.0–100.0)
Monocytes Absolute: 0.4 10*3/uL (ref 0.1–1.0)
Monocytes Relative: 9 %
Neutro Abs: 2.8 10*3/uL (ref 1.7–7.7)
Neutrophils Relative %: 70 %
Platelet Count: 133 10*3/uL — ABNORMAL LOW (ref 150–400)
RBC: 3.12 MIL/uL — ABNORMAL LOW (ref 3.87–5.11)
RDW: 14.8 % (ref 11.5–15.5)
WBC Count: 4 10*3/uL (ref 4.0–10.5)
nRBC: 0 % (ref 0.0–0.2)

## 2021-08-23 LAB — IRON AND IRON BINDING CAPACITY (CC-WL,HP ONLY)
Iron: 46 ug/dL (ref 28–170)
Saturation Ratios: 17 % (ref 10.4–31.8)
TIBC: 265 ug/dL (ref 250–450)
UIBC: 219 ug/dL (ref 148–442)

## 2021-08-23 LAB — CEA (IN HOUSE-CHCC): CEA (CHCC-In House): 4.58 ng/mL (ref 0.00–5.00)

## 2021-08-23 LAB — TOTAL PROTEIN, URINE DIPSTICK

## 2021-08-23 MED ORDER — OXALIPLATIN CHEMO INJECTION 100 MG/20ML
50.0000 mg/m2 | Freq: Once | INTRAVENOUS | Status: AC
  Administered 2021-08-23: 85 mg via INTRAVENOUS
  Filled 2021-08-23: qty 17

## 2021-08-23 MED ORDER — SODIUM CHLORIDE 0.9% FLUSH: 10.0000 mL | INTRAVENOUS | Status: DC | PRN

## 2021-08-23 MED ORDER — DEXTROSE 5 % IV SOLN: Freq: Once | INTRAVENOUS | Status: AC

## 2021-08-23 MED ORDER — SODIUM CHLORIDE 0.9 % IV SOLN
5.0000 mg/kg | Freq: Once | INTRAVENOUS | Status: AC
  Administered 2021-08-23: 300 mg via INTRAVENOUS
  Filled 2021-08-23: qty 12

## 2021-08-23 MED ORDER — FAMOTIDINE 20 MG IN NS 100 ML IVPB
20.0000 mg | Freq: Once | INTRAVENOUS | Status: AC
  Administered 2021-08-23: 20 mg via INTRAVENOUS
  Filled 2021-08-23: qty 100

## 2021-08-23 MED ORDER — PALONOSETRON HCL INJECTION 0.25 MG/5ML
0.2500 mg | Freq: Once | INTRAVENOUS | Status: AC
  Administered 2021-08-23: 0.25 mg via INTRAVENOUS
  Filled 2021-08-23: qty 5

## 2021-08-23 MED ORDER — LEUCOVORIN CALCIUM INJECTION 350 MG
400.0000 mg/m2 | Freq: Once | INTRAVENOUS | Status: AC
  Administered 2021-08-23: 696 mg via INTRAVENOUS
  Filled 2021-08-23: qty 34.8

## 2021-08-23 MED ORDER — SODIUM CHLORIDE 0.9 % IV SOLN: Freq: Once | INTRAVENOUS | Status: AC

## 2021-08-23 MED ORDER — SODIUM CHLORIDE 0.9 % IV SOLN
10.0000 mg | Freq: Once | INTRAVENOUS | Status: AC
  Administered 2021-08-23: 10 mg via INTRAVENOUS
  Filled 2021-08-23: qty 10

## 2021-08-23 MED ORDER — LORAZEPAM 1 MG PO TABS
0.5000 mg | ORAL_TABLET | Freq: Once | ORAL | Status: AC
  Administered 2021-08-23: 0.5 mg via ORAL
  Filled 2021-08-23: qty 1

## 2021-08-23 MED ORDER — SODIUM CHLORIDE 0.9% FLUSH
10.0000 mL | Freq: Once | INTRAVENOUS | Status: AC
  Administered 2021-08-23: 10 mL

## 2021-08-23 MED ORDER — SODIUM CHLORIDE 0.9 % IV SOLN
2400.0000 mg/m2 | INTRAVENOUS | Status: DC
  Administered 2021-08-23: 4200 mg via INTRAVENOUS
  Filled 2021-08-23: qty 84

## 2021-08-23 NOTE — Patient Instructions (Signed)
[facility]COL[facility]   Discharge Instructions: Thank you for choosing [facility] to provide your oncology and hematology care.   If you have a lab appointment with the [facility], please go directly to the [facility] and check in at the registration area.   Wear comfortable clothing and clothing appropriate for easy access to any Portacath or PICC line.   We strive to give you quality time with your provider. You may need to reschedule your appointment if you arrive late (15 or more minutes).  Arriving late affects you and other patients whose appointments are after yours.  Also, if you miss three or more appointments without notifying the office, you may be dismissed from the clinic at the providers discretion.      For prescription refill requests, have your pharmacy contact our office and allow 72 hours for refills to be completed.    Today you received the following chemotherapy and/or immunotherapy agents: bevacizumab-bvvr, oxaliplatin, leucovorin, and fluorouracil.      To help prevent nausea and vomiting after your treatment, we encourage you to take your nausea medication as directed.  BELOW ARE SYMPTOMS THAT SHOULD BE REPORTED IMMEDIATELY: *FEVER GREATER THAN 100.4 F (38 C) OR HIGHER *CHILLS OR SWEATING *NAUSEA AND VOMITING THAT IS NOT CONTROLLED WITH YOUR NAUSEA MEDICATION *UNUSUAL SHORTNESS OF BREATH *UNUSUAL BRUISING OR BLEEDING *URINARY PROBLEMS (pain or burning when urinating, or frequent urination) *BOWEL PROBLEMS (unusual diarrhea, constipation, pain near the anus) TENDERNESS IN MOUTH AND THROAT WITH OR WITHOUT PRESENCE OF ULCERS (sore throat, sores in mouth, or a toothache) UNUSUAL RASH, SWELLING OR PAIN  UNUSUAL VAGINAL DISCHARGE OR ITCHING   Items with * indicate a potential emergency and should be followed up as soon as possible or go to the Emergency Department if any problems should occur.  Please show the CHEMOTHERAPY  ALERT CARD or IMMUNOTHERAPY ALERT CARD at check-in to the Emergency Department and triage nurse.  Should you have questions after your visit or need to cancel or reschedule your appointment, please contact [facility]  Dept: [phone]  and follow the prompts.  Office hours are 8:00 a.m. to 4:30 p.m. Monday - Friday. Please note that voicemails left after 4:00 p.m. may not be returned until the following business day.  We are closed weekends and major holidays. You have access to a nurse at all times for urgent questions. Please call the main number to the clinic Dept: [phone] and follow the prompts.   For any non-urgent questions, you may also contact your provider using MyChart. We now offer e-Visits for anyone 98 and older to request care online for non-urgent symptoms. For details visit [email or web address].   Also download the MyChart app! Go to the app store, search "MyChart", open the app, select [facility], and log in with your MyChart username and password.  Due to Covid, a mask is required upon entering the hospital/clinic. If you do not have a mask, one will be given to you upon arrival. For doctor visits, patients may have 1 support person aged 48 or older with them. For treatment visits, patients cannot have anyone with them due to current Covid guidelines and our immunocompromised population.

## 2021-08-25 ENCOUNTER — Other Ambulatory Visit: Payer: Self-pay

## 2021-08-25 ENCOUNTER — Telehealth: Payer: Self-pay | Admitting: Nurse Practitioner

## 2021-08-25 ENCOUNTER — Inpatient Hospital Stay: Payer: Medicare HMO

## 2021-08-25 VITALS — BP 143/58 | HR 57 | Temp 98.4°F | Resp 17

## 2021-08-25 DIAGNOSIS — Z5112 Encounter for antineoplastic immunotherapy: Secondary | ICD-10-CM | POA: Diagnosis not present

## 2021-08-25 DIAGNOSIS — C182 Malignant neoplasm of ascending colon: Secondary | ICD-10-CM

## 2021-08-25 DIAGNOSIS — D500 Iron deficiency anemia secondary to blood loss (chronic): Secondary | ICD-10-CM

## 2021-08-25 DIAGNOSIS — Z95828 Presence of other vascular implants and grafts: Secondary | ICD-10-CM

## 2021-08-25 MED ORDER — PEGFILGRASTIM-CBQV 6 MG/0.6ML ~~LOC~~ SOSY
6.0000 mg | PREFILLED_SYRINGE | Freq: Once | SUBCUTANEOUS | Status: AC
  Administered 2021-08-25: 6 mg via SUBCUTANEOUS
  Filled 2021-08-25: qty 0.6

## 2021-08-25 MED ORDER — SODIUM CHLORIDE 0.9% FLUSH: 10.0000 mL | Freq: Once | INTRAVENOUS | Status: DC

## 2021-08-25 MED ORDER — HEPARIN SOD (PORK) LOCK FLUSH 100 UNIT/ML IV SOLN: 500.0000 [IU] | Freq: Once | INTRAVENOUS | Status: DC

## 2021-08-25 NOTE — Patient Instructions (Signed)

## 2021-08-25 NOTE — Telephone Encounter (Signed)
No LOS 1/11

## 2021-09-04 ENCOUNTER — Other Ambulatory Visit: Payer: Self-pay | Admitting: Hematology

## 2021-09-07 ENCOUNTER — Inpatient Hospital Stay: Payer: Medicare HMO

## 2021-09-07 ENCOUNTER — Inpatient Hospital Stay: Payer: Medicare HMO | Admitting: Hematology

## 2021-09-07 ENCOUNTER — Other Ambulatory Visit: Payer: Self-pay

## 2021-09-07 ENCOUNTER — Encounter: Payer: Self-pay | Admitting: Hematology

## 2021-09-07 VITALS — BP 157/70

## 2021-09-07 VITALS — BP 169/94 | HR 62 | Temp 98.1°F | Resp 19 | Ht 63.0 in | Wt 130.9 lb

## 2021-09-07 DIAGNOSIS — Z5112 Encounter for antineoplastic immunotherapy: Secondary | ICD-10-CM | POA: Diagnosis not present

## 2021-09-07 DIAGNOSIS — C182 Malignant neoplasm of ascending colon: Secondary | ICD-10-CM | POA: Diagnosis not present

## 2021-09-07 DIAGNOSIS — Z95828 Presence of other vascular implants and grafts: Secondary | ICD-10-CM

## 2021-09-07 DIAGNOSIS — D500 Iron deficiency anemia secondary to blood loss (chronic): Secondary | ICD-10-CM

## 2021-09-07 LAB — CBC WITH DIFFERENTIAL (CANCER CENTER ONLY)
Abs Immature Granulocytes: 0.24 10*3/uL — ABNORMAL HIGH (ref 0.00–0.07)
Basophils Absolute: 0.1 10*3/uL (ref 0.0–0.1)
Basophils Relative: 1 %
Eosinophils Absolute: 0.1 10*3/uL (ref 0.0–0.5)
Eosinophils Relative: 1 %
HCT: 30.8 % — ABNORMAL LOW (ref 36.0–46.0)
Hemoglobin: 10 g/dL — ABNORMAL LOW (ref 12.0–15.0)
Immature Granulocytes: 4 %
Lymphocytes Relative: 15 %
Lymphs Abs: 0.9 10*3/uL (ref 0.7–4.0)
MCH: 31.8 pg (ref 26.0–34.0)
MCHC: 32.5 g/dL (ref 30.0–36.0)
MCV: 98.1 fL (ref 80.0–100.0)
Monocytes Absolute: 0.5 10*3/uL (ref 0.1–1.0)
Monocytes Relative: 8 %
Neutro Abs: 4.4 10*3/uL (ref 1.7–7.7)
Neutrophils Relative %: 71 %
Platelet Count: 162 10*3/uL (ref 150–400)
RBC: 3.14 MIL/uL — ABNORMAL LOW (ref 3.87–5.11)
RDW: 14.6 % (ref 11.5–15.5)
WBC Count: 6.1 10*3/uL (ref 4.0–10.5)
nRBC: 0 % (ref 0.0–0.2)

## 2021-09-07 LAB — CMP (CANCER CENTER ONLY)
ALT: 5 U/L (ref 0–44)
AST: 11 U/L — ABNORMAL LOW (ref 15–41)
Albumin: 3.8 g/dL (ref 3.5–5.0)
Alkaline Phosphatase: 118 U/L (ref 38–126)
Anion gap: 7 (ref 5–15)
BUN: 6 mg/dL — ABNORMAL LOW (ref 8–23)
CO2: 28 mmol/L (ref 22–32)
Calcium: 9.2 mg/dL (ref 8.9–10.3)
Chloride: 106 mmol/L (ref 98–111)
Creatinine: 0.52 mg/dL (ref 0.44–1.00)
GFR, Estimated: >60 mL/min (ref >60)
Glucose, Bld: 96 mg/dL (ref 70–99)
Potassium: 4 mmol/L (ref 3.5–5.1)
Sodium: 141 mmol/L (ref 135–145)
Total Bilirubin: 0.2 mg/dL — ABNORMAL LOW (ref 0.3–1.2)
Total Protein: 7.2 g/dL (ref 6.5–8.1)

## 2021-09-07 LAB — IRON AND IRON BINDING CAPACITY (CC-WL,HP ONLY)
Iron: 71 ug/dL (ref 28–170)
Saturation Ratios: 27 % (ref 10.4–31.8)
TIBC: 267 ug/dL (ref 250–450)
UIBC: 196 ug/dL (ref 148–442)

## 2021-09-07 LAB — TOTAL PROTEIN, URINE DIPSTICK: Protein, ur: 30 mg/dL — AB

## 2021-09-07 MED ORDER — POTASSIUM CHLORIDE CRYS ER 20 MEQ PO TBCR: 20.0000 meq | EXTENDED_RELEASE_TABLET | Freq: Every day | ORAL | 0 refills | Status: DC

## 2021-09-07 MED ORDER — GABAPENTIN 100 MG PO CAPS: 100.0000 mg | ORAL_CAPSULE | Freq: Two times a day (BID) | ORAL | 1 refills | Status: DC

## 2021-09-07 MED ORDER — PROCHLORPERAZINE MALEATE 10 MG PO TABS
10.0000 mg | ORAL_TABLET | Freq: Once | ORAL | Status: AC
  Administered 2021-09-07: 10 mg via ORAL
  Filled 2021-09-07: qty 1

## 2021-09-07 MED ORDER — CAPECITABINE 500 MG PO TABS: 1000.0000 mg/m2 | ORAL_TABLET | Freq: Two times a day (BID) | ORAL | 0 refills | Status: DC

## 2021-09-07 MED ORDER — AMLODIPINE BESYLATE 10 MG PO TABS: 10.0000 mg | ORAL_TABLET | Freq: Every day | ORAL | 0 refills | Status: DC

## 2021-09-07 MED ORDER — SODIUM CHLORIDE 0.9% FLUSH
10.0000 mL | Freq: Once | INTRAVENOUS | Status: AC
  Administered 2021-09-07: 10 mL

## 2021-09-07 MED ORDER — SODIUM CHLORIDE 0.9 % IV SOLN: Freq: Once | INTRAVENOUS | Status: AC

## 2021-09-07 MED ORDER — SODIUM CHLORIDE 0.9 % IV SOLN
5.0000 mg/kg | Freq: Once | INTRAVENOUS | Status: AC
  Administered 2021-09-07: 300 mg via INTRAVENOUS
  Filled 2021-09-07: qty 12

## 2021-09-07 MED ORDER — SODIUM CHLORIDE 0.9 % IV SOLN
10.0000 mg | Freq: Once | INTRAVENOUS | Status: AC
  Administered 2021-09-07: 10 mg via INTRAVENOUS
  Filled 2021-09-07: qty 10

## 2021-09-07 MED ORDER — SODIUM CHLORIDE 0.9% FLUSH
10.0000 mL | INTRAVENOUS | Status: DC | PRN
  Administered 2021-09-07: 10 mL

## 2021-09-07 MED ORDER — LEUCOVORIN CALCIUM INJECTION 350 MG
400.0000 mg/m2 | Freq: Once | INTRAVENOUS | Status: AC
  Administered 2021-09-07: 696 mg via INTRAVENOUS
  Filled 2021-09-07: qty 34.8

## 2021-09-07 MED ORDER — HEPARIN SOD (PORK) LOCK FLUSH 100 UNIT/ML IV SOLN: 500.0000 [IU] | Freq: Once | INTRAVENOUS | Status: DC | PRN

## 2021-09-07 MED ORDER — SODIUM CHLORIDE 0.9 % IV SOLN
2400.0000 mg/m2 | INTRAVENOUS | Status: DC
  Administered 2021-09-07: 4200 mg via INTRAVENOUS
  Filled 2021-09-07: qty 84

## 2021-09-07 MED ORDER — DEXTROSE 5 % IV SOLN: Freq: Once | INTRAVENOUS | Status: DC

## 2021-09-07 NOTE — Patient Instructions (Signed)
[facility] ONCOL[facility]   Discharge Instructions: Thank you for choosing [facility] to provide your oncology and hematology care.   If you have a lab appointment with the [facility], please go directly to the [facility] and check in at the registration area.   Wear comfortable clothing and clothing appropriate for easy access to any Portacath or PICC line.   We strive to give you quality time with your provider. You may need to reschedule your appointment if you arrive late (15 or more minutes).  Arriving late affects you and other patients whose appointments are after yours.  Also, if you miss three or more appointments without notifying the office, you may be dismissed from the clinic at the providers discretion.      For prescription refill requests, have your pharmacy contact our office and allow 72 hours for refills to be completed.    Today you received the following chemotherapy and/or immunotherapy agents: bevacizumab-bvvr, leucovorin, and fluorouracil.      To help prevent nausea and vomiting after your treatment, we encourage you to take your nausea medication as directed.  BELOW ARE SYMPTOMS THAT SHOULD BE REPORTED IMMEDIATELY: *FEVER GREATER THAN 100.4 F (38 C) OR HIGHER *CHILLS OR SWEATING *NAUSEA AND VOMITING THAT IS NOT CONTROLLED WITH YOUR NAUSEA MEDICATION *UNUSUAL SHORTNESS OF BREATH *UNUSUAL BRUISING OR BLEEDING *URINARY PROBLEMS (pain or burning when urinating, or frequent urination) *BOWEL PROBLEMS (unusual diarrhea, constipation, pain near the anus) TENDERNESS IN MOUTH AND THROAT WITH OR WITHOUT PRESENCE OF ULCERS (sore throat, sores in mouth, or a toothache) UNUSUAL RASH, SWELLING OR PAIN  UNUSUAL VAGINAL DISCHARGE OR ITCHING   Items with * indicate a potential emergency and should be followed up as soon as possible or go to the Emergency Department if any problems should occur.  Please show the CHEMOTHERAPY ALERT CARD or  IMMUNOTHERAPY ALERT CARD at check-in to the Emergency Department and triage nurse.  Should you have questions after your visit or need to cancel or reschedule your appointment, please contact [facility]  Dept: [phone]  and follow the prompts.  Office hours are 8:00 a.m. to 4:30 p.m. Monday - Friday. Please note that voicemails left after 4:00 p.m. may not be returned until the following business day.  We are closed weekends and major holidays. You have access to a nurse at all times for urgent questions. Please call the main number to the clinic Dept: [phone] and follow the prompts.   For any non-urgent questions, you may also contact your provider using MyChart. We now offer e-Visits for anyone 29 and older to request care online for non-urgent symptoms. For details visit [email or web address].   Also download the MyChart app! Go to the app store, search "MyChart", open the app, select [facility], and log in with your MyChart username and password.  Due to Covid, a mask is required upon entering the hospital/clinic. If you do not have a mask, one will be given to you upon arrival. For doctor visits, patients may have 1 support person aged 64 or older with them. For treatment visits, patients cannot have anyone with them due to current Covid guidelines and our immunocompromised population.

## 2021-09-07 NOTE — Progress Notes (Signed)
[facility]   [phone] [phone]   Clinic Follow up Note   Patient Care Team: Pcp, No as PCP - General [name], MD as Consulting Physician (Oncology) [name], RN as Nurse Navigator (Oncology)  Date of Service:  09/07/2021  CHIEF COMPLAINT: f/u of metastatic colon cancer  CURRENT THERAPY:  First line FOLFOX, q2 weeks, starting 03/16/21 -bevacizumab added with C2 03/29/21  ASSESSMENT & PLAN:  Kari Patel is a 71 y.o. female with   1. 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 Work up with EGD and colonoscopy on 12/07/20 revealed a large cecal mass. Biopsy confirmed adenocarcinoma.  -She underwent repeat CT CAP on 03/10/21 showing: 7.1 cm cecal mass; similar extensive peritoneal/omental nodularity; similar prominent and mildly enlarged retroperitoneal and mesenteric lymph nodes; numerous scattered bilateral pulmonary nodules; similar mild right-sided hydronephrosis. -Omental biopsy 03/13/21 confirmed metastatic adenocarcinoma, consistent with colon primary.   -port placed on 03/13/21, began first line FOLFOX 03/16/21, tolerated well with mild diarrhea -FO showed MSI stable disease, low mutation burden 4 muts/Mb, K-ras positive, bevacizumab was added with C2 (03/29/21) -CT CAP 06/05/21 showed good response, CEA trending down. Continue current regimen -Tolerating FOLFOX/Beva moderately well with worsening neuropathy and constipation. While she feels she the neuropathy is not impacting her function, I worry continued use of oxali will affect her safety. We will discontinue the oxali at this point; she has received it for 6 months, so I feel this is reasonable. She feels well otherwise from treatment. -restaging CT CAP scheduled for 09/15/21; contrast given today. If her scan looks good, we will switch her to maintenance Xeloda and Beva every 3 weeks from next cycle.  2.  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  -she also reports tingling in her hands. Her sensation is also decreased in her hands but is much better than her feet. Again, she denies impact in function. -I recommend she take B complex supplement. I will also call in gabapentin for her.  3. Symptom Management: Constipation, HTN -secondary to treatment -she was previously recommended magnesium citrate, but she states she was unable to obtain this from several different pharmacies/drug stores. I recommend milk of magnesia, which she notes she has at home. -her BP was higher today; she notes she left her amlodipine at home, as she is between her home and her daughter's. I advised her to monitor at home. I will prescribe some amlodipine for her to use in the meantime.   4. Cold sensitivity rxn 06/07/21 -after completing oxali she stood up and was lightheaded and could not breathe; she was tachycardic. -seen by [facility] PA, supported with warm blanket. Sx resolved -she was given low dose ativan with treatment on 06/22/21 and had no recurrent issues. Will continue ativan with treatment   5. Anemia, iron deficiency  -Likely secondary to #1 -She received IV iron Venofer 200 mg in 03/2021 -Anemia is stable, hgb currently staying around 10.   6. Goal of care discussion  -She understands the incurable nature of her stage IV cancer diagnosis  -Treatment goal is palliative, to control her disease, improve her symptoms, and prolong her life -she is full code now    PLAN: -proceed with 5FU and Beva only today, stop Oxaliplatin due to her neuropathy  -I called in gabapentin  and refilled her amlodipine -CT CAP 09/15/21 -lab, flush, and f/u in 2 weeks, plan to change treatment to maintenance Xeloda and bevacizumab.  I called in Xeloda 1500 mg every 12 on day 1-14 every 21  days for her, plan to start on next visit on 2/9    No problem-specific Assessment & Plan notes found for this encounter.   SUMMARY OF ONCOLOGIC HISTORY: Oncology History Overview Note  Cancer Staging Cancer of right colon [facility]) Staging form: Colon and Rectum - Neuroendocine Tumors, AJCC 8th Edition - Clinical stage from 12/13/2020: Stage IV (cTX, cN1, cM1) - Signed by [name], MD on 03/01/2021    Cancer of right colon [facility])  12/07/2020 Procedure   Colonoscopy  Impression: - non-bleeding internal hemorrhoids - diverticulosis in entire examined colon - one 9 mm polyp in ascending colon removed with hot snare - one 13 mm polyp in transverse colon removed with hot fnare - likely malignant partially-obstructing tumor in cecum.   12/07/2020 Pathology Results   FINAL PATHOLOGIC DIAGNOSIS  SMALL BOWEL, BIOPSIES - duodenal mucosa within normal limits GASTRIC BODY AND ATRIUM, BIOPSY - gastric antral and body-type mucosa with moderate chronic Helicobacter pylori gastritis ASCENDING COLON, POLYPECTOMY - tubular adenoma with prominent eosinophilic infiltrate CECUM, "MASS," BIOPSIES - moderately differentiated adenocarcinoma TRANSVERSE COLON, POLYPECTOMY - high grade dysplasia involving tubulovillous adenoma - cauterized margins negative   12/13/2020 Cancer Staging   Staging form: Colon and Rectum - Neuroendocine Tumors, AJCC 8th Edition - Clinical stage from 12/13/2020: Stage IV (cTX, cN1, cM1) - Signed by [name], MD on 03/01/2021    12/22/2020 Imaging   CT CAP  IMPRESSION: Large 7.5 cm cecal mass with surrounding nodularity/lymphadenopathy 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 The largest  measures 6 mm in the RUL. Findings are nonspecific but can be followed per Fleischner criteria Hiatal hernia   03/01/2021 Initial Diagnosis   Cancer of right colon ([place])   03/10/2021 Imaging   CT CAP  IMPRESSION: 1. Large infiltrative cecal mass measuring approximately 7.1 cm, reflecting the known right-sided colonic neoplasm. 2. Similar extensive peritoneal/omental nodularity throughout the abdomen and pelvis with trace free fluid in the pelvis along the pericolic gutters, consistent with disease involvement. 3. Similar prominent and mildly enlarged retroperitoneal and mesenteric lymph nodes, most likely reflecting disease involvement. 4. Numerous scattered bilateral pulmonary nodules measuring up to 6 mm are similar to prior and nonspecific but suspicious for disease involvement. Attention on follow-up imaging suggested. 5. Similar mild right-sided hydronephrosis without hydroureter, with the distal ureter traversing peritoneal nodularity. Likely reflecting partial UPJ obstruction given lack of hydroureter. Attention on follow-up imaging suggested. Cholelithiasis without findings of acute cholecystitis. 6.  Aortic Atherosclerosis (ICD10-I70.0).   03/13/2021 Pathology Results   FINAL MICROSCOPIC DIAGNOSIS:   A. OMENTUM, NEEDLE CORE BIOPSY:  - Metastatic adenocarcinoma, consistent with clinical impression of a  colonic primary.  See comment    03/13/2021 Miscellaneous      03/16/2021 -  Chemotherapy   Patient is on Treatment Plan : COLORECTAL FOLFOX + Bevacizumab q14d     06/05/2021 Imaging   CT CAP  IMPRESSION: Chest Impression:   1. Bilateral small pulmonary nodules appear unchanged. 2. No new nodularity. 3. No metastatic adenopathy.   Abdomen / Pelvis Impression:   1. Marked decrease in volume of circumferential mass surrounding  the distal ileum leading up the terminal ileum. 2. Marked decrease in size of peritoneal implants along the ventral peritoneal surface of the  lower abdomen. Persistent nodularity again much improved. 3. No evidence of liver metastasis.      INTERVAL HISTORY:  Laurice Iglesia is here for a follow up of metastatic colon cancer. She was last seen by NP Lacie on 08/23/21. She presents to the clinic alone. She reports she is having worsening neuropathy in her feet; she denies impact in function. She notes continued neuropathy in her hands. She is also still having constipation. She notes she overall feels she is tolerating treatment; she denies fatigue.   All other systems were reviewed with the patient and are negative.  MEDICAL HISTORY:  Past Medical History:  Diagnosis Date   Anemia    Colon cancer ([place])    cecum   Heart murmur    Hypertension     SURGICAL HISTORY: Past Surgical History:  Procedure Laterality Date   ABDOMINAL HYSTERECTOMY     IR IMAGING GUIDED PORT INSERTION  03/13/2021    I have reviewed the social history and family history with the patient and they are unchanged from previous note.  ALLERGIES:  is allergic to demerol [meperidine hcl].  MEDICATIONS:  Current Outpatient Medications  Medication Sig Dispense Refill   gabapentin (NEURONTIN) 100 MG capsule Take 1 capsule (100 mg total) by mouth 2 (two) times daily. 60 capsule 1   ondansetron (ZOFRAN) 8 MG tablet Take 1 tablet (8 mg total) by mouth every 8 (eight) hours as needed for nausea or vomiting. 20 tablet 1   prochlorperazine (COMPAZINE) 10 MG tablet Take 1 tablet (10 mg total) by mouth every 6 (six) hours as needed for nausea or vomiting. 30 tablet 1   amLODipine (NORVASC) 10 MG tablet Take 1 tablet (10 mg total) by mouth daily. 20 tablet 0   lidocaine-prilocaine (EMLA) cream Apply 1 application topically as needed. 30 g 4   lisinopril (ZESTRIL) 5 MG tablet TAKE 1 TABLET (5 MG TOTAL) BY MOUTH DAILY. 90 tablet 0   LORazepam (ATIVAN) 0.5 MG tablet Take 1 tablet (0.5 mg total) by mouth every 8 (eight) hours. 30 tablet 0   pantoprazole (PROTONIX) 40 MG  tablet TAKE 1 TABLET BY MOUTH EVERY DAY 90 tablet 1   potassium chloride SA (KLOR-CON M) 20 MEQ tablet Take 1 tablet (20 mEq total) by mouth daily. 30 tablet 0   sucralfate (CARAFATE) 1 GM/10ML suspension Take 10 mLs (1 g total) by mouth 4 (four) times daily -  with meals and at bedtime. 420 mL 0   No current facility-administered medications for this visit.   Facility-Administered Medications Ordered in Other Visits  Medication Dose Route Frequency Provider Last Rate Last Admin   dextrose 5 % solution   Intravenous Once [name], MD   Held at 09/07/21 1041   fluorouracil (ADRUCIL) 4,200 mg in sodium chloride 0.9 % 66 mL chemo infusion  2,400 mg/m2 (Treatment Plan Recorded) Intravenous 1 day or 1 dose [name], MD   4,200 mg at 09/07/21 1219   heparin lock flush 100 unit/mL  500 Units Intracatheter Once PRN [name], MD       sodium chloride flush (NS) 0.9 % injection 10 mL  10 mL Intracatheter PRN [name], MD   10 mL at 09/07/21 1210    PHYSICAL EXAMINATION: ECOG PERFORMANCE STATUS: 1 - Symptomatic but completely ambulatory  Vitals:   09/07/21 0955  BP: (!) 169/94  Pulse: 62  Resp: 19  Temp: 98.1 F (36.7 C)  SpO2: 100%   Wt Readings from Last 3 Encounters:  09/07/21 130 lb 14.4 oz (59.4 kg)  08/23/21 132 lb 4 oz (60 kg)  08/10/21 131 lb (59.4 kg)     GENERAL:alert, no distress and comfortable SKIN: skin color normal, no rashes or significant lesions EYES: normal, Conjunctiva are pink and non-injected, sclera clear  NEURO: alert & oriented x 3 with fluent speech  LABORATORY DATA:  I have reviewed the data as listed CBC Latest Ref Rng & Units 09/07/2021 08/23/2021 08/10/2021  WBC 4.0 - 10.5 K/uL 6.1 4.0 2.6(L)  Hemoglobin 12.0 - 15.0 g/dL 10.0(L) 10.1(L) 10.1(L)  Hematocrit 36.0 - 46.0 % 30.8(L) 30.4(L) 31.7(L)  Platelets 150 - 400 K/uL 162 133(L) 146(L)     CMP Latest Ref Rng & Units 09/07/2021 08/23/2021 08/10/2021  Glucose 70 - 99 mg/dL 96 124(H) 113(H)  BUN 8 -  23 mg/dL 6(L) 6(L) 7(L)  Creatinine 0.44 - 1.00 mg/dL 0.52 0.50 0.56  Sodium 135 - 145 mmol/L 141 139 140  Potassium 3.5 - 5.1 mmol/L 4.0 3.6 4.1  Chloride 98 - 111 mmol/L 106 104 105  CO2 22 - 32 mmol/L _0 Calcium 8.9 - 10.3 mg/dL 9.2 9.2 9.4  Total Protein 6.5 - 8.1 g/dL 7.2 7.3 7.3  Total Bilirubin 0.3 - 1.2 mg/dL 0.2(L) 0.4 0.3  Alkaline Phos 38 - 126 U/L 118 100 74  AST 15 - 41 U/L 11(L) 9(L) 11(L)  ALT 0 - 44 U/L 5 <5 5      RADIOGRAPHIC STUDIES: I have personally reviewed the radiological images as listed and agreed with the findings in the report. No results found.    No orders of the defined types were placed in this encounter.  All questions were answered. The patient knows to call the clinic with any problems, questions or concerns. No barriers to learning was detected. The total time spent in the appointment was 30 minutes.     [name], MD 09/07/2021   I, Wilburn Mylar, am acting as scribe for [name], MD.   I have reviewed the above documentation for accuracy and completeness, and I agree with the above.

## 2021-09-08 ENCOUNTER — Telehealth: Payer: Self-pay | Admitting: Pharmacist

## 2021-09-08 ENCOUNTER — Encounter: Payer: Self-pay | Admitting: Hematology

## 2021-09-08 ENCOUNTER — Other Ambulatory Visit (HOSPITAL_COMMUNITY): Payer: Self-pay

## 2021-09-08 ENCOUNTER — Telehealth: Payer: Self-pay

## 2021-09-08 DIAGNOSIS — C182 Malignant neoplasm of ascending colon: Secondary | ICD-10-CM

## 2021-09-08 MED ORDER — CAPECITABINE 500 MG PO TABS
1000.0000 mg/m2 | ORAL_TABLET | Freq: Two times a day (BID) | ORAL | 0 refills | Status: DC
  Filled 2021-09-08: qty 84, 14d supply, fill #0
  Filled 2021-09-18: qty 84, 21d supply, fill #0

## 2021-09-08 NOTE — Telephone Encounter (Signed)
Oral Oncology Patient Advocate Encounter   Received notification from [facility] that prior authorization for Xeloda is required.   PA submitted on CoverMyMeds Key [id number] Status is pending   Oral Oncology Clinic will continue to follow.  [place] Patient [place] Phone [phone] Fax [phone] 09/08/2021 9:47 AM

## 2021-09-08 NOTE — Telephone Encounter (Signed)
Oral Oncology Patient Advocate Encounter  Prior Authorization for Xeloda has been approved.    PA# [id number] Effective dates: 09/08/21 through 08/12/22  Patients co-pay is $28.91  Oral Oncology Clinic will continue to follow.   [name] Patient [name] Phone [phone] Fax [phone] 09/08/2021 11:36 AM

## 2021-09-08 NOTE — Telephone Encounter (Signed)
Oral Oncology Pharmacist Encounter  Received new prescription for Xeloda (capecitabine) for the treatment of metastatic colon cancer in conjunction with bevacizumab, planned duration until disease progression or unacceptable drug toxicity.  CMP and CBC w/ diff from 09/07/21 assessed, no relevant lab abnormalities noted. Prescription dose and frequency assessed for appropriateness. Planned start date per MD note is 09/21/21  Current medication list in Epic reviewed, DDIs with Xeloda identified: Category C DDI between Xeloda and pantoprazole - proton-pump inhibitors can decrease efficacy of Xeloda - will discuss with patient alternatives to pantoprazole, such as H2RA's like famotidine while on Xeloda. Category C DDI between Xeloda and Ondansetron due to risk of Qtc prolongation with fluorouracil products. Noted patient only taking PRN and PO route, risk higher with IV administration. No change in therapy warranted at this time.   Evaluated chart and no patient barriers to medication adherence noted.   Patient agreement for treatment documented in MD note on 09/07/21.  Prescription has been e-scribed to the [facility] for benefits analysis and approval.  Oral Oncology Clinic will continue to follow for insurance authorization, copayment issues, initial counseling and start date.  Leron Croak, PharmD, BCPS Hematology/Oncology Clinical Pharmacist [facility] [place] [phone] 09/08/2021 9:19 AM

## 2021-09-09 ENCOUNTER — Other Ambulatory Visit: Payer: Self-pay

## 2021-09-09 ENCOUNTER — Inpatient Hospital Stay: Payer: Medicare HMO

## 2021-09-09 VITALS — BP 133/64 | HR 70 | Temp 98.3°F | Resp 18

## 2021-09-09 DIAGNOSIS — Z5112 Encounter for antineoplastic immunotherapy: Secondary | ICD-10-CM | POA: Diagnosis not present

## 2021-09-09 DIAGNOSIS — C182 Malignant neoplasm of ascending colon: Secondary | ICD-10-CM

## 2021-09-09 MED ORDER — HEPARIN SOD (PORK) LOCK FLUSH 100 UNIT/ML IV SOLN: 500.0000 [IU] | Freq: Once | INTRAVENOUS | Status: DC | PRN

## 2021-09-09 MED ORDER — SODIUM CHLORIDE 0.9% FLUSH
10.0000 mL | INTRAVENOUS | Status: DC | PRN
  Administered 2021-09-09: 10 mL

## 2021-09-12 ENCOUNTER — Telehealth: Payer: Self-pay

## 2021-09-12 NOTE — Telephone Encounter (Signed)
-----   Message from Alla Feeling, NP sent at 09/10/2021 11:38 PM EST ----- Please let her know iron level is good, stable, no need for additional IV iron at this time.  Thanks, Regan Rakers, NP

## 2021-09-12 NOTE — Telephone Encounter (Signed)
This nurse attempted to reach this patient related to lab results and recommendations from the provider.  This nurse attempted call twice and the phone rung once and then disconnected on both attempts.  Unable to leave a message.  This nurse will attempt to reach patient again.

## 2021-09-15 ENCOUNTER — Ambulatory Visit (HOSPITAL_COMMUNITY)
Admission: RE | Admit: 2021-09-15 | Discharge: 2021-09-15 | Disposition: A | Discharge status: Home / Self Care | Payer: Medicare HMO | Source: Ambulatory Visit | Attending: Nurse Practitioner | Admitting: Nurse Practitioner

## 2021-09-15 ENCOUNTER — Other Ambulatory Visit: Payer: Self-pay

## 2021-09-15 ENCOUNTER — Encounter (HOSPITAL_COMMUNITY): Payer: Self-pay

## 2021-09-15 DIAGNOSIS — C182 Malignant neoplasm of ascending colon: Secondary | ICD-10-CM | POA: Diagnosis present

## 2021-09-15 MED ORDER — IOHEXOL 300 MG/ML  SOLN
100.0000 mL | Freq: Once | INTRAMUSCULAR | Status: AC | PRN
  Administered 2021-09-15: 80 mL via INTRAVENOUS

## 2021-09-15 MED ORDER — SODIUM CHLORIDE (PF) 0.9 % IJ SOLN
INTRAMUSCULAR | Status: AC
  Filled 2021-09-15: qty 50

## 2021-09-18 ENCOUNTER — Other Ambulatory Visit (HOSPITAL_COMMUNITY): Payer: Self-pay

## 2021-09-18 ENCOUNTER — Telehealth: Payer: Self-pay

## 2021-09-18 NOTE — Telephone Encounter (Signed)
Called patient regarding iron level from last labs. I explained to her that everything was good and within limits, and that she doesn't need additional IV iron at this time per Regan Rakers, NP. Patient verbalized understanding and had no further questions or concerns.

## 2021-09-18 NOTE — Telephone Encounter (Signed)
Oral Chemotherapy Pharmacist Encounter  I spoke with patient for overview of: Xeloda (capecitabine) for the treatment of metastatic colon cancer in conjunction with bevacizumab, planned duration until disease progression or unacceptable drug toxicity.   Counseled patient on administration, dosing, side effects, monitoring, drug-food interactions, safe handling, storage, and disposal. Confirmed patient is no longer taking pantoprazole. Medication list updated.   Patient will take Xeloda 500mg  tablets, 3 tablets (1500mg ) by mouth in AM and 3 tabs (1500mg ) by mouth in PM, within 30 minutes of finishing meals, for 14 days on, 7 days off, repeated every 21 days.  Xeloda start date: 09/21/21  Adverse effects include but are not limited to: fatigue, decreased blood counts, GI upset, diarrhea, mouth sores, and hand-foot syndrome.  Patient has anti-emetic on hand and knows to take it if nausea develops.   Patient will obtain anti diarrheal and alert the office of 4 or more loose stools above baseline.  Reviewed with patient importance of keeping a medication schedule and plan for any missed doses. No barriers to medication adherence identified.  Medication reconciliation performed and medication/allergy list updated.  Patient states she will pick this up from the [facility] prior to 09/21/21.  Patient informed the pharmacy will reach out 5-7 days prior to needing next fill of Xeloda to coordinate continued medication acquisition to prevent break in therapy.  All questions answered.  Ms. Kauth voiced understanding and appreciation.   Medication education handout and medication calendar will be given to patient in clinic on 09/21/21. Patient knows to call the office with questions or concerns. Oral Chemotherapy Clinic phone number provided to patient.   Leron Croak, PharmD, BCPS Hematology/Oncology Clinical Pharmacist [facility] [place] [phone] 09/18/2021 4:42 PM

## 2021-09-19 ENCOUNTER — Other Ambulatory Visit: Payer: Self-pay

## 2021-09-19 ENCOUNTER — Other Ambulatory Visit: Payer: Self-pay | Admitting: Hematology

## 2021-09-19 NOTE — Progress Notes (Signed)
DISCONTINUE ON PATHWAY REGIMEN - Colorectal     A cycle is every 14 days:     Bevacizumab-xxxx      Oxaliplatin      Leucovorin      Fluorouracil      Fluorouracil   **Always confirm dose/schedule in your pharmacy ordering system**  REASON: Disease Progression PRIOR TREATMENT: [id number]: [id number] + Bevacizumab q14 Days TREATMENT RESPONSE: Partial Response (PR)  START ON PATHWAY REGIMEN - Colorectal     A cycle is every 14 days:     Bevacizumab-xxxx      Irinotecan      Leucovorin      Fluorouracil      Fluorouracil   **Always confirm dose/schedule in your pharmacy ordering system**  Patient Characteristics: Distant Metastases, Nonsurgical Candidate, KRAS/NRAS Mutation Positive/Unknown (BRAF V600 Wild-Type/Unknown), Standard Cytotoxic Therapy, Second Line Standard Cytotoxic Therapy, Bevacizumab Eligible Tumor Location: Colon Therapeutic Status: Distant Metastases Microsatellite/Mismatch Repair Status: MSS/pMMR BRAF Mutation Status: Wild-Type (no mutation) KRAS/NRAS Mutation Status: Mutation Positive Standard Cytotoxic Line of Therapy: Second Line Standard Cytotoxic Therapy Bevacizumab Eligibility: Eligible Intent of Therapy: Non-Curative / Palliative Intent, Discussed with Patient

## 2021-09-20 ENCOUNTER — Other Ambulatory Visit: Payer: Self-pay

## 2021-09-20 ENCOUNTER — Other Ambulatory Visit (HOSPITAL_COMMUNITY): Payer: Self-pay

## 2021-09-20 MED FILL — Dexamethasone Sodium Phosphate Inj 100 MG/10ML: INTRAMUSCULAR | Qty: 1 | Status: AC

## 2021-09-20 NOTE — Progress Notes (Addendum)
Discontinued Capecitabine PO via staff message from Leron Croak, [facility] to this RN, Cira Rue, NP, and [name].  Pt tx changed to FOLFIRI + Bev.

## 2021-09-21 ENCOUNTER — Encounter: Payer: Self-pay | Admitting: Nurse Practitioner

## 2021-09-21 ENCOUNTER — Inpatient Hospital Stay: Payer: Medicare HMO | Attending: Hematology

## 2021-09-21 ENCOUNTER — Other Ambulatory Visit: Payer: Self-pay

## 2021-09-21 ENCOUNTER — Other Ambulatory Visit: Payer: Self-pay | Admitting: Nurse Practitioner

## 2021-09-21 ENCOUNTER — Inpatient Hospital Stay: Payer: Medicare HMO | Admitting: Nurse Practitioner

## 2021-09-21 ENCOUNTER — Inpatient Hospital Stay: Payer: Medicare HMO

## 2021-09-21 VITALS — BP 119/72

## 2021-09-21 VITALS — BP 151/71 | HR 76 | Temp 97.8°F | Resp 17 | Ht 63.0 in | Wt 127.7 lb

## 2021-09-21 DIAGNOSIS — C182 Malignant neoplasm of ascending colon: Secondary | ICD-10-CM | POA: Diagnosis not present

## 2021-09-21 DIAGNOSIS — D509 Iron deficiency anemia, unspecified: Secondary | ICD-10-CM | POA: Insufficient documentation

## 2021-09-21 DIAGNOSIS — Z5112 Encounter for antineoplastic immunotherapy: Secondary | ICD-10-CM | POA: Insufficient documentation

## 2021-09-21 DIAGNOSIS — Z5111 Encounter for antineoplastic chemotherapy: Secondary | ICD-10-CM | POA: Diagnosis present

## 2021-09-21 DIAGNOSIS — C180 Malignant neoplasm of cecum: Secondary | ICD-10-CM | POA: Diagnosis present

## 2021-09-21 DIAGNOSIS — G629 Polyneuropathy, unspecified: Secondary | ICD-10-CM | POA: Diagnosis not present

## 2021-09-21 DIAGNOSIS — D500 Iron deficiency anemia secondary to blood loss (chronic): Secondary | ICD-10-CM

## 2021-09-21 DIAGNOSIS — C786 Secondary malignant neoplasm of retroperitoneum and peritoneum: Secondary | ICD-10-CM | POA: Diagnosis not present

## 2021-09-21 LAB — CMP (CANCER CENTER ONLY)
ALT: 7 U/L (ref 0–44)
AST: 13 U/L — ABNORMAL LOW (ref 15–41)
Albumin: 4.1 g/dL (ref 3.5–5.0)
Alkaline Phosphatase: 103 U/L (ref 38–126)
Anion gap: 8 (ref 5–15)
BUN: 9 mg/dL (ref 8–23)
CO2: 26 mmol/L (ref 22–32)
Calcium: 9.5 mg/dL (ref 8.9–10.3)
Chloride: 105 mmol/L (ref 98–111)
Creatinine: 0.49 mg/dL (ref 0.44–1.00)
GFR, Estimated: >60 mL/min (ref >60)
Glucose, Bld: 123 mg/dL — ABNORMAL HIGH (ref 70–99)
Potassium: 3.7 mmol/L (ref 3.5–5.1)
Sodium: 139 mmol/L (ref 135–145)
Total Bilirubin: 0.3 mg/dL (ref 0.3–1.2)
Total Protein: 7.9 g/dL (ref 6.5–8.1)

## 2021-09-21 LAB — CBC WITH DIFFERENTIAL (CANCER CENTER ONLY)
Abs Immature Granulocytes: 0 10*3/uL (ref 0.00–0.07)
Basophils Absolute: 0 10*3/uL (ref 0.0–0.1)
Basophils Relative: 1 %
Eosinophils Absolute: 0.1 10*3/uL (ref 0.0–0.5)
Eosinophils Relative: 2 %
HCT: 30.1 % — ABNORMAL LOW (ref 36.0–46.0)
Hemoglobin: 9.8 g/dL — ABNORMAL LOW (ref 12.0–15.0)
Immature Granulocytes: 0 %
Lymphocytes Relative: 25 %
Lymphs Abs: 0.7 10*3/uL (ref 0.7–4.0)
MCH: 31.6 pg (ref 26.0–34.0)
MCHC: 32.6 g/dL (ref 30.0–36.0)
MCV: 97.1 fL (ref 80.0–100.0)
Monocytes Absolute: 0.4 10*3/uL (ref 0.1–1.0)
Monocytes Relative: 17 %
Neutro Abs: 1.4 10*3/uL — ABNORMAL LOW (ref 1.7–7.7)
Neutrophils Relative %: 55 %
Platelet Count: 171 10*3/uL (ref 150–400)
RBC: 3.1 MIL/uL — ABNORMAL LOW (ref 3.87–5.11)
RDW: 14.3 % (ref 11.5–15.5)
WBC Count: 2.6 10*3/uL — ABNORMAL LOW (ref 4.0–10.5)
nRBC: 0 % (ref 0.0–0.2)

## 2021-09-21 LAB — IRON AND IRON BINDING CAPACITY (CC-WL,HP ONLY)
Iron: 41 ug/dL (ref 28–170)
Saturation Ratios: 14 % (ref 10.4–31.8)
TIBC: 294 ug/dL (ref 250–450)
UIBC: 253 ug/dL (ref 148–442)

## 2021-09-21 LAB — FERRITIN: Ferritin: 522 ng/mL — ABNORMAL HIGH (ref 11–307)

## 2021-09-21 LAB — CEA (IN HOUSE-CHCC): CEA (CHCC-In House): 4.47 ng/mL (ref 0.00–5.00)

## 2021-09-21 MED ORDER — ATROPINE SULFATE 1 MG/ML IV SOLN
0.5000 mg | Freq: Once | INTRAVENOUS | Status: AC | PRN
  Administered 2021-09-21: 0.5 mg via INTRAVENOUS

## 2021-09-21 MED ORDER — SODIUM CHLORIDE 0.9 % IV SOLN
5.0000 mg/kg | Freq: Once | INTRAVENOUS | Status: AC
  Administered 2021-09-21: 300 mg via INTRAVENOUS
  Filled 2021-09-21: qty 12

## 2021-09-21 MED ORDER — HEPARIN SOD (PORK) LOCK FLUSH 100 UNIT/ML IV SOLN
500.0000 [IU] | Freq: Once | INTRAVENOUS | Status: AC | PRN
  Administered 2021-09-21: 500 [IU]

## 2021-09-21 MED ORDER — SODIUM CHLORIDE 0.9 % IV SOLN: Freq: Once | INTRAVENOUS | Status: AC

## 2021-09-21 MED ORDER — SODIUM CHLORIDE 0.9% FLUSH
10.0000 mL | INTRAVENOUS | Status: DC | PRN
  Administered 2021-09-21: 10 mL

## 2021-09-21 MED ORDER — LORAZEPAM 1 MG PO TABS
0.5000 mg | ORAL_TABLET | Freq: Once | ORAL | Status: AC
  Administered 2021-09-21: 0.5 mg via ORAL
  Filled 2021-09-21: qty 1

## 2021-09-21 MED ORDER — PALONOSETRON HCL INJECTION 0.25 MG/5ML
0.2500 mg | Freq: Once | INTRAVENOUS | Status: AC
  Administered 2021-09-21: 0.25 mg via INTRAVENOUS
  Filled 2021-09-21: qty 5

## 2021-09-21 MED ORDER — SODIUM CHLORIDE 0.9 % IV SOLN
10.0000 mg | Freq: Once | INTRAVENOUS | Status: AC
  Administered 2021-09-21: 10 mg via INTRAVENOUS
  Filled 2021-09-21: qty 10

## 2021-09-21 MED ORDER — SODIUM CHLORIDE 0.9 % IV SOLN
150.0000 mg/m2 | Freq: Once | INTRAVENOUS | Status: AC
  Administered 2021-09-21: 240 mg via INTRAVENOUS
  Filled 2021-09-21: qty 12

## 2021-09-21 NOTE — Progress Notes (Addendum)
[place]   Telephone:([phone] Fax:([phone]) [phone]   Clinic Follow up Note   Patient Care Team: Pcp, No as PCP - General [name], MD as Consulting Physician (Oncology) [name], RN as Nurse Navigator (Oncology) 09/21/2021  CHIEF COMPLAINT: Follow-up metastatic colon cancer  SUMMARY OF ONCOLOGIC HISTORY: Oncology History Overview Note  Cancer Staging Cancer of right colon [facility] Staging form: Colon and Rectum - Neuroendocine Tumors, AJCC 8th Edition - Clinical stage from 12/13/2020: Stage IV (cTX, cN1, cM1) - Signed by [name], MD on 03/01/2021    Cancer of right colon ([place])  12/07/2020 Procedure   Colonoscopy  Impression: - non-bleeding internal hemorrhoids - diverticulosis in entire examined colon - one 9 mm polyp in ascending colon removed with hot snare - one 13 mm polyp in transverse colon removed with hot fnare - likely malignant partially-obstructing tumor in cecum.   12/07/2020 Pathology Results   FINAL PATHOLOGIC DIAGNOSIS  SMALL BOWEL, BIOPSIES - duodenal mucosa within normal limits GASTRIC BODY AND ATRIUM, BIOPSY - gastric antral and body-type mucosa with moderate chronic Helicobacter pylori gastritis ASCENDING COLON, POLYPECTOMY - tubular adenoma with prominent eosinophilic infiltrate CECUM, "MASS," BIOPSIES - moderately differentiated adenocarcinoma TRANSVERSE COLON, POLYPECTOMY - high grade dysplasia involving tubulovillous adenoma - cauterized margins negative   12/13/2020 Cancer Staging   Staging form: Colon and Rectum - Neuroendocine Tumors, AJCC 8th Edition - Clinical stage from 12/13/2020: Stage IV (cTX, cN1, cM1) - Signed by [name], MD on 03/01/2021    12/22/2020 Imaging   CT CAP  IMPRESSION: Large 7.5 cm cecal mass with surrounding nodularity/lymphadenopathy 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 The largest measures 6 mm in the RUL. Findings are nonspecific but can be followed per Fleischner criteria Hiatal hernia   03/01/2021 Initial Diagnosis   Cancer of right colon ([place])   03/10/2021 Imaging   CT CAP  IMPRESSION: 1. Large infiltrative cecal mass measuring approximately 7.1 cm, reflecting the known right-sided colonic neoplasm. 2. Similar extensive peritoneal/omental nodularity throughout the abdomen and pelvis with trace free fluid in the pelvis along the pericolic gutters, consistent with disease involvement. 3. Similar prominent and mildly enlarged retroperitoneal and mesenteric lymph nodes, most likely reflecting disease involvement. 4. Numerous scattered bilateral pulmonary nodules measuring up to 6 mm are similar to prior and nonspecific but suspicious for disease involvement. Attention on follow-up imaging suggested. 5. Similar mild right-sided hydronephrosis without hydroureter, with the distal ureter traversing peritoneal nodularity. Likely reflecting partial UPJ obstruction given lack of hydroureter. Attention on follow-up imaging suggested. Cholelithiasis without findings of acute cholecystitis. 6.  Aortic Atherosclerosis (ICD10-I70.0).   03/13/2021 Pathology Results   FINAL MICROSCOPIC DIAGNOSIS:   A. OMENTUM, NEEDLE CORE BIOPSY:  - Metastatic adenocarcinoma, consistent with clinical impression of a  colonic primary.  See comment    03/13/2021 Miscellaneous      03/16/2021 - 09/09/2021 Chemotherapy   Patient is on Treatment Plan : COLORECTAL FOLFOX + Bevacizumab q14d     06/05/2021 Imaging   CT CAP  IMPRESSION: Chest Impression:   1. Bilateral small pulmonary nodules appear unchanged. 2. No new nodularity. 3. No metastatic adenopathy.   Abdomen / Pelvis Impression:   1. Marked  decrease in volume of circumferential mass surrounding the distal ileum leading up the terminal ileum. 2. Marked decrease in  size of peritoneal implants along the ventral peritoneal surface of the lower abdomen. Persistent nodularity again much improved. 3. No evidence of liver metastasis.   09/15/2021 Imaging   CT CAP IMPRESSION: 1. Multiple scattered tiny bilateral pulmonary nodules are stable to minimally increased in size in the interval. There is a new 11 mm anterior left upper lobe pulmonary nodule on today's study. 2. Interval progression of soft tissue nodularity in the lower omentum with new disease now noted more cranially in the omentum. 3. Wall thickening in the terminal ileum appears qualitatively progressed in the interval with a string sign lumen on today's study. No features to suggest obstruction at this point. 4. Stable mild right hydronephrosis without associated hydroureter. 5. Colonic diverticulosis without diverticulitis. 6. Layering sludge or tiny stones in the gallbladder fundus. 7. Aortic Atherosclerosis (ICD10-I70.0).   09/21/2021 -  Chemotherapy   Patient is on Treatment Plan : COLORECTAL FOLFIRI / BEVACIZUMAB Q14D       CURRENT THERAPY: First-line FOLFOX every 2 weeks starting 03/16/2021, bevacizumab added with cycle 2 on 03/29/21.  Pending second line Irinotecan plus bevacizumab starting 09/21/2021  INTERVAL HISTORY: Kari Patel returns for follow-up and treatment as scheduled.  Last seen by [name] 09/07/2021 and underwent 5-FU and Beva only, oxaliplatin was stopped due to neuropathy.  She underwent restaging CT CAP last week and is here to review results.  Today she presents by herself, feeling well overall, in fact feels the best she has in a while.  He has been eating healthy fruits and vegetables and limiting carbs and attempts to be healthy.  She is losing some weight.  Manages constipation with meds and diet.  Neuropathy is worse in her feet, constant and tingling.   Not much neuropathy in her hands.  She has gabapentin but was afraid to start due to potential side effects.  Denies any other changes or specific complaints including fever, chills, cough, chest pain, dyspnea, leg edema, mucositis, rash, or any pain.    MEDICAL HISTORY:  Past Medical History:  Diagnosis Date   Anemia    Colon cancer ([place])    cecum   Heart murmur    Hypertension     SURGICAL HISTORY: Past Surgical History:  Procedure Laterality Date   ABDOMINAL HYSTERECTOMY     IR IMAGING GUIDED PORT INSERTION  03/13/2021    I have reviewed the social history and family history with the patient and they are unchanged from previous note.  ALLERGIES:  is allergic to demerol [meperidine hcl].  MEDICATIONS:  Current Outpatient Medications  Medication Sig Dispense Refill   ondansetron (ZOFRAN) 8 MG tablet Take 1 tablet (8 mg total) by mouth every 8 (eight) hours as needed for nausea or vomiting. 20 tablet 1   prochlorperazine (COMPAZINE) 10 MG tablet Take 1 tablet (10 mg total) by mouth every 6 (six) hours as needed for nausea or vomiting. 30 tablet 1   amLODipine (NORVASC) 10 MG tablet Take 1 tablet (10 mg total) by mouth daily. 20 tablet 0   gabapentin (NEURONTIN) 100 MG capsule Take 1 capsule (100 mg total) by mouth 2 (two) times daily. 60 capsule 1   lidocaine-prilocaine (EMLA) cream Apply 1 application topically as needed. 30 g 4   lisinopril (ZESTRIL) 5 MG tablet TAKE 1 TABLET (5 MG TOTAL) BY MOUTH DAILY. 90 tablet 0   LORazepam (ATIVAN) 0.5 MG tablet Take 1 tablet (0.5 mg total) by mouth every 8 (eight) hours. 30 tablet 0   potassium chloride SA (  KLOR-CON M) 20 MEQ tablet Take 1 tablet (20 mEq total) by mouth daily. 30 tablet 0   sucralfate (CARAFATE) 1 GM/10ML suspension Take 10 mLs (1 g total) by mouth 4 (four) times daily -  with meals and at bedtime. 420 mL 0   No current facility-administered medications for this visit.   Facility-Administered Medications Ordered in Other  Visits  Medication Dose Route Frequency Provider Last Rate Last Admin   sodium chloride flush (NS) 0.9 % injection 10 mL  10 mL Intracatheter PRN [name], MD   10 mL at 09/21/21 1310    PHYSICAL EXAMINATION: ECOG PERFORMANCE STATUS: 0 - Asymptomatic  Vitals:   09/21/21 0904  BP: (!) 151/71  Pulse: 76  Resp: 17  Temp: 97.8 F (36.6 C)  SpO2: 100%   Filed Weights   09/21/21 0904  Weight: 127 lb 11.2 oz (57.9 kg)    GENERAL:alert, no distress and comfortable SKIN: No rash EYES: sclera clear LUNGS: clear with normal breathing effort HEART: regular rate & rhythm, no lower extremity edema ABDOMEN:abdomen soft, non-tender and normal bowel sounds NEURO: alert & oriented x 3 with fluent speech, no focal motor deficits PAC without erythema   LABORATORY DATA:  I have reviewed the data as listed CBC Latest Ref Rng & Units 09/21/2021 09/07/2021 08/23/2021  WBC 4.0 - 10.5 K/uL 2.6(L) 6.1 4.0  Hemoglobin 12.0 - 15.0 g/dL 9.8(L) 10.0(L) 10.1(L)  Hematocrit 36.0 - 46.0 % 30.1(L) 30.8(L) 30.4(L)  Platelets 150 - 400 K/uL 171 162 133(L)     CMP Latest Ref Rng & Units 09/21/2021 09/07/2021 08/23/2021  Glucose 70 - 99 mg/dL 123(H) 96 124(H)  BUN 8 - 23 mg/dL 9 6(L) 6(L)  Creatinine 0.44 - 1.00 mg/dL 0.49 0.52 0.50  Sodium 135 - 145 mmol/L 139 141 139  Potassium 3.5 - 5.1 mmol/L 3.7 4.0 3.6  Chloride 98 - 111 mmol/L 105 106 104  CO2 22 - 32 mmol/L $RemoveB'26 28 27  'FaFTLyXm$ Calcium 8.9 - 10.3 mg/dL 9.5 9.2 9.2  Total Protein 6.5 - 8.1 g/dL 7.9 7.2 7.3  Total Bilirubin 0.3 - 1.2 mg/dL 0.3 0.2(L) 0.4  Alkaline Phos 38 - 126 U/L 103 118 100  AST 15 - 41 U/L 13(L) 11(L) 9(L)  ALT 0 - 44 U/L 7 5 <5      RADIOGRAPHIC STUDIES: I have personally reviewed the radiological images as listed and agreed with the findings in the report. No results found.   ASSESSMENT & PLAN: Kari Patel is a 71 y.o. female with    1. 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 Work up with EGD and colonoscopy on 12/07/20 revealed a large cecal mass. Biopsy confirmed adenocarcinoma.  -She underwent repeat CT CAP on 03/10/21 showing: 7.1 cm cecal mass; similar extensive peritoneal/omental nodularity; similar prominent and mildly enlarged retroperitoneal and mesenteric lymph nodes; numerous scattered bilateral pulmonary nodules; similar mild right-sided hydronephrosis. -Omental biopsy 03/13/21 confirmed metastatic adenocarcinoma, consistent with colon primary.   -port placed on 03/13/21, began first line FOLFOX 03/16/21, tolerated well with mild diarrhea -FO showed MSI stable disease, low mutation burden 4 muts/Mb, K-ras positive, bevacizumab was added with C2 (03/29/21) -CT CAP 06/05/2021 showed good response, CEA trending down. continue current regimen -Tolerating FOLFOX/Beva moderately well with neuropathy and constipation. Oxali was dose-reduced and eventually held with last cycle due to neuropathy -Kari Patel appears stable.  She completed last cycle of 5-FU/Beva alone.  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  Reluctant to take gabapentin but agrees to try low-dose. -She is clinically doing well without significant cancer related symptoms or chemo side effects.  She has a good performance status. -We reviewed her restaging CT CAP 09/15/21 which showed mild progression in the lungs and omentum, with new 11 mm LUL nodule, and new soft tissue omental nodule.  Overall this is still better than her initial diagnosis, but slightly worse than last CT in October.  CEA normalized as of 1 month ago.  -Kari Patel is understandably disappointed with today's result; we reviewed she still has low disease burden and treatable disease.  Clinically she is doing very well. We offered to call her family but patient declined. -Patient seen with [name], we recommend to switch to second line chemotherapy.  We discussed FOLFIRI versus single agent  irinotecan and bevacizumab.  Patient would like break from 5FU pump.  She understands if next restaging shows progression we would recommend to add it back --Chemotherapy consent: Side effects of irinotecan including but not limited to fatigue, nausea, vomiting, diarrhea, hair loss, liver and kidney dysfunction, neutropenic fever, need for blood transfusion, bleeding, were discussed with patient in great detail. She agrees to proceed.  The goal remains palliative.  Plan to restage after 4 cycles -Labs reviewed, anemia is stable, ANC 1.4.  Adequate to proceed with dose-reduced irinotecan with cycle 1 and bevacizumab today.  No need for G-CSF at this time. -Follow-up and cycle 2 in 2 weeks  2. Cold sensitivity rxn 06/07/21 -after completing oxali she stood up and was lightheaded and could not breath -tachy, other VS stable -[facility], supported with warm blanket. Sx resolved -likely related to oxali but not allergic reaction -she was given low dose ativan with treatment on 06/22/21 and had no recurrent issues. Will continue ativan with treatment   3. Anemia, iron deficiency  -Likely secondary to #1 -She received IV iron Venofer 200 mg in 03/2021 -She has constipation from iron infusion, we reviewed symptom management -Anemia is stable, no need for additional IV iron at this time   4.  Goal of care discussion  -She understands the incurable nature of her stage IV cancer diagnosis  -Treatment goal is palliative, to control her disease, improve her symptoms, and prolong her life -she is full code now   PLAN -Labs and CT reviewed -Discontinue FOLFOX due to disease progression -Begin second line irinotecan, dose reduced with C1, and continue bevacizumab q2 weeks.  -Reviewed potential SE's, symptom management, benefit, and palliative goal of therapy. She agrees to proceed -ANC 1.4, reviewed precautions. Ok to hold gcsf. Treatment plan has been adjusted  -Reviewed nutrition/weight -F/up and  cycle 2 in 2 weeks -Patient seen with [name]   All questions were answered. The patient knows to call the clinic with any problems, questions or concerns. No barriers to learning were detected.      Alla Feeling, NP 09/21/21    Addendum  I have seen the patient, examined her. I agree with the assessment and and plan and have edited the notes.   I personally reviewed her restaging CT scan images, and discussed the findings with her.  She has been clinically doing very well, and her tumor marker has come down to normal, but is surprisingly the scan showed slightly disease progression in the peritoneum. I recommend a change her chemotherapy to FOLFIRI, however patient is very reluctant to take tensive chemotherapy, due to mild  progression, I recommend change to irinotecan alone and continue Beva.  She agrees with the plan.  We will proceed today. All questions were answered.  [name]  09/21/2021

## 2021-09-21 NOTE — Progress Notes (Signed)
Per Cira Rue, NP: OK to treat with ANC of 1.4.  Pt will be receiving Bevucizamab and Irinotecan only today.  Pt will not get pump or Leucorvin and no injection on day 3.

## 2021-09-21 NOTE — Patient Instructions (Signed)
[facility]COL[facility]  Discharge Instructions: Thank you for choosing [facility] to provide your oncology and hematology care.   If you have a lab appointment with the [facility], please go directly to the [facility] and check in at the registration area.   Wear comfortable clothing and clothing appropriate for easy access to any Portacath or PICC line.   We strive to give you quality time with your provider. You may need to reschedule your appointment if you arrive late (15 or more minutes).  Arriving late affects you and other patients whose appointments are after yours.  Also, if you miss three or more appointments without notifying the office, you may be dismissed from the clinic at the providers discretion.      For prescription refill requests, have your pharmacy contact our office and allow 72 hours for refills to be completed.    Today you received the following chemotherapy and/or immunotherapy agents : Bevacizumab, Irinotecan      To help prevent nausea and vomiting after your treatment, we encourage you to take your nausea medication as directed.  BELOW ARE SYMPTOMS THAT SHOULD BE REPORTED IMMEDIATELY: *FEVER GREATER THAN 100.4 F (38 C) OR HIGHER *CHILLS OR SWEATING *NAUSEA AND VOMITING THAT IS NOT CONTROLLED WITH YOUR NAUSEA MEDICATION *UNUSUAL SHORTNESS OF BREATH *UNUSUAL BRUISING OR BLEEDING *URINARY PROBLEMS (pain or burning when urinating, or frequent urination) *BOWEL PROBLEMS (unusual diarrhea, constipation, pain near the anus) TENDERNESS IN MOUTH AND THROAT WITH OR WITHOUT PRESENCE OF ULCERS (sore throat, sores in mouth, or a toothache) UNUSUAL RASH, SWELLING OR PAIN  UNUSUAL VAGINAL DISCHARGE OR ITCHING   Items with * indicate a potential emergency and should be followed up as soon as possible or go to the Emergency Department if any problems should occur.  Please show the CHEMOTHERAPY ALERT CARD or IMMUNOTHERAPY ALERT CARD  at check-in to the Emergency Department and triage nurse.  Should you have questions after your visit or need to cancel or reschedule your appointment, please contact [facility]  Dept: [phone]  and follow the prompts.  Office hours are 8:00 a.m. to 4:30 p.m. Monday - Friday. Please note that voicemails left after 4:00 p.m. may not be returned until the following business day.  We are closed weekends and major holidays. You have access to a nurse at all times for urgent questions. Please call the main number to the clinic Dept: [phone] and follow the prompts.   For any non-urgent questions, you may also contact your provider using MyChart. We now offer e-Visits for anyone 40 and older to request care online for non-urgent symptoms. For details visit [email or web address].   Also download the MyChart app! Go to the app store, search "MyChart", open the app, select [facility], and log in with your MyChart username and password.  Due to Covid, a mask is required upon entering the hospital/clinic. If you do not have a mask, one will be given to you upon arrival. For doctor visits, patients may have 1 support person aged 29 or older with them. For treatment visits, patients cannot have anyone with them due to current Covid guidelines and our immunocompromised population.   Irinotecan injection What is this medication? IRINOTECAN (ir in oh TEE kan ) is a chemotherapy drug. It is used to treat colon and rectal cancer. This medicine may be used for other purposes; ask your health care provider or pharmacist if you have questions. COMMON BRAND  NAME(S): Camptosar What should I tell my care team before I take this medication? They need to know if you have any of these conditions: dehydration diarrhea infection (especially a virus infection such as chickenpox, cold sores, or herpes) liver disease low blood counts, like low white cell, platelet, or red cell  counts low levels of calcium, magnesium, or potassium in the blood recent or ongoing radiation therapy an unusual or allergic reaction to irinotecan, other medicines, foods, dyes, or preservatives pregnant or trying to get pregnant breast-feeding How should I use this medication? This drug is given as an infusion into a vein. It is administered in a hospital or clinic by a specially trained health care professional. Talk to your pediatrician regarding the use of this medicine in children. Special care may be needed. Overdosage: If you think you have taken too much of this medicine contact a poison control center or emergency room at once. NOTE: This medicine is only for you. Do not share this medicine with others. What if I miss a dose? It is important not to miss your dose. Call your doctor or health care professional if you are unable to keep an appointment. What may interact with this medication? Do not take this medicine with any of the following medications: cobicistat itraconazole This medicine may interact with the following medications: antiviral medicines for HIV or AIDS certain antibiotics like rifampin or rifabutin certain medicines for fungal infections like ketoconazole, posaconazole, and voriconazole certain medicines for seizures like carbamazepine, phenobarbital, phenotoin clarithromycin gemfibrozil nefazodone [facility]. John's Wort This list may not describe all possible interactions. Give your health care provider a list of all the medicines, herbs, non-prescription drugs, or dietary supplements you use. Also tell them if you smoke, drink alcohol, or use illegal drugs. Some items may interact with your medicine. What should I watch for while using this medication? Your condition will be monitored carefully while you are receiving this medicine. You will need important blood work done while you are taking this medicine. This drug may make you feel generally unwell. This is not  uncommon, as chemotherapy can affect healthy cells as well as cancer cells. Report any side effects. Continue your course of treatment even though you feel ill unless your doctor tells you to stop. In some cases, you may be given additional medicines to help with side effects. Follow all directions for their use. You may get drowsy or dizzy. Do not drive, use machinery, or do anything that needs mental alertness until you know how this medicine affects you. Do not stand or sit up quickly, especially if you are an older patient. This reduces the risk of dizzy or fainting spells. Call your health care professional for advice if you get a fever, chills, or sore throat, or other symptoms of a cold or flu. Do not treat yourself. This medicine decreases your body's ability to fight infections. Try to avoid being around people who are sick. Avoid taking products that contain aspirin, acetaminophen, ibuprofen, naproxen, or ketoprofen unless instructed by your doctor. These medicines may hide a fever. This medicine may increase your risk to bruise or bleed. Call your doctor or health care professional if you notice any unusual bleeding. Be careful brushing and flossing your teeth or using a toothpick because you may get an infection or bleed more easily. If you have any dental work done, tell your dentist you are receiving this medicine. Do not become pregnant while taking this medicine or for 6 months after stopping  it. Women should inform their health care professional if they wish to become pregnant or think they might be pregnant. Men should not father a child while taking this medicine and for 3 months after stopping it. There is potential for serious side effects to an unborn child. Talk to your health care professional for more information. Do not breast-feed an infant while taking this medicine or for 7 days after stopping it. This medicine has caused ovarian failure in some women. This medicine may make it  more difficult to get pregnant. Talk to your health care professional if you are concerned about your fertility. This medicine has caused decreased sperm counts in some men. This may make it more difficult to father a child. Talk to your health care professional if you are concerned about your fertility. What side effects may I notice from receiving this medication? Side effects that you should report to your doctor or health care professional as soon as possible: allergic reactions like skin rash, itching or hives, swelling of the face, lips, or tongue chest pain diarrhea flushing, runny nose, sweating during infusion low blood counts - this medicine may decrease the number of white blood cells, red blood cells and platelets. You may be at increased risk for infections and bleeding. nausea, vomiting pain, swelling, warmth in the leg signs of decreased platelets or bleeding - bruising, pinpoint red spots on the skin, black, tarry stools, blood in the urine signs of infection - fever or chills, cough, sore throat, pain or difficulty passing urine signs of decreased red blood cells - unusually weak or tired, fainting spells, lightheadedness Side effects that usually do not require medical attention (report to your doctor or health care professional if they continue or are bothersome): constipation hair loss headache loss of appetite mouth sores stomach pain This list may not describe all possible side effects. Call your doctor for medical advice about side effects. You may report side effects to FDA at 1-800-FDA-1088. Where should I keep my medication? This drug is given in a hospital or clinic and will not be stored at home. NOTE: This sheet is a summary. It may not cover all possible information. If you have questions about this medicine, talk to your doctor, pharmacist, or health care provider.  2022 Elsevier/Gold Standard (2021-04-18 00:00:00)

## 2021-09-22 ENCOUNTER — Telehealth: Payer: Self-pay

## 2021-09-22 ENCOUNTER — Telehealth: Payer: Self-pay | Admitting: Hematology

## 2021-09-22 NOTE — Telephone Encounter (Signed)
Kari Patel states that she is doing well. She is eating, drinking and urinating well. No nausea, vomiting, or diarrhea.  She is actually slightly constipated. She will try 2 tablets of senokot-S with 8 oz of water  bid and adjust dosing according to stool consistency. She knows to call [phone] if she has any questions or concerns.

## 2021-09-22 NOTE — Telephone Encounter (Signed)
Scheduled follow-up appointments per 2/9 los. Patient is aware. 

## 2021-09-22 NOTE — Telephone Encounter (Signed)
-----   Message from Scot Dock, RN sent at 09/21/2021  1:29 PM EST ----- Regarding: [name] 1st time irinotecan call on 2/10 - tol well

## 2021-09-23 ENCOUNTER — Inpatient Hospital Stay: Payer: Medicare HMO

## 2021-09-25 ENCOUNTER — Other Ambulatory Visit: Payer: Self-pay | Admitting: Hematology

## 2021-09-26 ENCOUNTER — Encounter: Payer: Self-pay | Admitting: Hematology

## 2021-09-29 ENCOUNTER — Other Ambulatory Visit: Payer: Self-pay | Admitting: Hematology

## 2021-10-02 ENCOUNTER — Telehealth: Payer: Self-pay

## 2021-10-02 NOTE — Telephone Encounter (Signed)
Kari Patel called stating she's still having severe neuropathy pain and that the gabapentin prescribed is not working.  Kari Patel stated her hands and feet are completely numb and feels like pins & needles in her hand & feet.  Kari Patel states she's using warm towels and heat on her extremities but is getting no relief.  Kari Patel stated she would like something else for her neuropathy.  Kari Patel is requesting for Dr Burr Medico or Cira Rue, NP to please contact her.

## 2021-10-04 MED FILL — Dexamethasone Sodium Phosphate Inj 100 MG/10ML: INTRAMUSCULAR | Qty: 1 | Status: AC

## 2021-10-05 ENCOUNTER — Inpatient Hospital Stay (HOSPITAL_BASED_OUTPATIENT_CLINIC_OR_DEPARTMENT_OTHER): Payer: Medicare HMO | Admitting: Hematology

## 2021-10-05 ENCOUNTER — Inpatient Hospital Stay: Payer: Medicare HMO

## 2021-10-05 ENCOUNTER — Other Ambulatory Visit: Payer: Self-pay

## 2021-10-05 ENCOUNTER — Other Ambulatory Visit: Payer: Self-pay | Admitting: *Deleted

## 2021-10-05 ENCOUNTER — Encounter: Payer: Self-pay | Admitting: Hematology

## 2021-10-05 VITALS — BP 132/62 | HR 70 | Temp 98.2°F | Resp 16 | Wt 125.5 lb

## 2021-10-05 DIAGNOSIS — D500 Iron deficiency anemia secondary to blood loss (chronic): Secondary | ICD-10-CM

## 2021-10-05 DIAGNOSIS — C182 Malignant neoplasm of ascending colon: Secondary | ICD-10-CM | POA: Diagnosis not present

## 2021-10-05 DIAGNOSIS — Z5112 Encounter for antineoplastic immunotherapy: Secondary | ICD-10-CM | POA: Diagnosis not present

## 2021-10-05 DIAGNOSIS — Z95828 Presence of other vascular implants and grafts: Secondary | ICD-10-CM

## 2021-10-05 LAB — CBC WITH DIFFERENTIAL (CANCER CENTER ONLY)
Abs Immature Granulocytes: 0 10*3/uL (ref 0.00–0.07)
Basophils Absolute: 0 10*3/uL (ref 0.0–0.1)
Basophils Relative: 1 %
Eosinophils Absolute: 0.1 10*3/uL (ref 0.0–0.5)
Eosinophils Relative: 3 %
HCT: 27.9 % — ABNORMAL LOW (ref 36.0–46.0)
Hemoglobin: 9.3 g/dL — ABNORMAL LOW (ref 12.0–15.0)
Immature Granulocytes: 0 %
Lymphocytes Relative: 22 %
Lymphs Abs: 0.7 10*3/uL (ref 0.7–4.0)
MCH: 32.1 pg (ref 26.0–34.0)
MCHC: 33.3 g/dL (ref 30.0–36.0)
MCV: 96.2 fL (ref 80.0–100.0)
Monocytes Absolute: 0.4 10*3/uL (ref 0.1–1.0)
Monocytes Relative: 13 %
Neutro Abs: 1.8 10*3/uL (ref 1.7–7.7)
Neutrophils Relative %: 61 %
Platelet Count: 173 10*3/uL (ref 150–400)
RBC: 2.9 MIL/uL — ABNORMAL LOW (ref 3.87–5.11)
RDW: 13.8 % (ref 11.5–15.5)
WBC Count: 3 10*3/uL — ABNORMAL LOW (ref 4.0–10.5)
nRBC: 0 % (ref 0.0–0.2)

## 2021-10-05 LAB — CMP (CANCER CENTER ONLY)
ALT: 5 U/L (ref 0–44)
AST: 10 U/L — ABNORMAL LOW (ref 15–41)
Albumin: 3.9 g/dL (ref 3.5–5.0)
Alkaline Phosphatase: 83 U/L (ref 38–126)
Anion gap: 7 (ref 5–15)
BUN: 8 mg/dL (ref 8–23)
CO2: 27 mmol/L (ref 22–32)
Calcium: 9.4 mg/dL (ref 8.9–10.3)
Chloride: 104 mmol/L (ref 98–111)
Creatinine: 0.57 mg/dL (ref 0.44–1.00)
GFR, Estimated: >60 mL/min (ref >60)
Glucose, Bld: 155 mg/dL — ABNORMAL HIGH (ref 70–99)
Potassium: 3.9 mmol/L (ref 3.5–5.1)
Sodium: 138 mmol/L (ref 135–145)
Total Bilirubin: 0.3 mg/dL (ref 0.3–1.2)
Total Protein: 7.6 g/dL (ref 6.5–8.1)

## 2021-10-05 LAB — IRON AND IRON BINDING CAPACITY (CC-WL,HP ONLY)
Iron: 45 ug/dL (ref 28–170)
Saturation Ratios: 17 % (ref 10.4–31.8)
TIBC: 269 ug/dL (ref 250–450)
UIBC: 224 ug/dL (ref 148–442)

## 2021-10-05 LAB — TOTAL PROTEIN, URINE DIPSTICK: Protein, ur: <30 mg/dL

## 2021-10-05 MED ORDER — HEPARIN SOD (PORK) LOCK FLUSH 100 UNIT/ML IV SOLN
500.0000 [IU] | Freq: Once | INTRAVENOUS | Status: AC | PRN
  Administered 2021-10-05: 500 [IU]

## 2021-10-05 MED ORDER — SODIUM CHLORIDE 0.9% FLUSH
10.0000 mL | Freq: Once | INTRAVENOUS | Status: AC
  Administered 2021-10-05: 10 mL

## 2021-10-05 MED ORDER — SODIUM CHLORIDE 0.9 % IV SOLN: Freq: Once | INTRAVENOUS | Status: AC

## 2021-10-05 MED ORDER — SODIUM CHLORIDE 0.9 % IV SOLN
10.0000 mg | Freq: Once | INTRAVENOUS | Status: AC
  Administered 2021-10-05: 10 mg via INTRAVENOUS
  Filled 2021-10-05: qty 10

## 2021-10-05 MED ORDER — ATROPINE SULFATE 1 MG/ML IV SOLN
0.5000 mg | Freq: Once | INTRAVENOUS | Status: AC | PRN
  Administered 2021-10-05: 0.5 mg via INTRAVENOUS
  Filled 2021-10-05: qty 1

## 2021-10-05 MED ORDER — SODIUM CHLORIDE 0.9 % IV SOLN
5.0000 mg/kg | Freq: Once | INTRAVENOUS | Status: AC
  Administered 2021-10-05: 300 mg via INTRAVENOUS
  Filled 2021-10-05: qty 12

## 2021-10-05 MED ORDER — SODIUM CHLORIDE 0.9 % IV SOLN
150.0000 mg/m2 | Freq: Once | INTRAVENOUS | Status: AC
  Administered 2021-10-05: 240 mg via INTRAVENOUS
  Filled 2021-10-05: qty 12

## 2021-10-05 MED ORDER — PALONOSETRON HCL INJECTION 0.25 MG/5ML
0.2500 mg | Freq: Once | INTRAVENOUS | Status: AC
  Administered 2021-10-05: 0.25 mg via INTRAVENOUS
  Filled 2021-10-05: qty 5

## 2021-10-05 MED ORDER — DIPHENOXYLATE-ATROPINE 2.5-0.025 MG PO TABS: 0.5000 | ORAL_TABLET | Freq: Four times a day (QID) | ORAL | 0 refills | Status: DC | PRN

## 2021-10-05 MED ORDER — SODIUM CHLORIDE 0.9% FLUSH
10.0000 mL | INTRAVENOUS | Status: DC | PRN
  Administered 2021-10-05: 10 mL

## 2021-10-05 NOTE — Progress Notes (Signed)
[facility]   [phone] [phone]   Clinic Follow up Note   Patient Care Team: Pcp, No as PCP - General [name], MD as Consulting Physician (Oncology) [name], RN as Nurse Navigator (Oncology)  Date of Service:  10/05/2021  CHIEF COMPLAINT: f/u of metastatic colon cancer  CURRENT THERAPY:  -second-line irinotecan, starting 09/21/21 -bevacizumab, q2weeks, starting 03/29/21  ASSESSMENT & PLAN:  Kari Patel is a 71 y.o. female with   1. 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 Work up with EGD and colonoscopy on 12/07/20 revealed a large cecal mass. Biopsy confirmed adenocarcinoma.  -She underwent repeat CT CAP on 03/10/21 showing: 7.1 cm cecal mass; similar extensive peritoneal/omental nodularity; similar prominent and mildly enlarged retroperitoneal and mesenteric lymph nodes; numerous scattered bilateral pulmonary nodules; similar mild right-sided hydronephrosis. -Omental biopsy 03/13/21 confirmed metastatic adenocarcinoma, consistent with colon primary.   -port placed on 03/13/21, began first line FOLFOX 03/16/21, tolerated well with bowel disturbances. Oxali eventually discontinued due to neuropathy. -FO showed MSI stable disease, low mutation burden 4 muts/Mb, K-ras positive, bevacizumab was added with C2 (03/29/21) -restaging CT CAP 09/15/21 showed mild progression in the lungs and omentum, with new 11 mm LUL nodule, and new soft tissue omental nodule.  Overall this is still better than her initial diagnosis, but slightly worse than last CT in October.  CEA normalized as of 08/2021.  -we switched her to irinotecan/beva on 09/21/21. Plan to restage after 4 cycles. She tolerated well overall with diarrhea. -Labs reviewed, anemia is stable.  Adequate to proceed with irinotecan/bevacizumab today.  No need for G-CSF at this time.   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 Anemia, iron deficiency  -Likely secondary to #1 -She received IV iron Venofer 200 mg in 03/2021 -She has constipation from iron infusion, we reviewed symptom management -Anemia is stable, no need for additional IV iron at this time   4. Goal of care discussion  -She understands the incurable nature of her stage IV cancer diagnosis  -Treatment goal is palliative, to control her disease, improve her symptoms, and prolong her life -she is full code now     PLAN -proceed with cycle 2 irinotecan/bevacizumab today -I called in lomotil  -lab, flush, f/u, and irinotecan/bevacizumab in 2 and 4 weeks   No problem-specific Assessment & Plan notes found for this encounter.   SUMMARY OF ONCOLOGIC HISTORY: Oncology History Overview Note  Cancer Staging Cancer of right colon [facility]) Staging form: Colon and Rectum - Neuroendocine Tumors, AJCC 8th Edition - Clinical stage from 12/13/2020: Stage IV (cTX, cN1, cM1) - Signed by [name], MD on 03/01/2021    Cancer of right colon [facility])  12/07/2020 Procedure   Colonoscopy  Impression: - non-bleeding internal hemorrhoids - diverticulosis in entire examined colon - one 9 mm polyp in ascending colon removed with hot snare - one 13 mm polyp in transverse colon removed with hot fnare - likely malignant partially-obstructing tumor in cecum.   12/07/2020 Pathology Results   FINAL PATHOLOGIC DIAGNOSIS  SMALL BOWEL, BIOPSIES - duodenal mucosa within normal limits GASTRIC BODY AND ATRIUM, BIOPSY - gastric antral and body-type mucosa with moderate chronic Helicobacter pylori gastritis ASCENDING COLON, POLYPECTOMY - tubular adenoma with prominent eosinophilic infiltrate CECUM, "MASS," BIOPSIES - moderately differentiated adenocarcinoma TRANSVERSE COLON, POLYPECTOMY - high grade dysplasia  involving tubulovillous adenoma - cauterized margins  negative   12/13/2020 Cancer Staging   Staging form: Colon and Rectum - Neuroendocine Tumors, AJCC 8th Edition - Clinical stage from 12/13/2020: Stage IV (cTX, cN1, cM1) - Signed by [name], MD on 03/01/2021    12/22/2020 Imaging   CT CAP  IMPRESSION: Large 7.5 cm cecal mass with surrounding nodularity/lymphadenopathy 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 The largest measures 6 mm in the RUL. Findings are nonspecific but can be followed per Fleischner criteria Hiatal hernia   03/01/2021 Initial Diagnosis   Cancer of right colon ([facility])   03/10/2021 Imaging   CT CAP  IMPRESSION: 1. Large infiltrative cecal mass measuring approximately 7.1 cm, reflecting the known right-sided colonic neoplasm. 2. Similar extensive peritoneal/omental nodularity throughout the abdomen and pelvis with trace free fluid in the pelvis along the pericolic gutters, consistent with disease involvement. 3. Similar prominent and mildly enlarged retroperitoneal and mesenteric lymph nodes, most likely reflecting disease involvement. 4. Numerous scattered bilateral pulmonary nodules measuring up to 6 mm are similar to prior and nonspecific but suspicious for disease involvement. Attention on follow-up imaging suggested. 5. Similar mild right-sided hydronephrosis without hydroureter, with the distal ureter traversing peritoneal nodularity. Likely reflecting partial UPJ obstruction given lack of hydroureter. Attention on follow-up imaging suggested. Cholelithiasis without findings of acute cholecystitis. 6.  Aortic Atherosclerosis (ICD10-I70.0).   03/13/2021 Pathology Results   FINAL MICROSCOPIC DIAGNOSIS:   A. OMENTUM, NEEDLE  CORE BIOPSY:  - Metastatic adenocarcinoma, consistent with clinical impression of a  colonic primary.  See comment    03/13/2021 Miscellaneous      03/16/2021 - 09/09/2021 Chemotherapy   Patient is on Treatment Plan : COLORECTAL FOLFOX + Bevacizumab q14d     06/05/2021 Imaging   CT CAP  IMPRESSION: Chest Impression:   1. Bilateral small pulmonary nodules appear unchanged. 2. No new nodularity. 3. No metastatic adenopathy.   Abdomen / Pelvis Impression:   1. Marked decrease in volume of circumferential mass surrounding the distal ileum leading up the terminal ileum. 2. Marked decrease in size of peritoneal implants along the ventral peritoneal surface of the lower abdomen. Persistent nodularity again much improved. 3. No evidence of liver metastasis.   09/15/2021 Imaging   CT CAP IMPRESSION: 1. Multiple scattered tiny bilateral pulmonary nodules are stable to minimally increased in size in the interval. There is a new 11 mm anterior left upper lobe pulmonary nodule on today's study. 2. Interval progression of soft tissue nodularity in the lower omentum with new disease now noted more cranially in the omentum. 3. Wall thickening in the terminal ileum appears qualitatively progressed in the interval with a string sign lumen on today's study. No features to suggest obstruction at this point. 4. Stable mild right hydronephrosis without associated hydroureter. 5. Colonic diverticulosis without diverticulitis. 6. Layering sludge or tiny stones in the gallbladder fundus. 7. Aortic Atherosclerosis (ICD10-I70.0).   09/21/2021 -  Chemotherapy   Patient is on Treatment Plan : COLORECTAL FOLFIRI / BEVACIZUMAB Q14D        INTERVAL HISTORY:  Kari Patel is here for a follow up of metastatic colon cancer. She was last seen by NP Lacie on 09/21/21. She was seen in the infusion area. She reports neuropathy in her feet. She notes it does not affect her balance much,  only initially upon standing. She  denies any falls. She reports she is now having diarrhea.   All other systems were reviewed with the patient and are negative.  MEDICAL HISTORY:  Past Medical History:  Diagnosis Date   Anemia    Colon cancer ([facility])    cecum   Heart murmur    Hypertension     SURGICAL HISTORY: Past Surgical History:  Procedure Laterality Date   ABDOMINAL HYSTERECTOMY     IR IMAGING GUIDED PORT INSERTION  03/13/2021    I have reviewed the social history and family history with the patient and they are unchanged from previous note.  ALLERGIES:  is allergic to demerol [meperidine hcl].  MEDICATIONS:  Current Outpatient Medications  Medication Sig Dispense Refill   diphenoxylate-atropine (LOMOTIL) 2.5-0.025 MG tablet Take 0.5-1 tablets by mouth 4 (four) times daily as needed for diarrhea or loose stools. 20 tablet 0   ondansetron (ZOFRAN) 8 MG tablet Take 1 tablet (8 mg total) by mouth every 8 (eight) hours as needed for nausea or vomiting. 20 tablet 1   prochlorperazine (COMPAZINE) 10 MG tablet Take 1 tablet (10 mg total) by mouth every 6 (six) hours as needed for nausea or vomiting. 30 tablet 1   amLODipine (NORVASC) 10 MG tablet TAKE 1 TABLET BY MOUTH EVERY DAY 20 tablet 0   gabapentin (NEURONTIN) 100 MG capsule Take 1 capsule (100 mg total) by mouth 2 (two) times daily. 60 capsule 1   KLOR-CON M20 20 MEQ tablet TAKE 1 TABLET BY MOUTH EVERY DAY 30 tablet 0   lidocaine-prilocaine (EMLA) cream Apply 1 application topically as needed. 30 g 4   lisinopril (ZESTRIL) 5 MG tablet TAKE 1 TABLET (5 MG TOTAL) BY MOUTH DAILY. 90 tablet 0   LORazepam (ATIVAN) 0.5 MG tablet Take 1 tablet (0.5 mg total) by mouth every 8 (eight) hours. 30 tablet 0   sucralfate (CARAFATE) 1 GM/10ML suspension Take 10 mLs (1 g total) by mouth 4 (four) times daily -  with meals and at bedtime. 420 mL 0   No current facility-administered medications for this visit.   Facility-Administered Medications Ordered in Other Visits   Medication Dose Route Frequency Provider Last Rate Last Admin   sodium chloride flush (NS) 0.9 % injection 10 mL  10 mL Intracatheter PRN [name], MD   10 mL at 10/05/21 1219    PHYSICAL EXAMINATION: ECOG PERFORMANCE STATUS: 1 - Symptomatic but completely ambulatory  There were no vitals filed for this visit. Wt Readings from Last 3 Encounters:  10/05/21 125 lb 8 oz (56.9 kg)  09/21/21 127 lb 11.2 oz (57.9 kg)  09/07/21 130 lb 14.4 oz (59.4 kg)     GENERAL:alert, no distress and comfortable SKIN: skin color normal, no rashes or significant lesions EYES: normal, Conjunctiva are pink and non-injected, sclera clear  NEURO: alert & oriented x 3 with fluent speech  LABORATORY DATA:  I have reviewed the data as listed CBC Latest Ref Rng & Units 10/05/2021 09/21/2021 09/07/2021  WBC 4.0 - 10.5 K/uL 3.0(L) 2.6(L) 6.1  Hemoglobin 12.0 - 15.0 g/dL 9.3(L) 9.8(L) 10.0(L)  Hematocrit 36.0 - 46.0 % 27.9(L) 30.1(L) 30.8(L)  Platelets 150 - 400 K/uL 173 171 162     CMP Latest Ref Rng & Units 10/05/2021 09/21/2021 09/07/2021  Glucose 70 - 99 mg/dL 155(H) 123(H) 96  BUN 8 - 23 mg/dL 8 9 6(L)  Creatinine 0.44 - 1.00 mg/dL 0.57 0.49 0.52  Sodium 135 - 145 mmol/L 138 139  141  Potassium 3.5 - 5.1 mmol/L 3.9 3.7 4.0  Chloride 98 - 111 mmol/L 104 105 106  CO2 22 - 32 mmol/L $RemoveB'27 26 28  'JuvOmLtQ$ Calcium 8.9 - 10.3 mg/dL 9.4 9.5 9.2  Total Protein 6.5 - 8.1 g/dL 7.6 7.9 7.2  Total Bilirubin 0.3 - 1.2 mg/dL 0.3 0.3 0.2(L)  Alkaline Phos 38 - 126 U/L 83 103 118  AST 15 - 41 U/L 10(L) 13(L) 11(L)  ALT 0 - 44 U/L $Remo'5 7 5      'vMCwn$ RADIOGRAPHIC STUDIES: I have personally reviewed the radiological images as listed and agreed with the findings in the report. No results found.    Orders Placed This Encounter  Procedures   Total Protein, Urine dipstick    Standing Status:   Standing    Number of Occurrences:   20    Standing Expiration Date:   10/05/2022   All questions were answered. The patient knows to call the  clinic with any problems, questions or concerns. No barriers to learning was detected. The total time spent in the appointment was 30 minutes.     [name], MD 10/05/2021   I, Wilburn Mylar, am acting as scribe for [name], MD.   I have reviewed the above documentation for accuracy and completeness, and I agree with the above.

## 2021-10-05 NOTE — Patient Instructions (Signed)
[facility]  Discharge Instructions: Thank you for choosing [facility] to provide your oncology and hematology care.   If you have a lab appointment with the [facility], please go directly to the [facility] and check in at the registration area.   Wear comfortable clothing and clothing appropriate for easy access to any Portacath or PICC line.   We strive to give you quality time with your provider. You may need to reschedule your appointment if you arrive late (15 or more minutes).  Arriving late affects you and other patients whose appointments are after yours.  Also, if you miss three or more appointments without notifying the office, you may be dismissed from the clinic at the providers discretion.      For prescription refill requests, have your pharmacy contact our office and allow 72 hours for refills to be completed.    Today you received the following chemotherapy and/or immunotherapy agents : Bevacizumab, Irinotecan      To help prevent nausea and vomiting after your treatment, we encourage you to take your nausea medication as directed.  BELOW ARE SYMPTOMS THAT SHOULD BE REPORTED IMMEDIATELY: *FEVER GREATER THAN 100.4 F (38 C) OR HIGHER *CHILLS OR SWEATING *NAUSEA AND VOMITING THAT IS NOT CONTROLLED WITH YOUR NAUSEA MEDICATION *UNUSUAL SHORTNESS OF BREATH *UNUSUAL BRUISING OR BLEEDING *URINARY PROBLEMS (pain or burning when urinating, or frequent urination) *BOWEL PROBLEMS (unusual diarrhea, constipation, pain near the anus) TENDERNESS IN MOUTH AND THROAT WITH OR WITHOUT PRESENCE OF ULCERS (sore throat, sores in mouth, or a toothache) UNUSUAL RASH, SWELLING OR PAIN  UNUSUAL VAGINAL DISCHARGE OR ITCHING   Items with * indicate a potential emergency and should be followed up as soon as possible or go to the Emergency Department if any problems should occur.  Please show the CHEMOTHERAPY ALERT CARD or IMMUNOTHERAPY ALERT CARD  at check-in to the Emergency Department and triage nurse.  Should you have questions after your visit or need to cancel or reschedule your appointment, please contact [facility]  Dept: [phone]  and follow the prompts.  Office hours are 8:00 a.m. to 4:30 p.m. Monday - Friday. Please note that voicemails left after 4:00 p.m. may not be returned until the following business day.  We are closed weekends and major holidays. You have access to a nurse at all times for urgent questions. Please call the main number to the clinic Dept: [phone] and follow the prompts.   For any non-urgent questions, you may also contact your provider using MyChart. We now offer e-Visits for anyone 102 and older to request care online for non-urgent symptoms. For details visit [email or web address].   Also download the MyChart app! Go to the app store, search "MyChart", open the app, select [facility], and log in with your MyChart username and password.  Due to Covid, a mask is required upon entering the hospital/clinic. If you do not have a mask, one will be given to you upon arrival. For doctor visits, patients may have 1 support person aged 15 or older with them. For treatment visits, patients cannot have anyone with them due to current Covid guidelines and our immunocompromised population.   Irinotecan injection What is this medication? IRINOTECAN (ir in oh TEE kan ) is a chemotherapy drug. It is used to treat colon and rectal cancer. This medicine may be used for other purposes; ask your health care provider or pharmacist if you have questions. COMMON BRAND  NAME(S): Camptosar What should I tell my care team before I take this medication? They need to know if you have any of these conditions: dehydration diarrhea infection (especially a virus infection such as chickenpox, cold sores, or herpes) liver disease low blood counts, like low white cell, platelet, or red cell  counts low levels of calcium, magnesium, or potassium in the blood recent or ongoing radiation therapy an unusual or allergic reaction to irinotecan, other medicines, foods, dyes, or preservatives pregnant or trying to get pregnant breast-feeding How should I use this medication? This drug is given as an infusion into a vein. It is administered in a hospital or clinic by a specially trained health care professional. Talk to your pediatrician regarding the use of this medicine in children. Special care may be needed. Overdosage: If you think you have taken too much of this medicine contact a poison control center or emergency room at once. NOTE: This medicine is only for you. Do not share this medicine with others. What if I miss a dose? It is important not to miss your dose. Call your doctor or health care professional if you are unable to keep an appointment. What may interact with this medication? Do not take this medicine with any of the following medications: cobicistat itraconazole This medicine may interact with the following medications: antiviral medicines for HIV or AIDS certain antibiotics like rifampin or rifabutin certain medicines for fungal infections like ketoconazole, posaconazole, and voriconazole certain medicines for seizures like carbamazepine, phenobarbital, phenotoin clarithromycin gemfibrozil nefazodone [facility]. John's Wort This list may not describe all possible interactions. Give your health care provider a list of all the medicines, herbs, non-prescription drugs, or dietary supplements you use. Also tell them if you smoke, drink alcohol, or use illegal drugs. Some items may interact with your medicine. What should I watch for while using this medication? Your condition will be monitored carefully while you are receiving this medicine. You will need important blood work done while you are taking this medicine. This drug may make you feel generally unwell. This is not  uncommon, as chemotherapy can affect healthy cells as well as cancer cells. Report any side effects. Continue your course of treatment even though you feel ill unless your doctor tells you to stop. In some cases, you may be given additional medicines to help with side effects. Follow all directions for their use. You may get drowsy or dizzy. Do not drive, use machinery, or do anything that needs mental alertness until you know how this medicine affects you. Do not stand or sit up quickly, especially if you are an older patient. This reduces the risk of dizzy or fainting spells. Call your health care professional for advice if you get a fever, chills, or sore throat, or other symptoms of a cold or flu. Do not treat yourself. This medicine decreases your body's ability to fight infections. Try to avoid being around people who are sick. Avoid taking products that contain aspirin, acetaminophen, ibuprofen, naproxen, or ketoprofen unless instructed by your doctor. These medicines may hide a fever. This medicine may increase your risk to bruise or bleed. Call your doctor or health care professional if you notice any unusual bleeding. Be careful brushing and flossing your teeth or using a toothpick because you may get an infection or bleed more easily. If you have any dental work done, tell your dentist you are receiving this medicine. Do not become pregnant while taking this medicine or for 6 months after stopping  it. Women should inform their health care professional if they wish to become pregnant or think they might be pregnant. Men should not father a child while taking this medicine and for 3 months after stopping it. There is potential for serious side effects to an unborn child. Talk to your health care professional for more information. Do not breast-feed an infant while taking this medicine or for 7 days after stopping it. This medicine has caused ovarian failure in some women. This medicine may make it  more difficult to get pregnant. Talk to your health care professional if you are concerned about your fertility. This medicine has caused decreased sperm counts in some men. This may make it more difficult to father a child. Talk to your health care professional if you are concerned about your fertility. What side effects may I notice from receiving this medication? Side effects that you should report to your doctor or health care professional as soon as possible: allergic reactions like skin rash, itching or hives, swelling of the face, lips, or tongue chest pain diarrhea flushing, runny nose, sweating during infusion low blood counts - this medicine may decrease the number of white blood cells, red blood cells and platelets. You may be at increased risk for infections and bleeding. nausea, vomiting pain, swelling, warmth in the leg signs of decreased platelets or bleeding - bruising, pinpoint red spots on the skin, black, tarry stools, blood in the urine signs of infection - fever or chills, cough, sore throat, pain or difficulty passing urine signs of decreased red blood cells - unusually weak or tired, fainting spells, lightheadedness Side effects that usually do not require medical attention (report to your doctor or health care professional if they continue or are bothersome): constipation hair loss headache loss of appetite mouth sores stomach pain This list may not describe all possible side effects. Call your doctor for medical advice about side effects. You may report side effects to FDA at 1-800-FDA-1088. Where should I keep my medication? This drug is given in a hospital or clinic and will not be stored at home. NOTE: This sheet is a summary. It may not cover all possible information. If you have questions about this medicine, talk to your doctor, pharmacist, or health care provider.  2022 Elsevier/Gold Standard (2021-04-18 00:00:00)

## 2021-10-11 ENCOUNTER — Other Ambulatory Visit (HOSPITAL_COMMUNITY): Payer: Self-pay

## 2021-10-12 ENCOUNTER — Inpatient Hospital Stay: Payer: Medicare HMO

## 2021-10-12 ENCOUNTER — Inpatient Hospital Stay: Payer: Medicare HMO | Admitting: Hematology

## 2021-10-18 ENCOUNTER — Other Ambulatory Visit: Payer: Self-pay

## 2021-10-18 DIAGNOSIS — Z95828 Presence of other vascular implants and grafts: Secondary | ICD-10-CM

## 2021-10-18 MED ORDER — LIDOCAINE-PRILOCAINE 2.5-2.5 % EX CREA: 1.0000 "application " | TOPICAL_CREAM | CUTANEOUS | 4 refills | Status: AC | PRN

## 2021-10-18 MED FILL — Dexamethasone Sodium Phosphate Inj 100 MG/10ML: INTRAMUSCULAR | Qty: 1 | Status: AC

## 2021-10-18 NOTE — Progress Notes (Unsigned)
[name]   Telephone:(336) [phone] Fax:(336) [phone]   Clinic Follow up Note   Patient Care Team: Pcp, No as PCP - General [name], MD as Consulting Physician (Oncology) [name], RN as Nurse Navigator (Oncology) 10/18/2021  CHIEF COMPLAINT: Follow-up metastatic colon cancer  SUMMARY OF ONCOLOGIC HISTORY: Oncology History Overview Note  Cancer Staging Cancer of right colon [facility]) Staging form: Colon and Rectum - Neuroendocine Tumors, AJCC 8th Edition - Clinical stage from 12/13/2020: Stage IV (cTX, cN1, cM1) - Signed by [name], MD on 03/01/2021    Cancer of right colon ([name])  12/07/2020 Procedure   Colonoscopy  Impression: - non-bleeding internal hemorrhoids - diverticulosis in entire examined colon - one 9 mm polyp in ascending colon removed with hot snare - one 13 mm polyp in transverse colon removed with hot fnare - likely malignant partially-obstructing tumor in cecum.   12/07/2020 Pathology Results   FINAL PATHOLOGIC DIAGNOSIS  SMALL BOWEL, BIOPSIES - duodenal mucosa within normal limits GASTRIC BODY AND ATRIUM, BIOPSY - gastric antral and body-type mucosa with moderate chronic Helicobacter pylori gastritis ASCENDING COLON, POLYPECTOMY - tubular adenoma with prominent eosinophilic infiltrate CECUM, "MASS," BIOPSIES - moderately differentiated adenocarcinoma TRANSVERSE COLON, POLYPECTOMY - high grade dysplasia involving tubulovillous adenoma - cauterized margins negative   12/13/2020 Cancer Staging   Staging form: Colon and Rectum - Neuroendocine Tumors, AJCC 8th Edition - Clinical stage from 12/13/2020: Stage IV (cTX, cN1, cM1) - Signed by [name], MD on 03/01/2021    12/22/2020 Imaging   CT CAP  IMPRESSION: Large 7.5 cm cecal mass with surrounding nodularity/lymphadenopathy 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 The largest measures 6 mm in the RUL. Findings are nonspecific but can be followed per Fleischner criteria Hiatal hernia   03/01/2021 Initial Diagnosis   Cancer of right colon ([facility])   03/10/2021 Imaging   CT CAP  IMPRESSION: 1. Large infiltrative cecal mass measuring approximately 7.1 cm, reflecting the known right-sided colonic neoplasm. 2. Similar extensive peritoneal/omental nodularity throughout the abdomen and pelvis with trace free fluid in the pelvis along the pericolic gutters, consistent with disease involvement. 3. Similar prominent and mildly enlarged retroperitoneal and mesenteric lymph nodes, most likely reflecting disease involvement. 4. Numerous scattered bilateral pulmonary nodules measuring up to 6 mm are similar to prior and nonspecific but suspicious for disease involvement. Attention on follow-up imaging suggested. 5. Similar mild right-sided hydronephrosis without hydroureter, with the distal ureter traversing peritoneal nodularity. Likely reflecting partial UPJ obstruction given lack of hydroureter. Attention on follow-up imaging suggested. Cholelithiasis without findings of acute cholecystitis. 6.  Aortic Atherosclerosis (ICD10-I70.0).   03/13/2021 Pathology Results   FINAL MICROSCOPIC DIAGNOSIS:   A. OMENTUM, NEEDLE CORE BIOPSY:  - Metastatic adenocarcinoma, consistent with clinical impression of a  colonic primary.  See comment    03/13/2021 Miscellaneous      03/16/2021 - 09/09/2021 Chemotherapy   Patient is on Treatment Plan : COLORECTAL FOLFOX + Bevacizumab q14d     06/05/2021 Imaging   CT CAP  IMPRESSION: Chest Impression:   1. Bilateral small pulmonary nodules appear unchanged. 2. No new nodularity. 3. No metastatic adenopathy.   Abdomen / Pelvis Impression:   1. Marked  decrease in volume of circumferential mass surrounding the distal ileum leading up the terminal ileum. 2. Marked decrease in  size of peritoneal implants along the ventral peritoneal surface of the lower abdomen. Persistent nodularity again much improved. 3. No evidence of liver metastasis.   09/15/2021 Imaging   CT CAP IMPRESSION: 1. Multiple scattered tiny bilateral pulmonary nodules are stable to minimally increased in size in the interval. There is a new 11 mm anterior left upper lobe pulmonary nodule on today's study. 2. Interval progression of soft tissue nodularity in the lower omentum with new disease now noted more cranially in the omentum. 3. Wall thickening in the terminal ileum appears qualitatively progressed in the interval with a string sign lumen on today's study. No features to suggest obstruction at this point. 4. Stable mild right hydronephrosis without associated hydroureter. 5. Colonic diverticulosis without diverticulitis. 6. Layering sludge or tiny stones in the gallbladder fundus. 7. Aortic Atherosclerosis (ICD10-I70.0).   09/21/2021 -  Chemotherapy   Patient is on Treatment Plan : COLORECTAL FOLFIRI / BEVACIZUMAB Q14D       CURRENT THERAPY:  -second-line irinotecan, starting 09/21/21 -bevacizumab, q2weeks, starting 03/29/21  INTERVAL HISTORY: [name] returns for follow-up and treatment as scheduled.  Last seen by [name] 10/05/2021 and completed cycle 2 irinotecan/Beva.   REVIEW OF SYSTEMS:   Constitutional: Denies fevers, chills or abnormal weight loss Eyes: Denies blurriness of vision Ears, nose, mouth, throat, and face: Denies mucositis or sore throat Respiratory: Denies cough, dyspnea or wheezes Cardiovascular: Denies palpitation, chest discomfort or lower extremity swelling Gastrointestinal:  Denies nausea, heartburn or change in bowel habits Skin: Denies abnormal skin rashes Lymphatics: Denies new lymphadenopathy or easy bruising Neurological:Denies  numbness, tingling or new weaknesses Behavioral/Psych: Mood is stable, no new changes  All other systems were reviewed with the patient and are negative.  MEDICAL HISTORY:  Past Medical History:  Diagnosis Date   Anemia    Colon cancer ([place])    cecum   Heart murmur    Hypertension     SURGICAL HISTORY: Past Surgical History:  Procedure Laterality Date   ABDOMINAL HYSTERECTOMY     IR IMAGING GUIDED PORT INSERTION  03/13/2021    I have reviewed the social history and family history with the patient and they are unchanged from previous note.  ALLERGIES:  is allergic to demerol [meperidine hcl].  MEDICATIONS:  Current Outpatient Medications  Medication Sig Dispense Refill   ondansetron (ZOFRAN) 8 MG tablet Take 1 tablet (8 mg total) by mouth every 8 (eight) hours as needed for nausea or vomiting. 20 tablet 1   prochlorperazine (COMPAZINE) 10 MG tablet Take 1 tablet (10 mg total) by mouth every 6 (six) hours as needed for nausea or vomiting. 30 tablet 1   amLODipine (NORVASC) 10 MG tablet TAKE 1 TABLET BY MOUTH EVERY DAY 20 tablet 0   diphenoxylate-atropine (LOMOTIL) 2.5-0.025 MG tablet Take 0.5-1 tablets by mouth 4 (four) times daily as needed for diarrhea or loose stools. 20 tablet 0   gabapentin (NEURONTIN) 100 MG capsule Take 1 capsule (100 mg total) by mouth 2 (two) times daily. 60 capsule 1   KLOR-CON M20 20 MEQ tablet TAKE 1 TABLET BY MOUTH EVERY DAY 30 tablet 0   lidocaine-prilocaine (EMLA) cream Apply 1 application. topically as needed. 30 g 4   lisinopril (ZESTRIL) 5 MG tablet TAKE 1 TABLET (5 MG TOTAL) BY MOUTH DAILY. 90 tablet 0   LORazepam (ATIVAN) 0.5 MG tablet Take 1 tablet (0.5 mg total) by mouth every 8 (eight) hours. 30 tablet 0   sucralfate (CARAFATE) 1 GM/10ML suspension Take 10 mLs (  1 g total) by mouth 4 (four) times daily -  with meals and at bedtime. 420 mL 0   No current facility-administered medications for this visit.    PHYSICAL EXAMINATION: ECOG  PERFORMANCE STATUS: {CHL ONC ECOG PS:[phone]}  There were no vitals filed for this visit. There were no vitals filed for this visit.  GENERAL:alert, no distress and comfortable SKIN: skin color, texture, turgor are normal, no rashes or significant lesions EYES: normal, Conjunctiva are pink and non-injected, sclera clear OROPHARYNX:no exudate, no erythema and lips, buccal mucosa, and tongue normal  NECK: supple, thyroid normal size, non-tender, without nodularity LYMPH:  no palpable lymphadenopathy in the cervical, axillary or inguinal LUNGS: clear to auscultation and percussion with normal breathing effort HEART: regular rate & rhythm and no murmurs and no lower extremity edema ABDOMEN:abdomen soft, non-tender and normal bowel sounds Musculoskeletal:no cyanosis of digits and no clubbing  NEURO: alert & oriented x 3 with fluent speech, no focal motor/sensory deficits  LABORATORY DATA:  I have reviewed the data as listed CBC Latest Ref Rng & Units 10/05/2021 09/21/2021 09/07/2021  WBC 4.0 - 10.5 K/uL 3.0(L) 2.6(L) 6.1  Hemoglobin 12.0 - 15.0 g/dL 9.3(L) 9.8(L) 10.0(L)  Hematocrit 36.0 - 46.0 % 27.9(L) 30.1(L) 30.8(L)  Platelets 150 - 400 K/uL 173 171 162     CMP Latest Ref Rng & Units 10/05/2021 09/21/2021 09/07/2021  Glucose 70 - 99 mg/dL 155(H) 123(H) 96  BUN 8 - 23 mg/dL 8 9 6(L)  Creatinine 0.44 - 1.00 mg/dL 0.57 0.49 0.52  Sodium 135 - 145 mmol/L 138 139 141  Potassium 3.5 - 5.1 mmol/L 3.9 3.7 4.0  Chloride 98 - 111 mmol/L 104 105 106  CO2 22 - 32 mmol/L '27 26 28  '$ Calcium 8.9 - 10.3 mg/dL 9.4 9.5 9.2  Total Protein 6.5 - 8.1 g/dL 7.6 7.9 7.2  Total Bilirubin 0.3 - 1.2 mg/dL 0.3 0.3 0.2(L)  Alkaline Phos 38 - 126 U/L 83 103 118  AST 15 - 41 U/L 10(L) 13(L) 11(L)  ALT 0 - 44 U/L '5 7 5      '$ RADIOGRAPHIC STUDIES: I have personally reviewed the radiological images as listed and agreed with the findings in the report. No results found.   ASSESSMENT & PLAN:  No  problem-specific Assessment & Plan notes found for this encounter.   No orders of the defined types were placed in this encounter.  All questions were answered. The patient knows to call the clinic with any problems, questions or concerns. No barriers to learning was detected. I spent {CHL ONC TIME VISIT - TLXBW:[id number]} counseling the patient face to face. The total time spent in the appointment was {CHL ONC TIME VISIT - ULAGT:[id number]} and more than 50% was on counseling and review of test results     Alla Feeling, NP 10/18/21

## 2021-10-19 ENCOUNTER — Inpatient Hospital Stay: Payer: Medicare HMO

## 2021-10-19 ENCOUNTER — Inpatient Hospital Stay: Payer: Medicare HMO | Admitting: Nurse Practitioner

## 2021-10-20 ENCOUNTER — Telehealth: Payer: Self-pay | Admitting: Hematology

## 2021-10-20 NOTE — Telephone Encounter (Signed)
Rescheduled past appointment per patient's request. Patient is aware of changes. 

## 2021-10-25 ENCOUNTER — Inpatient Hospital Stay: Payer: Medicare HMO | Attending: Hematology

## 2021-10-25 ENCOUNTER — Encounter: Payer: Self-pay | Admitting: Hematology

## 2021-10-25 ENCOUNTER — Inpatient Hospital Stay (HOSPITAL_BASED_OUTPATIENT_CLINIC_OR_DEPARTMENT_OTHER): Payer: Medicare HMO | Admitting: Hematology

## 2021-10-25 ENCOUNTER — Inpatient Hospital Stay: Payer: Medicare HMO

## 2021-10-25 ENCOUNTER — Other Ambulatory Visit: Payer: Self-pay

## 2021-10-25 VITALS — BP 129/68 | HR 73 | Temp 98.6°F | Resp 17 | Wt 125.2 lb

## 2021-10-25 DIAGNOSIS — D509 Iron deficiency anemia, unspecified: Secondary | ICD-10-CM | POA: Insufficient documentation

## 2021-10-25 DIAGNOSIS — Z5111 Encounter for antineoplastic chemotherapy: Secondary | ICD-10-CM | POA: Insufficient documentation

## 2021-10-25 DIAGNOSIS — G620 Drug-induced polyneuropathy: Secondary | ICD-10-CM | POA: Diagnosis not present

## 2021-10-25 DIAGNOSIS — Z5112 Encounter for antineoplastic immunotherapy: Secondary | ICD-10-CM | POA: Insufficient documentation

## 2021-10-25 DIAGNOSIS — C786 Secondary malignant neoplasm of retroperitoneum and peritoneum: Secondary | ICD-10-CM | POA: Insufficient documentation

## 2021-10-25 DIAGNOSIS — C182 Malignant neoplasm of ascending colon: Secondary | ICD-10-CM

## 2021-10-25 DIAGNOSIS — C180 Malignant neoplasm of cecum: Secondary | ICD-10-CM | POA: Diagnosis present

## 2021-10-25 DIAGNOSIS — Z95828 Presence of other vascular implants and grafts: Secondary | ICD-10-CM

## 2021-10-25 DIAGNOSIS — D500 Iron deficiency anemia secondary to blood loss (chronic): Secondary | ICD-10-CM

## 2021-10-25 LAB — CBC WITH DIFFERENTIAL (CANCER CENTER ONLY)
Abs Immature Granulocytes: 0.01 10*3/uL (ref 0.00–0.07)
Basophils Absolute: 0 10*3/uL (ref 0.0–0.1)
Basophils Relative: 1 %
Eosinophils Absolute: 0.1 10*3/uL (ref 0.0–0.5)
Eosinophils Relative: 3 %
HCT: 31.4 % — ABNORMAL LOW (ref 36.0–46.0)
Hemoglobin: 10.2 g/dL — ABNORMAL LOW (ref 12.0–15.0)
Immature Granulocytes: 0 %
Lymphocytes Relative: 22 %
Lymphs Abs: 0.8 10*3/uL (ref 0.7–4.0)
MCH: 31.2 pg (ref 26.0–34.0)
MCHC: 32.5 g/dL (ref 30.0–36.0)
MCV: 96 fL (ref 80.0–100.0)
Monocytes Absolute: 0.3 10*3/uL (ref 0.1–1.0)
Monocytes Relative: 8 %
Neutro Abs: 2.3 10*3/uL (ref 1.7–7.7)
Neutrophils Relative %: 66 %
Platelet Count: 192 10*3/uL (ref 150–400)
RBC: 3.27 MIL/uL — ABNORMAL LOW (ref 3.87–5.11)
RDW: 14.8 % (ref 11.5–15.5)
WBC Count: 3.5 10*3/uL — ABNORMAL LOW (ref 4.0–10.5)
nRBC: 0 % (ref 0.0–0.2)

## 2021-10-25 LAB — IRON AND IRON BINDING CAPACITY (CC-WL,HP ONLY)
Iron: 65 ug/dL (ref 28–170)
Saturation Ratios: 22 % (ref 10.4–31.8)
TIBC: 297 ug/dL (ref 250–450)
UIBC: 232 ug/dL (ref 148–442)

## 2021-10-25 LAB — CMP (CANCER CENTER ONLY)
ALT: 6 U/L (ref 0–44)
AST: 13 U/L — ABNORMAL LOW (ref 15–41)
Albumin: 4 g/dL (ref 3.5–5.0)
Alkaline Phosphatase: 69 U/L (ref 38–126)
Anion gap: 7 (ref 5–15)
BUN: 9 mg/dL (ref 8–23)
CO2: 26 mmol/L (ref 22–32)
Calcium: 9.5 mg/dL (ref 8.9–10.3)
Chloride: 105 mmol/L (ref 98–111)
Creatinine: 0.59 mg/dL (ref 0.44–1.00)
GFR, Estimated: >60 mL/min (ref >60)
Glucose, Bld: 142 mg/dL — ABNORMAL HIGH (ref 70–99)
Potassium: 4.1 mmol/L (ref 3.5–5.1)
Sodium: 138 mmol/L (ref 135–145)
Total Bilirubin: 0.4 mg/dL (ref 0.3–1.2)
Total Protein: 7.6 g/dL (ref 6.5–8.1)

## 2021-10-25 LAB — CEA (IN HOUSE-CHCC): CEA (CHCC-In House): 5.03 ng/mL — ABNORMAL HIGH (ref 0.00–5.00)

## 2021-10-25 LAB — TOTAL PROTEIN, URINE DIPSTICK: Protein, ur: <30 mg/dL — AB

## 2021-10-25 MED ORDER — ATROPINE SULFATE 1 MG/ML IV SOLN
0.5000 mg | Freq: Once | INTRAVENOUS | Status: AC | PRN
  Administered 2021-10-25: 0.5 mg via INTRAVENOUS
  Filled 2021-10-25: qty 1

## 2021-10-25 MED ORDER — HEPARIN SOD (PORK) LOCK FLUSH 100 UNIT/ML IV SOLN
500.0000 [IU] | Freq: Once | INTRAVENOUS | Status: AC | PRN
  Administered 2021-10-25: 500 [IU]

## 2021-10-25 MED ORDER — SODIUM CHLORIDE 0.9 % IV SOLN
5.0000 mg/kg | Freq: Once | INTRAVENOUS | Status: AC
  Administered 2021-10-25: 300 mg via INTRAVENOUS
  Filled 2021-10-25: qty 12

## 2021-10-25 MED ORDER — SODIUM CHLORIDE 0.9% FLUSH
10.0000 mL | INTRAVENOUS | Status: DC | PRN
  Administered 2021-10-25: 10 mL

## 2021-10-25 MED ORDER — SODIUM CHLORIDE 0.9 % IV SOLN
150.0000 mg/m2 | Freq: Once | INTRAVENOUS | Status: AC
  Administered 2021-10-25: 240 mg via INTRAVENOUS
  Filled 2021-10-25: qty 12

## 2021-10-25 MED ORDER — SODIUM CHLORIDE 0.9% FLUSH
10.0000 mL | Freq: Once | INTRAVENOUS | Status: AC
  Administered 2021-10-25: 10 mL

## 2021-10-25 MED ORDER — SODIUM CHLORIDE 0.9 % IV SOLN
10.0000 mg | Freq: Once | INTRAVENOUS | Status: AC
  Administered 2021-10-25: 10 mg via INTRAVENOUS
  Filled 2021-10-25: qty 10

## 2021-10-25 MED ORDER — PALONOSETRON HCL INJECTION 0.25 MG/5ML
0.2500 mg | Freq: Once | INTRAVENOUS | Status: AC
  Administered 2021-10-25: 0.25 mg via INTRAVENOUS
  Filled 2021-10-25: qty 5

## 2021-10-25 MED ORDER — SODIUM CHLORIDE 0.9 % IV SOLN: Freq: Once | INTRAVENOUS | Status: AC

## 2021-10-25 NOTE — Patient Instructions (Signed)
[facility]  Discharge Instructions: ?Thank you for choosing [facility] to provide your oncology and hematology care.  ? ?If you have a lab appointment with the [facility], please go directly to the [facility] and check in at the registration area. ?  ?Wear comfortable clothing and clothing appropriate for easy access to any Portacath or PICC line.  ? ?We strive to give you quality time with your provider. You may need to reschedule your appointment if you arrive late (15 or more minutes).  Arriving late affects you and other patients whose appointments are after yours.  Also, if you miss three or more appointments without notifying the office, you may be dismissed from the clinic at the provider?s discretion.    ?  ?For prescription refill requests, have your pharmacy contact our office and allow 72 hours for refills to be completed.   ? ?Today you received the following chemotherapy and/or immunotherapy agents: Zirabv, irinotecan    ?  ?To help prevent nausea and vomiting after your treatment, we encourage you to take your nausea medication as directed. ? ?BELOW ARE SYMPTOMS THAT SHOULD BE REPORTED IMMEDIATELY: ?*FEVER GREATER THAN 100.4 F (38 ?C) OR HIGHER ?*CHILLS OR SWEATING ?*NAUSEA AND VOMITING THAT IS NOT CONTROLLED WITH YOUR NAUSEA MEDICATION ?*UNUSUAL SHORTNESS OF BREATH ?*UNUSUAL BRUISING OR BLEEDING ?*URINARY PROBLEMS (pain or burning when urinating, or frequent urination) ?*BOWEL PROBLEMS (unusual diarrhea, constipation, pain near the anus) ?TENDERNESS IN MOUTH AND THROAT WITH OR WITHOUT PRESENCE OF ULCERS (sore throat, sores in mouth, or a toothache) ?UNUSUAL RASH, SWELLING OR PAIN  ?UNUSUAL VAGINAL DISCHARGE OR ITCHING  ? ?Items with * indicate a potential emergency and should be followed up as soon as possible or go to the Emergency Department if any problems should occur. ? ?Please show the CHEMOTHERAPY ALERT CARD or IMMUNOTHERAPY ALERT CARD at  check-in to the Emergency Department and triage nurse. ? ?Should you have questions after your visit or need to cancel or reschedule your appointment, please contact [facility]  Dept: [phone]  and follow the prompts.  Office hours are 8:00 a.m. to 4:30 p.m. Monday - Friday. Please note that voicemails left after 4:00 p.m. may not be returned until the following business day.  We are closed weekends and major holidays. You have access to a nurse at all times for urgent questions. Please call the main number to the clinic Dept: [phone] and follow the prompts. ? ? ?For any non-urgent questions, you may also contact your provider using MyChart. We now offer e-Visits for anyone 26 and older to request care online for non-urgent symptoms. For details visit [email or web address]. ?  ?Also download the MyChart app! Go to the app store, search "MyChart", open the app, select [facility], and log in with your MyChart username and password. ? ?Due to Covid, a mask is required upon entering the hospital/clinic. If you do not have a mask, one will be given to you upon arrival. For doctor visits, patients may have 1 support person aged 89 or older with them. For treatment visits, patients cannot have anyone with them due to current Covid guidelines and our immunocompromised population.  ? ?

## 2021-10-25 NOTE — Progress Notes (Signed)
?Kari Patel   ?[phone] [phone]   ?Clinic Follow up Note  ? ?Patient Care Team: ?Pcp, No as PCP - General ?[name], MD as Consulting Physician (Oncology) ?Earl Gala, Deliah Goody, RN as Art therapist (Oncology) ? ?Date of Service:  10/25/2021 ? ?CHIEF COMPLAINT: f/u of metastatic colon cancer ? ?CURRENT THERAPY:  ?-second-line irinotecan, starting 09/21/21 ?-bevacizumab, q2weeks, starting 03/29/21 ? ?ASSESSMENT & PLAN:  ?Kari Patel is a 71 y.o. female with  ? ?1. 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 Work up with EGD and colonoscopy on 12/07/20 revealed a large cecal mass. Biopsy confirmed adenocarcinoma.  ?-She underwent repeat CT CAP on 03/10/21 showing: 7.1 cm cecal mass; similar extensive peritoneal/omental nodularity; similar prominent and mildly enlarged retroperitoneal and mesenteric lymph nodes; numerous scattered bilateral pulmonary nodules; similar mild right-sided hydronephrosis. ?-Omental biopsy 03/13/21 confirmed metastatic adenocarcinoma, consistent with colon primary.   ?-port placed on 03/13/21, began first line FOLFOX 03/16/21, tolerated well with bowel disturbances. Oxali eventually discontinued due to neuropathy. ?-FO showed MSI stable disease, low mutation burden 4 muts/Mb, K-ras positive, bevacizumab was added with C2 (03/29/21) ?-restaging CT CAP 09/15/21 showed mild progression in the lungs and omentum, with new 11 mm LUL nodule, and new soft tissue omental nodule.  Overall this is still better than her initial diagnosis, but slightly worse than last CT in October.  CEA normalized as of 08/2021.  ?-we switched her to irinotecan/beva on 09/21/21. Plan to restage after 5 cycles. She tolerated well overall with intermittent diarrhea. ?-Labs reviewed, anemia is stable.  Adequate to proceed with irinotecan/bevacizumab today.  No need for G-CSF at this time. ?  ?2. Symptom Management:  Diarrhea ?-secondary to irinotecan ?-she uses imodium as needed  ?-I previously prescribed lomotil on 10/05/21 but she has not used it due to concerns of constipation  ? ?3. 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  ?4. Anemia, iron deficiency  ?-Likely secondary to #1 ?-She received IV iron Venofer 200 mg in 03/2021, developed constipation. ?-Anemia is stable, no need for additional IV iron at this time ?  ?5. Goal of care discussion  ?-She understands the incurable nature of her stage IV cancer diagnosis  ?-Treatment goal is palliative, to control her disease, improve her symptoms, and prolong her life ?-she is full code now  ?  ?  ?PLAN ?-proceed with cycle 3 irinotecan/bevacizumab today ?-lab, flush, f/u, and irinotecan/bevacizumab in 2 and 4 weeks ?  ? ? ?No problem-specific Assessment & Plan notes found for this encounter. ? ? ?SUMMARY OF ONCOLOGIC HISTORY: ?Oncology History Overview Note  ?Cancer Staging ?Cancer of right colon ([place]) ?Staging form: Colon and Rectum - Neuroendocine Tumors, AJCC 8th Edition ?- Clinical stage from 12/13/2020: Stage IV (cTX, cN1, cM1) - Signed by [name], MD on 03/01/2021 ? ?  ?Cancer of right colon [facility])  ?12/07/2020 Procedure  ? Colonoscopy ? ?Impression: ?- non-bleeding internal hemorrhoids ?- diverticulosis in entire examined colon ?- one 9 mm polyp in ascending colon removed with hot snare ?- one 13 mm polyp in transverse colon removed with hot fnare ?- likely malignant partially-obstructing tumor in cecum. ?  ?12/07/2020 Pathology Results  ? FINAL PATHOLOGIC DIAGNOSIS ? ?SMALL BOWEL, BIOPSIES ?- duodenal mucosa within normal limits ?GASTRIC BODY AND ATRIUM, BIOPSY ?- gastric antral and body-type mucosa with moderate chronic Helicobacter pylori gastritis ?  ASCENDING COLON, POLYPECTOMY ?- tubular adenoma with prominent eosinophilic infiltrate ?CECUM, "MASS," BIOPSIES ?- moderately  differentiated adenocarcinoma ?TRANSVERSE COLON, POLYPECTOMY ?- high grade dysplasia involving tubulovillous adenoma ?- cauterized margins negative ?  ?12/13/2020 Cancer Staging  ? Staging form: Colon and Rectum - Neuroendocine Tumors, AJCC 8th Edition ?- Clinical stage from 12/13/2020: Stage IV (cTX, cN1, cM1) - Signed by [name], MD on 03/01/2021 ? ?  ?12/22/2020 Imaging  ? CT CAP ? 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 The largest measures 6 mm in the RUL. Findings are nonspecific but can be followed per Fleischner criteria ?Hiatal hernia ?  ?03/01/2021 Initial Diagnosis  ? Cancer of right colon ([place]) ?  ?03/10/2021 Imaging  ? CT CAP ? ?IMPRESSION: ?1. Large infiltrative cecal mass measuring approximately 7.1 cm, ?reflecting the known right-sided colonic neoplasm. ?2. Similar extensive peritoneal/omental nodularity throughout the ?abdomen and pelvis with trace free fluid in the pelvis along the ?pericolic gutters, consistent with disease involvement. ?3. Similar prominent and mildly enlarged retroperitoneal and ?mesenteric lymph nodes, most likely reflecting disease involvement. ?4. Numerous scattered bilateral pulmonary nodules measuring up to 6 mm are similar to prior and nonspecific but suspicious for disease involvement. Attention on follow-up imaging suggested. ?5. Similar mild right-sided hydronephrosis without hydroureter, with ?the distal ureter traversing peritoneal nodularity. Likely ?reflecting partial UPJ obstruction given lack of hydroureter. ?Attention on follow-up imaging suggested. Cholelithiasis without ?findings of acute  cholecystitis. ?6.  Aortic Atherosclerosis (ICD10-I70.0). ?  ?03/13/2021 Pathology Results  ? FINAL MICROSCOPIC DIAGNOSIS:  ? ?A. OMENTUM, NEEDLE CORE BIOPSY:  ?- Metastatic adenocarcinoma, consistent with clinical impression of a  colonic primary.  See comment  ?  ?03/13/2021 Miscellaneous  ?  ?  ?03/16/2021 - 09/09/2021 Chemotherapy  ? Patient is on Treatment Plan : COLORECTAL FOLFOX + Bevacizumab q14d  ?   ?06/05/2021 Imaging  ? CT CAP ? ?IMPRESSION: ?Chest Impression: ?  ?1. Bilateral small pulmonary nodules appear unchanged. ?2. No new nodularity. ?3. No metastatic adenopathy. ?  ?Abdomen / Pelvis Impression: ?  ?1. Marked decrease in volume of circumferential mass surrounding the distal ileum leading up the terminal ileum. ?2. Marked decrease in size of peritoneal implants along the ventral ?peritoneal surface of the lower abdomen. Persistent nodularity again much improved. ?3. No evidence of liver metastasis. ?  ?09/15/2021 Imaging  ? CT CAP IMPRESSION: ?1. Multiple scattered tiny bilateral pulmonary nodules are stable to minimally increased in size in the interval. There is a new 11 mm anterior left upper lobe pulmonary nodule on today's study. ?2. Interval progression of soft tissue nodularity in the lower omentum with new disease now noted more cranially in the omentum. ?3. Wall thickening in the terminal ileum appears qualitatively progressed in the interval with a string sign lumen on today's study. No features to suggest obstruction at this point. ?4. Stable mild right hydronephrosis without associated hydroureter. ?5. Colonic diverticulosis without diverticulitis. ?6. Layering sludge or tiny stones in the gallbladder fundus. ?7. Aortic Atherosclerosis (ICD10-I70.0). ?  ?09/21/2021 -  Chemotherapy  ? Patient is on Treatment Plan : COLORECTAL FOLFIRI / BEVACIZUMAB Q14D  ?   ? ? ? ?INTERVAL HISTORY:  ?Kari Patel is here for a follow up of metastatic colon cancer. She was last seen by me on  10/05/21. She was seen in  the infusion area. ?She reports she is having diarrhea and vomiting. ?She also reports tingling in her hands and feet, more so in her feet. ?  ?All other systems were reviewed with the patient and are negative. ?

## 2021-10-26 ENCOUNTER — Other Ambulatory Visit: Payer: Self-pay | Admitting: Nurse Practitioner

## 2021-10-27 ENCOUNTER — Telehealth: Payer: Self-pay | Admitting: *Deleted

## 2021-10-27 NOTE — Telephone Encounter (Signed)
-----   Message from Alla Feeling, NP sent at 10/26/2021 11:22 AM EDT ----- ?Please let pt know CEA is slightly up >5, very mild elevation. We will repeat it at next visit to monitor this closely. No change to the current plan now. I spoke to [name] and she agrees. Let us know if she has new concerns/questions.  ? ?Thanks, ?Regan Rakers, NP ?

## 2021-10-27 NOTE — Telephone Encounter (Signed)
Per Regan Rakers Burton,NP called pt with message below. Advised pt to call office with concerns. Pt had complaints of diarrhea and had already taken imodium. Message was sent to provider to refill Lomotil. Pt had no other concerns and verbalized understanding.  ?

## 2021-10-30 ENCOUNTER — Other Ambulatory Visit: Payer: Self-pay | Admitting: Hematology

## 2021-11-02 ENCOUNTER — Inpatient Hospital Stay: Payer: Medicare HMO

## 2021-11-02 ENCOUNTER — Inpatient Hospital Stay: Payer: Medicare HMO | Admitting: Hematology

## 2021-11-07 ENCOUNTER — Other Ambulatory Visit: Payer: Self-pay | Admitting: Nurse Practitioner

## 2021-11-07 MED ORDER — DIPHENOXYLATE-ATROPINE 2.5-0.025 MG PO TABS: 0.5000 | ORAL_TABLET | Freq: Four times a day (QID) | ORAL | 1 refills | Status: DC | PRN

## 2021-11-10 ENCOUNTER — Inpatient Hospital Stay: Payer: Medicare HMO

## 2021-11-10 ENCOUNTER — Inpatient Hospital Stay: Payer: Medicare HMO | Admitting: Hematology

## 2021-11-10 ENCOUNTER — Other Ambulatory Visit: Payer: Self-pay

## 2021-11-10 ENCOUNTER — Encounter: Payer: Self-pay | Admitting: Hematology

## 2021-11-10 VITALS — BP 132/65 | HR 80 | Temp 98.8°F | Resp 16 | Ht 69.0 in | Wt 125.0 lb

## 2021-11-10 VITALS — BP 111/58 | HR 65 | Temp 98.6°F

## 2021-11-10 DIAGNOSIS — Z5112 Encounter for antineoplastic immunotherapy: Secondary | ICD-10-CM | POA: Diagnosis not present

## 2021-11-10 DIAGNOSIS — Z95828 Presence of other vascular implants and grafts: Secondary | ICD-10-CM

## 2021-11-10 DIAGNOSIS — C182 Malignant neoplasm of ascending colon: Secondary | ICD-10-CM

## 2021-11-10 DIAGNOSIS — D500 Iron deficiency anemia secondary to blood loss (chronic): Secondary | ICD-10-CM

## 2021-11-10 LAB — CBC WITH DIFFERENTIAL (CANCER CENTER ONLY)
Abs Immature Granulocytes: 0 10*3/uL (ref 0.00–0.07)
Basophils Absolute: 0 10*3/uL (ref 0.0–0.1)
Basophils Relative: 1 %
Eosinophils Absolute: 0.1 10*3/uL (ref 0.0–0.5)
Eosinophils Relative: 3 %
HCT: 32.3 % — ABNORMAL LOW (ref 36.0–46.0)
Hemoglobin: 10.4 g/dL — ABNORMAL LOW (ref 12.0–15.0)
Immature Granulocytes: 0 %
Lymphocytes Relative: 24 %
Lymphs Abs: 0.7 10*3/uL (ref 0.7–4.0)
MCH: 30.9 pg (ref 26.0–34.0)
MCHC: 32.2 g/dL (ref 30.0–36.0)
MCV: 95.8 fL (ref 80.0–100.0)
Monocytes Absolute: 0.2 10*3/uL (ref 0.1–1.0)
Monocytes Relative: 9 %
Neutro Abs: 1.7 10*3/uL (ref 1.7–7.7)
Neutrophils Relative %: 63 %
Platelet Count: 208 10*3/uL (ref 150–400)
RBC: 3.37 MIL/uL — ABNORMAL LOW (ref 3.87–5.11)
RDW: 14.9 % (ref 11.5–15.5)
WBC Count: 2.7 10*3/uL — ABNORMAL LOW (ref 4.0–10.5)
nRBC: 0 % (ref 0.0–0.2)

## 2021-11-10 LAB — CMP (CANCER CENTER ONLY)
ALT: 5 U/L (ref 0–44)
AST: 10 U/L — ABNORMAL LOW (ref 15–41)
Albumin: 4 g/dL (ref 3.5–5.0)
Alkaline Phosphatase: 70 U/L (ref 38–126)
Anion gap: 7 (ref 5–15)
BUN: 6 mg/dL — ABNORMAL LOW (ref 8–23)
CO2: 25 mmol/L (ref 22–32)
Calcium: 9.4 mg/dL (ref 8.9–10.3)
Chloride: 106 mmol/L (ref 98–111)
Creatinine: 0.52 mg/dL (ref 0.44–1.00)
GFR, Estimated: >60 mL/min (ref >60)
Glucose, Bld: 117 mg/dL — ABNORMAL HIGH (ref 70–99)
Potassium: 3.9 mmol/L (ref 3.5–5.1)
Sodium: 138 mmol/L (ref 135–145)
Total Bilirubin: 0.4 mg/dL (ref 0.3–1.2)
Total Protein: 7.5 g/dL (ref 6.5–8.1)

## 2021-11-10 LAB — IRON AND IRON BINDING CAPACITY (CC-WL,HP ONLY)
Iron: 46 ug/dL (ref 28–170)
Saturation Ratios: 15 % (ref 10.4–31.8)
TIBC: 311 ug/dL (ref 250–450)
UIBC: 265 ug/dL (ref 148–442)

## 2021-11-10 LAB — CEA (IN HOUSE-CHCC): CEA (CHCC-In House): 5.5 ng/mL — ABNORMAL HIGH (ref 0.00–5.00)

## 2021-11-10 MED ORDER — SODIUM CHLORIDE 0.9 % IV SOLN: Freq: Once | INTRAVENOUS | Status: AC

## 2021-11-10 MED ORDER — PALONOSETRON HCL INJECTION 0.25 MG/5ML
0.2500 mg | Freq: Once | INTRAVENOUS | Status: AC
  Administered 2021-11-10: 0.25 mg via INTRAVENOUS
  Filled 2021-11-10: qty 5

## 2021-11-10 MED ORDER — SODIUM CHLORIDE 0.9 % IV SOLN
5.0000 mg/kg | Freq: Once | INTRAVENOUS | Status: AC
  Administered 2021-11-10: 300 mg via INTRAVENOUS
  Filled 2021-11-10: qty 12

## 2021-11-10 MED ORDER — GABAPENTIN 100 MG PO CAPS: 100.0000 mg | ORAL_CAPSULE | Freq: Two times a day (BID) | ORAL | 1 refills | Status: DC

## 2021-11-10 MED ORDER — SODIUM CHLORIDE 0.9 % IV SOLN
10.0000 mg | Freq: Once | INTRAVENOUS | Status: AC
  Administered 2021-11-10: 10 mg via INTRAVENOUS
  Filled 2021-11-10: qty 10

## 2021-11-10 MED ORDER — SODIUM CHLORIDE 0.9 % IV SOLN
150.0000 mg/m2 | Freq: Once | INTRAVENOUS | Status: AC
  Administered 2021-11-10: 240 mg via INTRAVENOUS
  Filled 2021-11-10: qty 12

## 2021-11-10 MED ORDER — SODIUM CHLORIDE 0.9% FLUSH
10.0000 mL | Freq: Once | INTRAVENOUS | Status: AC
  Administered 2021-11-10: 10 mL

## 2021-11-10 MED ORDER — ATROPINE SULFATE 1 MG/ML IV SOLN
0.5000 mg | Freq: Once | INTRAVENOUS | Status: AC | PRN
  Administered 2021-11-10: 0.5 mg via INTRAVENOUS
  Filled 2021-11-10: qty 1

## 2021-11-10 NOTE — Progress Notes (Signed)
Treatment given per orders. Patient tolerated it well without problems. Vitals stable and discharged home from clinic ambulatory. Follow up as scheduled.  

## 2021-11-10 NOTE — Patient Instructions (Signed)
[facility]  Discharge Instructions: Thank you for choosing [facility] to provide your oncology and hematology care.   If you have a lab appointment with the Cancer Center, please go directly to the Cancer Center and check in at the registration area.   Wear comfortable clothing and clothing appropriate for easy access to any Portacath or PICC line.   We strive to give you quality time with your provider. You may need to reschedule your appointment if you arrive late (15 or more minutes).  Arriving late affects you and other patients whose appointments are after yours.  Also, if you miss three or more appointments without notifying the office, you may be dismissed from the clinic at the provider's discretion.      For prescription refill requests, have your pharmacy contact our office and allow 72 hours for refills to be completed.       To help prevent nausea and vomiting after your treatment, we encourage you to take your nausea medication as directed.  BELOW ARE SYMPTOMS THAT SHOULD BE REPORTED IMMEDIATELY: *FEVER GREATER THAN 100.4 F (38 C) OR HIGHER *CHILLS OR SWEATING *NAUSEA AND VOMITING THAT IS NOT CONTROLLED WITH YOUR NAUSEA MEDICATION *UNUSUAL SHORTNESS OF BREATH *UNUSUAL BRUISING OR BLEEDING *URINARY PROBLEMS (pain or burning when urinating, or frequent urination) *BOWEL PROBLEMS (unusual diarrhea, constipation, pain near the anus) TENDERNESS IN MOUTH AND THROAT WITH OR WITHOUT PRESENCE OF ULCERS (sore throat, sores in mouth, or a toothache) UNUSUAL RASH, SWELLING OR PAIN  UNUSUAL VAGINAL DISCHARGE OR ITCHING   Items with * indicate a potential emergency and should be followed up as soon as possible or go to the Emergency Department if any problems should occur.  Please show the CHEMOTHERAPY ALERT CARD or IMMUNOTHERAPY ALERT CARD at check-in to the Emergency Department and triage nurse.  Should you have questions after your  visit or need to cancel or reschedule your appointment, please contact [facility]  Dept: [phone]  and follow the prompts.  Office hours are 8:00 a.m. to 4:30 p.m. Monday - Friday. Please note that voicemails left after 4:00 p.m. may not be returned until the following business day.  We are closed weekends and major holidays. You have access to a nurse at all times for urgent questions. Please call the main number to the clinic Dept: [phone] and follow the prompts.   For any non-urgent questions, you may also contact your provider using MyChart. We now offer e-Visits for anyone 18 and older to request care online for non-urgent symptoms. For details visit [email or web address].   Also download the MyChart app! Go to the app store, search "MyChart", open the app, select [facility], and log in with your MyChart username and password.  Due to Covid, a mask is required upon entering the hospital/clinic. If you do not have a mask, one will be given to you upon arrival. For doctor visits, patients may have 1 support person aged 18 or older with them. For treatment visits, patients cannot have anyone with them due to current Covid guidelines and our immunocompromised population.   

## 2021-11-10 NOTE — Progress Notes (Signed)
?[place]   ?[phone] [phone]   ?Clinic Follow up Note  ? ?Patient Care Team: ?Pcp, No as PCP - General ?[name], MD as Consulting Physician (Oncology) ?Earl Gala, Deliah Goody, RN as Art therapist (Oncology) ? ?Date of Service:  11/10/2021 ? ?CHIEF COMPLAINT: f/u of metastatic colon cancer ? ?CURRENT THERAPY:  ?-second-line irinotecan, starting 09/21/21 ?-bevacizumab, q2weeks, starting 03/29/21 ? ?ASSESSMENT & PLAN:  ?Kari Patel is a 71 y.o. female with  ? ?1. 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 Work up with EGD and colonoscopy on 12/07/20 revealed a large cecal mass. Biopsy confirmed adenocarcinoma.  ?-She underwent repeat CT CAP on 03/10/21 showing: 7.1 cm cecal mass; similar extensive peritoneal/omental nodularity; similar prominent and mildly enlarged retroperitoneal and mesenteric lymph nodes; numerous scattered bilateral pulmonary nodules; similar mild right-sided hydronephrosis. ?-Omental biopsy 03/13/21 confirmed metastatic adenocarcinoma, consistent with colon primary.   ?-port placed on 03/13/21, began first line FOLFOX 03/16/21, tolerated well with bowel disturbances. Oxali eventually discontinued due to neuropathy. ?-FO showed MSI stable disease, low mutation burden 4 muts/Mb, K-ras positive, bevacizumab was added with C2 (03/29/21) ?-restaging CT CAP 09/15/21 showed mild progression in the lungs and omentum, with new 11 mm LUL nodule, and new soft tissue omental nodule.  Overall this is still better than her initial diagnosis, but slightly worse than last CT in October.  CEA normalized as of 08/2021.  ?-we switched her to irinotecan/beva on 09/21/21. Plan to restage after 5 cycles. She tolerated well overall with intermittent diarrhea. ?-Labs reviewed, anemia is stable.  Adequate to proceed with irinotecan/bevacizumab today.  No need for G-CSF at this time. She would like to take a week  break after this cycle. I am happy to give her a break; we will delay her next cycle. ?-I ordered restaging CT to be done after next cycle. ?  ?2. Symptom Management: Diarrhea ?-secondary to irinotecan ?-I previously prescribed lomotil on 10/05/21 but she has not used it due to concerns of constipation. I advised her to start at low dose. ?  ?3. 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 ?  ?4. Anemia, iron deficiency  ?-Likely secondary to #1 ?-She received IV iron Venofer 200 mg in 03/2021, developed constipation. ?-Anemia is stable, no need for additional IV iron at this time ?  ?5. Goal of care discussion  ?-She understands the incurable nature of her stage IV cancer diagnosis  ?-Treatment goal is palliative, to control her disease, improve her symptoms, and prolong her life ?-she is full code now  ?  ?  ?PLAN ?-proceed with cycle 4 irinotecan/bevacizumab today ?-lab, flush, f/u, and irinotecan/bevacizumab in 3 (with one extra week chemo break) and 5 weeks ?-restaging CT to be done in 4-5 weeks ? ? ?No problem-specific Assessment & Plan notes found for this encounter. ? ? ?SUMMARY OF ONCOLOGIC HISTORY: ?Oncology History Overview Note  ?Cancer Staging ?Cancer of right colon ([place]) ?Staging form: Colon and Rectum - Neuroendocine Tumors, AJCC 8th Edition ?- Clinical stage from 12/13/2020: Stage IV (cTX, cN1, cM1) - Signed by [name], MD on 03/01/2021 ? ?  ?Cancer of right colon [facility])  ?12/07/2020 Procedure  ? Colonoscopy ? ?Impression: ?- non-bleeding internal hemorrhoids ?- diverticulosis in entire examined colon ?- one 9 mm  polyp in ascending colon removed with hot snare ?- one 13 mm polyp in transverse colon removed with hot fnare ?- likely malignant partially-obstructing tumor in cecum. ?  ?12/07/2020 Pathology Results  ? FINAL PATHOLOGIC  DIAGNOSIS ? ?SMALL BOWEL, BIOPSIES ?- duodenal mucosa within normal limits ?GASTRIC BODY AND ATRIUM, BIOPSY ?- gastric antral and body-type mucosa with moderate chronic Helicobacter pylori gastritis ?ASCENDING COLON, POLYPECTOMY ?- tubular adenoma with prominent eosinophilic infiltrate ?CECUM, "MASS," BIOPSIES ?- moderately differentiated adenocarcinoma ?TRANSVERSE COLON, POLYPECTOMY ?- high grade dysplasia involving tubulovillous adenoma ?- cauterized margins negative ?  ?12/13/2020 Cancer Staging  ? Staging form: Colon and Rectum - Neuroendocine Tumors, AJCC 8th Edition ?- Clinical stage from 12/13/2020: Stage IV (cTX, cN1, cM1) - Signed by [name], MD on 03/01/2021 ? ?  ?12/22/2020 Imaging  ? CT CAP ? 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 The largest measures 6 mm in the RUL. Findings are nonspecific but can be followed per Fleischner criteria ?Hiatal hernia ?  ?03/01/2021 Initial Diagnosis  ? Cancer of right colon ([place]) ?  ?03/10/2021 Imaging  ? CT CAP ? ?IMPRESSION: ?1. Large infiltrative cecal mass measuring approximately 7.1 cm, ?reflecting the known right-sided colonic neoplasm. ?2. Similar extensive peritoneal/omental nodularity throughout the ?abdomen and pelvis with trace free fluid in the pelvis along the ?pericolic gutters, consistent with disease involvement. ?3. Similar prominent and mildly enlarged retroperitoneal and ?mesenteric lymph nodes, most likely reflecting disease involvement. ?4. Numerous scattered bilateral pulmonary nodules measuring up to 6 mm are similar to prior and nonspecific but suspicious for disease involvement.  Attention on follow-up imaging suggested. ?5. Similar mild right-sided hydronephrosis without hydroureter, with ?the distal ureter traversing peritoneal nodularity. Likely ?reflecting partial UPJ obstruction given lack of hydroureter. ?Attention on follow-up imaging suggested. Cholelithiasis without ?findings of acute cholecystitis. ?6.  Aortic Atherosclerosis (ICD10-I70.0). ?  ?03/13/2021 Pathology Results  ? FINAL MICROSCOPIC DIAGNOSIS:  ? ?A. OMENTUM, NEEDLE CORE BIOPSY:  ?- Metastatic adenocarcinoma, consistent with clinical impression of a  colonic primary.  See comment  ?  ?03/13/2021 Miscellaneous  ?  ?  ?03/16/2021 - 09/09/2021 Chemotherapy  ? Patient is on Treatment Plan : COLORECTAL FOLFOX + Bevacizumab q14d  ?   ?06/05/2021 Imaging  ? CT CAP ? ?IMPRESSION: ?Chest Impression: ?  ?1. Bilateral small pulmonary nodules appear unchanged. ?2. No new nodularity. ?3. No metastatic adenopathy. ?  ?Abdomen / Pelvis Impression: ?  ?1. Marked decrease in volume of circumferential mass surrounding the distal ileum leading up the terminal ileum. ?2. Marked decrease in size of peritoneal implants along the ventral ?peritoneal surface of the lower abdomen. Persistent nodularity again much improved. ?3. No evidence of liver metastasis. ?  ?09/15/2021 Imaging  ? CT CAP IMPRESSION: ?1. Multiple scattered tiny bilateral pulmonary nodules are stable to minimally increased in size in the interval. There is a new 11 mm anterior left upper lobe pulmonary nodule on today's study. ?2. Interval progression of soft tissue nodularity in the lower omentum with new disease now noted more cranially in the omentum. ?3. Wall thickening in the terminal ileum appears qualitatively progressed in the interval with a string sign lumen on today's study. No features to suggest obstruction at this point. ?4. Stable mild right hydronephrosis without associated hydroureter. ?5. Colonic diverticulosis  without diverticulitis. ?6. Layering sludge or tiny  stones in the gallbladder fundus. ?7. Aortic Atherosclerosis (ICD10-I70.0). ?  ?09/21/2021 -  Chemotherapy  ? Patient is on Treatment Plan : COLORECTAL FOLFIRI / BEVACIZUMAB Q14D  ?   ? ? ? ?INTERVAL HISTORY:  ?Kari Patel

## 2021-11-12 ENCOUNTER — Other Ambulatory Visit: Payer: Self-pay | Admitting: Hematology

## 2021-11-14 ENCOUNTER — Telehealth: Payer: Self-pay | Admitting: Hematology

## 2021-11-14 NOTE — Telephone Encounter (Signed)
Rescheduled upcoming appointment per 3/31 los. Patient is aware of changes. ?

## 2021-11-14 NOTE — Telephone Encounter (Signed)
Scheduled follow-up appointment per 3/31 los. Patient is aware. ?

## 2021-11-17 ENCOUNTER — Inpatient Hospital Stay: Payer: Medicare HMO | Admitting: Hematology

## 2021-11-17 ENCOUNTER — Inpatient Hospital Stay: Payer: Medicare HMO

## 2021-11-24 ENCOUNTER — Other Ambulatory Visit: Payer: Self-pay | Admitting: Hematology

## 2021-11-24 ENCOUNTER — Telehealth: Payer: Self-pay | Admitting: *Deleted

## 2021-11-24 ENCOUNTER — Inpatient Hospital Stay: Payer: Medicare HMO | Attending: Hematology

## 2021-11-24 ENCOUNTER — Other Ambulatory Visit: Payer: Self-pay | Admitting: *Deleted

## 2021-11-24 ENCOUNTER — Inpatient Hospital Stay: Payer: Medicare HMO

## 2021-11-24 ENCOUNTER — Inpatient Hospital Stay (HOSPITAL_BASED_OUTPATIENT_CLINIC_OR_DEPARTMENT_OTHER): Payer: Medicare HMO | Admitting: Physician Assistant

## 2021-11-24 ENCOUNTER — Other Ambulatory Visit: Payer: Self-pay

## 2021-11-24 ENCOUNTER — Other Ambulatory Visit: Payer: Self-pay | Admitting: Physician Assistant

## 2021-11-24 VITALS — BP 150/64 | HR 66 | Temp 97.9°F | Resp 16 | Ht 69.0 in | Wt 123.2 lb

## 2021-11-24 DIAGNOSIS — Z5111 Encounter for antineoplastic chemotherapy: Secondary | ICD-10-CM | POA: Insufficient documentation

## 2021-11-24 DIAGNOSIS — C189 Malignant neoplasm of colon, unspecified: Secondary | ICD-10-CM

## 2021-11-24 DIAGNOSIS — Z5112 Encounter for antineoplastic immunotherapy: Secondary | ICD-10-CM | POA: Diagnosis present

## 2021-11-24 DIAGNOSIS — D709 Neutropenia, unspecified: Secondary | ICD-10-CM | POA: Insufficient documentation

## 2021-11-24 DIAGNOSIS — C182 Malignant neoplasm of ascending colon: Secondary | ICD-10-CM

## 2021-11-24 DIAGNOSIS — K5730 Diverticulosis of large intestine without perforation or abscess without bleeding: Secondary | ICD-10-CM | POA: Diagnosis not present

## 2021-11-24 DIAGNOSIS — D500 Iron deficiency anemia secondary to blood loss (chronic): Secondary | ICD-10-CM

## 2021-11-24 DIAGNOSIS — C786 Secondary malignant neoplasm of retroperitoneum and peritoneum: Secondary | ICD-10-CM | POA: Diagnosis not present

## 2021-11-24 DIAGNOSIS — G620 Drug-induced polyneuropathy: Secondary | ICD-10-CM | POA: Diagnosis not present

## 2021-11-24 DIAGNOSIS — G629 Polyneuropathy, unspecified: Secondary | ICD-10-CM | POA: Diagnosis not present

## 2021-11-24 DIAGNOSIS — C180 Malignant neoplasm of cecum: Secondary | ICD-10-CM | POA: Diagnosis present

## 2021-11-24 DIAGNOSIS — Z95828 Presence of other vascular implants and grafts: Secondary | ICD-10-CM

## 2021-11-24 DIAGNOSIS — D509 Iron deficiency anemia, unspecified: Secondary | ICD-10-CM | POA: Insufficient documentation

## 2021-11-24 LAB — CMP (CANCER CENTER ONLY)
ALT: 6 U/L (ref 0–44)
AST: 12 U/L — ABNORMAL LOW (ref 15–41)
Albumin: 3.9 g/dL (ref 3.5–5.0)
Alkaline Phosphatase: 68 U/L (ref 38–126)
Anion gap: 7 (ref 5–15)
BUN: 6 mg/dL — ABNORMAL LOW (ref 8–23)
CO2: 27 mmol/L (ref 22–32)
Calcium: 9.3 mg/dL (ref 8.9–10.3)
Chloride: 106 mmol/L (ref 98–111)
Creatinine: 0.55 mg/dL (ref 0.44–1.00)
GFR, Estimated: >60 mL/min (ref >60)
Glucose, Bld: 117 mg/dL — ABNORMAL HIGH (ref 70–99)
Potassium: 3.7 mmol/L (ref 3.5–5.1)
Sodium: 140 mmol/L (ref 135–145)
Total Bilirubin: 0.4 mg/dL (ref 0.3–1.2)
Total Protein: 7.2 g/dL (ref 6.5–8.1)

## 2021-11-24 LAB — CBC WITH DIFFERENTIAL (CANCER CENTER ONLY)
Abs Immature Granulocytes: 0 10*3/uL (ref 0.00–0.07)
Basophils Absolute: 0 10*3/uL (ref 0.0–0.1)
Basophils Relative: 0 %
Eosinophils Absolute: 0.1 10*3/uL (ref 0.0–0.5)
Eosinophils Relative: 4 %
HCT: 31.6 % — ABNORMAL LOW (ref 36.0–46.0)
Hemoglobin: 10.3 g/dL — ABNORMAL LOW (ref 12.0–15.0)
Immature Granulocytes: 0 %
Lymphocytes Relative: 25 %
Lymphs Abs: 0.6 10*3/uL — ABNORMAL LOW (ref 0.7–4.0)
MCH: 31.7 pg (ref 26.0–34.0)
MCHC: 32.6 g/dL (ref 30.0–36.0)
MCV: 97.2 fL (ref 80.0–100.0)
Monocytes Absolute: 0.3 10*3/uL (ref 0.1–1.0)
Monocytes Relative: 11 %
Neutro Abs: 1.4 10*3/uL — ABNORMAL LOW (ref 1.7–7.7)
Neutrophils Relative %: 60 %
Platelet Count: 151 10*3/uL (ref 150–400)
RBC: 3.25 MIL/uL — ABNORMAL LOW (ref 3.87–5.11)
RDW: 15.5 % (ref 11.5–15.5)
WBC Count: 2.3 10*3/uL — ABNORMAL LOW (ref 4.0–10.5)
nRBC: 0 % (ref 0.0–0.2)

## 2021-11-24 LAB — IRON AND IRON BINDING CAPACITY (CC-WL,HP ONLY)
Iron: 52 ug/dL (ref 28–170)
Saturation Ratios: 17 % (ref 10.4–31.8)
TIBC: 309 ug/dL (ref 250–450)
UIBC: 257 ug/dL (ref 148–442)

## 2021-11-24 LAB — CEA (IN HOUSE-CHCC): CEA (CHCC-In House): 6.13 ng/mL — ABNORMAL HIGH (ref 0.00–5.00)

## 2021-11-24 LAB — FERRITIN: Ferritin: 119 ng/mL (ref 11–307)

## 2021-11-24 LAB — TOTAL PROTEIN, URINE DIPSTICK: Protein, ur: 30 mg/dL — AB

## 2021-11-24 MED ORDER — PALONOSETRON HCL INJECTION 0.25 MG/5ML
0.2500 mg | Freq: Once | INTRAVENOUS | Status: AC
  Administered 2021-11-24: 0.25 mg via INTRAVENOUS
  Filled 2021-11-24: qty 5

## 2021-11-24 MED ORDER — HEPARIN SOD (PORK) LOCK FLUSH 100 UNIT/ML IV SOLN
500.0000 [IU] | Freq: Once | INTRAVENOUS | Status: AC | PRN
  Administered 2021-11-24: 500 [IU]

## 2021-11-24 MED ORDER — PREGABALIN 50 MG PO CAPS: 50.0000 mg | ORAL_CAPSULE | Freq: Two times a day (BID) | ORAL | 0 refills | Status: DC

## 2021-11-24 MED ORDER — ATROPINE SULFATE 1 MG/ML IV SOLN
0.5000 mg | Freq: Once | INTRAVENOUS | Status: AC | PRN
  Administered 2021-11-24: 0.5 mg via INTRAVENOUS
  Filled 2021-11-24: qty 1

## 2021-11-24 MED ORDER — SODIUM CHLORIDE 0.9 % IV SOLN
5.0000 mg/kg | Freq: Once | INTRAVENOUS | Status: AC
  Administered 2021-11-24: 300 mg via INTRAVENOUS
  Filled 2021-11-24: qty 12

## 2021-11-24 MED ORDER — SODIUM CHLORIDE 0.9 % IV SOLN
150.0000 mg/m2 | Freq: Once | INTRAVENOUS | Status: AC
  Administered 2021-11-24: 240 mg via INTRAVENOUS
  Filled 2021-11-24: qty 12

## 2021-11-24 MED ORDER — ONDANSETRON HCL 8 MG PO TABS: 8.0000 mg | ORAL_TABLET | Freq: Three times a day (TID) | ORAL | 1 refills | Status: DC | PRN

## 2021-11-24 MED ORDER — SUCRALFATE 1 G PO TABS: ORAL_TABLET | ORAL | 0 refills | Status: DC

## 2021-11-24 MED ORDER — PREGABALIN 50 MG PO CAPS
50.0000 mg | ORAL_CAPSULE | Freq: Two times a day (BID) | ORAL | 0 refills | Status: DC
Start: 2021-11-24 — End: 2021-12-12

## 2021-11-24 MED ORDER — SODIUM CHLORIDE 0.9 % IV SOLN: Freq: Once | INTRAVENOUS | Status: AC

## 2021-11-24 MED ORDER — SODIUM CHLORIDE 0.9% FLUSH
10.0000 mL | Freq: Once | INTRAVENOUS | Status: AC
  Administered 2021-11-24: 10 mL

## 2021-11-24 MED ORDER — AMLODIPINE BESYLATE 10 MG PO TABS: 10.0000 mg | ORAL_TABLET | Freq: Every day | ORAL | 0 refills | Status: DC

## 2021-11-24 MED ORDER — LORAZEPAM 0.5 MG PO TABS: 0.5000 mg | ORAL_TABLET | Freq: Three times a day (TID) | ORAL | 0 refills | Status: DC

## 2021-11-24 MED ORDER — SODIUM CHLORIDE 0.9% FLUSH
10.0000 mL | INTRAVENOUS | Status: DC | PRN
  Administered 2021-11-24: 10 mL

## 2021-11-24 MED ORDER — SODIUM CHLORIDE 0.9 % IV SOLN
10.0000 mg | Freq: Once | INTRAVENOUS | Status: AC
  Administered 2021-11-24: 10 mg via INTRAVENOUS
  Filled 2021-11-24: qty 10

## 2021-11-24 NOTE — Progress Notes (Signed)
Ok to treat with ANC 1.4 today per Ardis Hughs. ?

## 2021-11-24 NOTE — Patient Instructions (Signed)
[facility]   ?Discharge Instructions: ?Thank you for choosing [facility] to provide your oncology and hematology care.  ? ?If you have a lab appointment with the [facility], please go directly to the [facility] and check in at the registration area. ?  ?Wear comfortable clothing and clothing appropriate for easy access to any Portacath or PICC line.  ? ?We strive to give you quality time with your provider. You may need to reschedule your appointment if you arrive late (15 or more minutes).  Arriving late affects you and other patients whose appointments are after yours.  Also, if you miss three or more appointments without notifying the office, you may be dismissed from the clinic at the provider?s discretion.    ?  ?For prescription refill requests, have your pharmacy contact our office and allow 72 hours for refills to be completed.   ? ?Today you received the following chemotherapy and/or immunotherapy agents: irinotecan and bevacizumab-bvzr    ?  ?To help prevent nausea and vomiting after your treatment, we encourage you to take your nausea medication as directed. ? ?BELOW ARE SYMPTOMS THAT SHOULD BE REPORTED IMMEDIATELY: ?*FEVER GREATER THAN 100.4 F (38 ?C) OR HIGHER ?*CHILLS OR SWEATING ?*NAUSEA AND VOMITING THAT IS NOT CONTROLLED WITH YOUR NAUSEA MEDICATION ?*UNUSUAL SHORTNESS OF BREATH ?*UNUSUAL BRUISING OR BLEEDING ?*URINARY PROBLEMS (pain or burning when urinating, or frequent urination) ?*BOWEL PROBLEMS (unusual diarrhea, constipation, pain near the anus) ?TENDERNESS IN MOUTH AND THROAT WITH OR WITHOUT PRESENCE OF ULCERS (sore throat, sores in mouth, or a toothache) ?UNUSUAL RASH, SWELLING OR PAIN  ?UNUSUAL VAGINAL DISCHARGE OR ITCHING  ? ?Items with * indicate a potential emergency and should be followed up as soon as possible or go to the Emergency Department if any problems should occur. ? ?Please show the CHEMOTHERAPY ALERT CARD or IMMUNOTHERAPY  ALERT CARD at check-in to the Emergency Department and triage nurse. ? ?Should you have questions after your visit or need to cancel or reschedule your appointment, please contact [facility]  Dept: [phone]  and follow the prompts.  Office hours are 8:00 a.m. to 4:30 p.m. Monday - Friday. Please note that voicemails left after 4:00 p.m. may not be returned until the following business day.  We are closed weekends and major holidays. You have access to a nurse at all times for urgent questions. Please call the main number to the clinic Dept: [phone] and follow the prompts. ? ? ?For any non-urgent questions, you may also contact your provider using MyChart. We now offer e-Visits for anyone 52 and older to request care online for non-urgent symptoms. For details visit [email or web address]. ?  ?Also download the MyChart app! Go to the app store, search "MyChart", open the app, select [facility], and log in with your MyChart username and password. ? ?Due to Covid, a mask is required upon entering the hospital/clinic. If you do not have a mask, one will be given to you upon arrival. For doctor visits, patients may have 1 support person aged 71 or older with them. For treatment visits, patients cannot have anyone with them due to current Covid guidelines and our immunocompromised population.  ? ?

## 2021-11-24 NOTE — Progress Notes (Signed)
?[facility]   ?[phone] [phone]   ?Clinic Follow up Note  ? ?Patient Care Team: ?Pcp, No as PCP - General ?[name], MD as Consulting Physician (Oncology) ?Earl Gala, Deliah Goody, RN as Art therapist (Oncology) ? ?Date of Service:  11/24/2021 ? ?CHIEF COMPLAINT: f/u of metastatic colon cancer ? ?CURRENT THERAPY:  ?-second-line irinotecan, started 09/21/21 and bevacizumab started 03/29/21 q 2 weeks ? ?SUMMARY OF ONCOLOGIC HISTORY: ?Oncology History Overview Note  ?Cancer Staging ?Cancer of right colon (Kari Patel) ?Staging form: Colon and Rectum - Neuroendocine Tumors, AJCC 8th Edition ?- Clinical stage from 12/13/2020: Stage IV (cTX, cN1, cM1) - Signed by [name], MD on 03/01/2021 ? ?  ?Cancer of right colon [facility])  ?12/07/2020 Procedure  ? Colonoscopy ? ?Impression: ?- non-bleeding internal hemorrhoids ?- diverticulosis in entire examined colon ?- one 9 mm polyp in ascending colon removed with hot snare ?- one 13 mm polyp in transverse colon removed with hot fnare ?- likely malignant partially-obstructing tumor in cecum. ?  ?12/07/2020 Pathology Results  ? FINAL PATHOLOGIC DIAGNOSIS ? ?SMALL BOWEL, BIOPSIES ?- duodenal mucosa within normal limits ?GASTRIC BODY AND ATRIUM, BIOPSY ?- gastric antral and body-type mucosa with moderate chronic Helicobacter pylori gastritis ?ASCENDING COLON, POLYPECTOMY ?- tubular adenoma with prominent eosinophilic infiltrate ?CECUM, "MASS," BIOPSIES ?- moderately differentiated adenocarcinoma ?TRANSVERSE COLON, POLYPECTOMY ?- high grade dysplasia involving tubulovillous adenoma ?- cauterized margins negative ?  ?12/13/2020 Cancer Staging  ? Staging form: Colon and Rectum - Neuroendocine Tumors, AJCC 8th Edition ?- Clinical stage from 12/13/2020: Stage IV (cTX, cN1, cM1) - Signed by [name], MD on 03/01/2021 ? ?  ?12/22/2020 Imaging  ? CT CAP ? 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 The largest measures 6 mm in the RUL. Findings are nonspecific but can be followed per Fleischner criteria ?Hiatal hernia ?  ?03/01/2021 Initial Diagnosis  ? Cancer of right colon ([place]) ?  ?03/10/2021 Imaging  ? CT CAP ? ?IMPRESSION: ?1. Large infiltrative cecal mass measuring approximately 7.1 cm, ?reflecting the known right-sided colonic neoplasm. ?2. Similar extensive peritoneal/omental nodularity throughout the ?abdomen and pelvis with trace free fluid in the pelvis along the ?pericolic gutters, consistent with disease involvement. ?3. Similar prominent and mildly enlarged retroperitoneal and ?mesenteric lymph nodes, most likely reflecting disease involvement. ?4. Numerous scattered bilateral pulmonary nodules measuring up to 6 mm are similar to prior and nonspecific but suspicious for disease involvement. Attention on follow-up imaging suggested. ?5. Similar mild right-sided hydronephrosis without hydroureter, with ?the distal ureter traversing peritoneal nodularity. Likely ?reflecting partial UPJ obstruction given lack of hydroureter. ?Attention on follow-up imaging suggested. Cholelithiasis without ?findings of acute cholecystitis. ?6.  Aortic Atherosclerosis (ICD10-I70.0). ?  ?03/13/2021 Pathology Results  ? FINAL MICROSCOPIC DIAGNOSIS:  ? ?A. OMENTUM, NEEDLE CORE BIOPSY:  ?- Metastatic adenocarcinoma, consistent with clinical impression of a  colonic primary.  See comment  ?  ?03/13/2021 Miscellaneous  ?  ?  ?03/16/2021 - 09/09/2021 Chemotherapy  ? Patient is on Treatment Plan : COLORECTAL FOLFOX + Bevacizumab q14d  ?   ?06/05/2021 Imaging  ? CT CAP ? ?IMPRESSION: ?Chest Impression: ?  ?1. Bilateral small pulmonary  nodules appear unchanged. ?2. No new nodularity. ?3. No metastatic adenopathy. ?  ?Abdomen / Pelvis Impression: ?  ?  1. Marked decrease in volume of circumferential mass surrounding the distal ileum leading up the terminal ileum. ?2. Marked decrease in size of peritoneal implants along the ventral ?peritoneal surface of the lower abdomen. Persistent nodularity again much improved. ?3. No evidence of liver metastasis. ?  ?09/15/2021 Imaging  ? CT CAP IMPRESSION: ?1. Multiple scattered tiny bilateral pulmonary nodules are stable to minimally increased in size in the interval. There is a new 11 mm anterior left upper lobe pulmonary nodule on today's study. ?2. Interval progression of soft tissue nodularity in the lower omentum with new disease now noted more cranially in the omentum. ?3. Wall thickening in the terminal ileum appears qualitatively progressed in the interval with a string sign lumen on today's study. No features to suggest obstruction at this point. ?4. Stable mild right hydronephrosis without associated hydroureter. ?5. Colonic diverticulosis without diverticulitis. ?6. Layering sludge or tiny stones in the gallbladder fundus. ?7. Aortic Atherosclerosis (ICD10-I70.0). ?  ?09/21/2021 -  Chemotherapy  ? Patient is on Treatment Plan : COLORECTAL FOLFIRI / BEVACIZUMAB Q14D  ?   ? ? ? ?INTERVAL HISTORY:  ?Kari Patel is here for a follow up of metastatic colon cancer. She was last seen by [name] on 11/10/2021. She is due for Cycle 5 of chemotherapy. She presents to the clinic alone. ? ?At today's visit, [name] reports that her energy levels are fairly stable.  She does have fatigue after chemotherapy but is able to complete her daily activities on her own.  Her appetite is unchanged and has lost 2 pounds since last visit.  She reports worsening diarrhea and abdominal cramping after taking gabapentin.  She stopped taking gabapentin this past Saturday and her diarrhea markedly improved.  She continues to have  neuropathy in her hands and feet.  She reports her grip is affected with trouble opening things.  She denies fevers, chills, night sweats, shortness of breath, chest pain or cough.   ?  ?All other systems were reviewed with the patient and are negative. ? ?MEDICAL HISTORY:  ?Past Medical History:  ?Diagnosis Date  ? Anemia   ? Colon cancer ([place])   ? cecum  ? Heart murmur   ? Hypertension   ? ? ?SURGICAL HISTORY: ?Past Surgical History:  ?Procedure Laterality Date  ? ABDOMINAL HYSTERECTOMY    ? IR IMAGING GUIDED PORT INSERTION  03/13/2021  ? ? ?I have reviewed the social history and family history with the patient and they are unchanged from previous note. ? ?ALLERGIES:  is allergic to demerol [meperidine hcl]. ? ?MEDICATIONS:  ?Current Outpatient Medications  ?Medication Sig Dispense Refill  ? amLODipine (NORVASC) 10 MG tablet TAKE 1 TABLET BY MOUTH EVERY DAY 20 tablet 0  ? diphenoxylate-atropine (LOMOTIL) 2.5-0.025 MG tablet Take 0.5-1 tablets by mouth 4 (four) times daily as needed for diarrhea or loose stools. 30 tablet 1  ? KLOR-CON M20 20 MEQ tablet TAKE 1 TABLET BY MOUTH EVERY DAY 90 tablet 1  ? lidocaine-prilocaine (EMLA) cream Apply 1 application. topically as needed. 30 g 4  ? lisinopril (ZESTRIL) 5 MG tablet TAKE 1 TABLET (5 MG TOTAL) BY MOUTH DAILY. 90 tablet 0  ? LORazepam (ATIVAN) 0.5 MG tablet Take 1 tablet (0.5 mg total) by mouth every 8 (eight) hours. 30 tablet 0  ? ondansetron (ZOFRAN) 8 MG tablet Take 1 tablet (8 mg total) by mouth every 8 (eight) hours as needed for nausea or vomiting. 20 tablet 1  ? pregabalin (LYRICA) 50 MG capsule Take  1 capsule (50 mg total) by mouth 2 (two) times daily. 60 capsule 0  ? prochlorperazine (COMPAZINE) 10 MG tablet Take 1 tablet (10 mg total) by mouth every 6 (six) hours as needed for nausea or vomiting. 30 tablet 1  ? sucralfate (CARAFATE) 1 GM/10ML suspension Take 10 mLs (1 g total) by mouth 4 (four) times daily -  with meals and at bedtime. 420 mL 0  ? ?No  current facility-administered medications for this visit.  ? ?Facility-Administered Medications Ordered in Other Visits  ?Medication Dose Route Frequency Provider Last Rate Last Admin  ? atropine injection 0.5 mg  0.

## 2021-11-24 NOTE — Telephone Encounter (Signed)
VM left on this RN's phone at 345pm from pt stating need for prescriptions given today to be sent to her [facility] on [address]. ? ?She will be staying in [place] over the weekend and not returning to [place] until next week. ? ?Pharmacy verified locally and prescription sent - note lyrica was called in by this RN per protocol. ? ? ?

## 2021-11-27 NOTE — Telephone Encounter (Signed)
Spoke with pt to give dates of CT scan.  She is going to [place] today and wants to leave Rx for Ativan there and not resent to [facility]. ?

## 2021-12-06 ENCOUNTER — Ambulatory Visit (HOSPITAL_COMMUNITY): Payer: Medicare HMO

## 2021-12-07 ENCOUNTER — Ambulatory Visit (HOSPITAL_COMMUNITY): Payer: Medicare HMO

## 2021-12-08 ENCOUNTER — Ambulatory Visit (HOSPITAL_COMMUNITY)
Admission: RE | Admit: 2021-12-08 | Discharge: 2021-12-08 | Disposition: A | Discharge status: Home / Self Care | Payer: Medicare HMO | Source: Ambulatory Visit | Attending: Hematology | Admitting: Hematology

## 2021-12-08 DIAGNOSIS — C182 Malignant neoplasm of ascending colon: Secondary | ICD-10-CM | POA: Diagnosis present

## 2021-12-08 MED ORDER — IOHEXOL 300 MG/ML  SOLN
100.0000 mL | Freq: Once | INTRAMUSCULAR | Status: AC | PRN
  Administered 2021-12-08: 75 mL via INTRAVENOUS

## 2021-12-08 MED ORDER — SODIUM CHLORIDE (PF) 0.9 % IJ SOLN
INTRAMUSCULAR | Status: AC
  Filled 2021-12-08: qty 50

## 2021-12-10 ENCOUNTER — Other Ambulatory Visit: Payer: Self-pay

## 2021-12-10 ENCOUNTER — Observation Stay (HOSPITAL_COMMUNITY)
Admission: EM | Admit: 2021-12-10 | Discharge: 2021-12-12 | Disposition: A | Discharge status: Home / Self Care | Payer: Medicare HMO | Attending: Internal Medicine | Admitting: Internal Medicine

## 2021-12-10 ENCOUNTER — Encounter (HOSPITAL_COMMUNITY): Payer: Self-pay | Admitting: Oncology

## 2021-12-10 DIAGNOSIS — Z79899 Other long term (current) drug therapy: Secondary | ICD-10-CM | POA: Insufficient documentation

## 2021-12-10 DIAGNOSIS — C787 Secondary malignant neoplasm of liver and intrahepatic bile duct: Secondary | ICD-10-CM | POA: Diagnosis not present

## 2021-12-10 DIAGNOSIS — C189 Malignant neoplasm of colon, unspecified: Secondary | ICD-10-CM

## 2021-12-10 DIAGNOSIS — E440 Moderate protein-calorie malnutrition: Secondary | ICD-10-CM | POA: Diagnosis not present

## 2021-12-10 DIAGNOSIS — I10 Essential (primary) hypertension: Secondary | ICD-10-CM | POA: Diagnosis not present

## 2021-12-10 DIAGNOSIS — C182 Malignant neoplasm of ascending colon: Secondary | ICD-10-CM | POA: Diagnosis not present

## 2021-12-10 DIAGNOSIS — R112 Nausea with vomiting, unspecified: Secondary | ICD-10-CM | POA: Diagnosis present

## 2021-12-10 DIAGNOSIS — K529 Noninfective gastroenteritis and colitis, unspecified: Secondary | ICD-10-CM | POA: Diagnosis not present

## 2021-12-10 DIAGNOSIS — C7800 Secondary malignant neoplasm of unspecified lung: Secondary | ICD-10-CM | POA: Insufficient documentation

## 2021-12-10 DIAGNOSIS — Z87891 Personal history of nicotine dependence: Secondary | ICD-10-CM | POA: Diagnosis not present

## 2021-12-10 DIAGNOSIS — K56600 Partial intestinal obstruction, unspecified as to cause: Secondary | ICD-10-CM | POA: Insufficient documentation

## 2021-12-10 LAB — COMPREHENSIVE METABOLIC PANEL
ALT: 11 U/L (ref 0–44)
AST: 17 U/L (ref 15–41)
Albumin: 4.5 g/dL (ref 3.5–5.0)
Alkaline Phosphatase: 71 U/L (ref 38–126)
Anion gap: 11 (ref 5–15)
BUN: 8 mg/dL (ref 8–23)
CO2: 27 mmol/L (ref 22–32)
Calcium: 10.1 mg/dL (ref 8.9–10.3)
Chloride: 101 mmol/L (ref 98–111)
Creatinine, Ser: 0.58 mg/dL (ref 0.44–1.00)
GFR, Estimated: >60 mL/min (ref >60)
Glucose, Bld: 125 mg/dL — ABNORMAL HIGH (ref 70–99)
Potassium: 3.6 mmol/L (ref 3.5–5.1)
Sodium: 139 mmol/L (ref 135–145)
Total Bilirubin: 0.9 mg/dL (ref 0.3–1.2)
Total Protein: 8.5 g/dL — ABNORMAL HIGH (ref 6.5–8.1)

## 2021-12-10 LAB — CBC
HCT: 41.9 % (ref 36.0–46.0)
Hemoglobin: 13.7 g/dL (ref 12.0–15.0)
MCH: 31.9 pg (ref 26.0–34.0)
MCHC: 32.7 g/dL (ref 30.0–36.0)
MCV: 97.7 fL (ref 80.0–100.0)
Platelets: 203 10*3/uL (ref 150–400)
RBC: 4.29 MIL/uL (ref 3.87–5.11)
RDW: 14.8 % (ref 11.5–15.5)
WBC: 5.3 10*3/uL (ref 4.0–10.5)
nRBC: 0 % (ref 0.0–0.2)

## 2021-12-10 LAB — TROPONIN I (HIGH SENSITIVITY): Troponin I (High Sensitivity): 7 ng/L (ref <18)

## 2021-12-10 LAB — LIPASE, BLOOD: Lipase: 26 U/L (ref 11–51)

## 2021-12-10 MED ORDER — ONDANSETRON HCL 4 MG/2ML IJ SOLN
4.0000 mg | Freq: Once | INTRAMUSCULAR | Status: AC
Start: 2021-12-10 — End: 2021-12-10
  Administered 2021-12-10: 4 mg via INTRAVENOUS
  Filled 2021-12-10: qty 2

## 2021-12-10 MED ORDER — SODIUM CHLORIDE 0.9 % IV SOLN
Freq: Once | INTRAVENOUS | Status: AC
Start: 2021-12-10 — End: 2021-12-11

## 2021-12-10 MED ORDER — SODIUM CHLORIDE 0.9 % IV BOLUS
1000.0000 mL | Freq: Once | INTRAVENOUS | Status: AC
  Administered 2021-12-10: 1000 mL via INTRAVENOUS

## 2021-12-10 MED ORDER — ONDANSETRON HCL 4 MG/2ML IJ SOLN
4.0000 mg | Freq: Four times a day (QID) | INTRAMUSCULAR | Status: DC | PRN
  Administered 2021-12-11: 4 mg via INTRAVENOUS
  Filled 2021-12-10: qty 2

## 2021-12-10 NOTE — ED Notes (Signed)
Patient assisted on and off bedpan.  Patient had small loose bowel movement, unable to obtain UA at this time.  ?

## 2021-12-10 NOTE — ED Triage Notes (Signed)
Pt c/o N/V that began this morning. Pt has taken zofran w/o relief.   ?

## 2021-12-10 NOTE — ED Provider Notes (Signed)
?[facility]-EMERGENCY DEPT ?Provider Note ? ? ?CSN: [id number] ?Arrival date & time: 12/10/21  1741 ? ?  ? ?History ? ?Chief Complaint  ?Patient presents with  ? Emesis  ? ? ?Kari Patel is a 71 y.o. female. ? ?Pt is a 71 yo female with pmh of colon cancer currently receiving chemotherapy for the past 11 months presenting for complaints of nausea and vomiting. Pt states this morning she began having nausea, vomiting, and inability to tolerate PO. 8-10 episodes of non-bloody emesis. Small bowel movement today. Denies abdominal pain.  ? ?Review demonstrates colonoscopy in 2022 demonstrating: ?Right colon Cancer with peritoneal and possible lung metastasis, G2, KRAS G13A mutation(+).  ? ?Most recent heme/onc visit 11/24/2021: ?Patient currently receiving chemotherapy. cycle 5 irinotecan/bevacizumab  ? ?The history is provided by the patient. No language interpreter was used.  ?Emesis ?Associated symptoms: no abdominal pain, no arthralgias, no chills, no cough, no fever and no sore throat   ? ?  ? ?Home Medications ?Prior to Admission medications   ?Medication Sig Start Date End Date Taking? Authorizing Provider  ?amLODipine (NORVASC) 10 MG tablet Take 1 tablet (10 mg total) by mouth daily. 11/24/21   Malachy Mood, MD  ?diphenoxylate-atropine (LOMOTIL) 2.5-0.025 MG tablet Take 0.5-1 tablets by mouth 4 (four) times daily as needed for diarrhea or loose stools. 11/07/21   Pollyann Samples, NP  ?KLOR-CON M20 20 MEQ tablet TAKE 1 TABLET BY MOUTH EVERY DAY 11/13/21   Malachy Mood, MD  ?lidocaine-prilocaine (EMLA) cream Apply 1 application. topically as needed. 10/18/21   Malachy Mood, MD  ?lisinopril (ZESTRIL) 5 MG tablet TAKE 1 TABLET (5 MG TOTAL) BY MOUTH DAILY. 09/04/21   Malachy Mood, MD  ?LORazepam (ATIVAN) 0.5 MG tablet Take 1 tablet (0.5 mg total) by mouth every 8 (eight) hours. 11/24/21   Briant Cedar, PA-C  ?ondansetron (ZOFRAN) 8 MG tablet Take 1 tablet (8 mg total) by mouth every 8 (eight) hours as needed for  nausea or vomiting. 11/24/21   Malachy Mood, MD  ?pregabalin (LYRICA) 50 MG capsule Take 1 capsule (50 mg total) by mouth 2 (two) times daily. 11/24/21   Malachy Mood, MD  ?prochlorperazine (COMPAZINE) 10 MG tablet Take 1 tablet (10 mg total) by mouth every 6 (six) hours as needed for nausea or vomiting. 03/14/21   Malachy Mood, MD  ?sucralfate (CARAFATE) 1 g tablet TAKE 1 TABLET BY MOUTH 4 TIMES DAILY - WITH MEALS AND AT BEDTIME. 11/24/21   Malachy Mood, MD  ?sucralfate (CARAFATE) 1 GM/10ML suspension Take 10 mLs (1 g total) by mouth 4 (four) times daily -  with meals and at bedtime. 05/25/21   Malachy Mood, MD  ?   ? ?Allergies    ?Demerol [meperidine hcl]   ? ?Review of Systems   ?Review of Systems  ?Constitutional:  Negative for chills and fever.  ?HENT:  Negative for ear pain and sore throat.   ?Eyes:  Negative for pain and visual disturbance.  ?Respiratory:  Negative for cough and shortness of breath.   ?Cardiovascular:  Negative for chest pain and palpitations.  ?Gastrointestinal:  Positive for nausea and vomiting. Negative for abdominal pain.  ?Genitourinary:  Negative for dysuria and hematuria.  ?Musculoskeletal:  Negative for arthralgias and back pain.  ?Skin:  Negative for color change and rash.  ?Neurological:  Negative for seizures and syncope.  ?All other systems reviewed and are negative. ? ?Physical Exam ?Updated Vital Signs ?BP (!) 171/96   Pulse 82  Temp 97.7 ?F (36.5 ?C) (Oral)   Resp (!) 22   Ht $R'5\' 4"'Kg$  (1.626 m)   Wt 59 kg   SpO2 96%   BMI 22.31 kg/m?  ?Physical Exam ?Vitals and nursing note reviewed.  ?Constitutional:   ?   General: She is not in acute distress. ?   Appearance: She is well-developed.  ?HENT:  ?   Head: Normocephalic and atraumatic.  ?Eyes:  ?   Conjunctiva/sclera: Conjunctivae normal.  ?Cardiovascular:  ?   Rate and Rhythm: Normal rate and regular rhythm.  ?   Heart sounds: No murmur heard. ?Pulmonary:  ?   Effort: Pulmonary effort is normal. No respiratory distress.  ?   Breath sounds:  Normal breath sounds.  ?Abdominal:  ?   Palpations: Abdomen is soft.  ?   Tenderness: There is no abdominal tenderness.  ?Musculoskeletal:     ?   General: No swelling.  ?   Cervical back: Neck supple.  ?Skin: ?   General: Skin is warm and dry.  ?   Capillary Refill: Capillary refill takes less than 2 seconds.  ?Neurological:  ?   Mental Status: She is alert.  ?Psychiatric:     ?   Mood and Affect: Mood normal.  ? ? ?ED Results / Procedures / Treatments   ?Labs ?(all labs ordered are listed, but only abnormal results are displayed) ?Labs Reviewed  ?COMPREHENSIVE METABOLIC PANEL - Abnormal; Notable for the following components:  ?    Result Value  ? Glucose, Bld 125 (*)   ? Total Protein 8.5 (*)   ? All other components within normal limits  ?LIPASE, BLOOD  ?CBC  ?URINALYSIS, ROUTINE W REFLEX MICROSCOPIC  ?TROPONIN I (HIGH SENSITIVITY)  ? ? ?EKG ?None ? ?Radiology ?No results found. ? ?Procedures ?Procedures  ? ? ?Medications Ordered in ED ?Medications  ?ondansetron (ZOFRAN) injection 4 mg (has no administration in time range)  ?0.9 %  sodium chloride infusion (has no administration in time range)  ?sodium chloride 0.9 % bolus 1,000 mL (0 mLs Intravenous Stopped 12/10/21 2055)  ?ondansetron [facility]) injection 4 mg (4 mg Intravenous Given 12/10/21 1945)  ? ? ?ED Course/ Medical Decision Making/ A&P ?  ?                        ?Medical Decision Making ?Amount and/or Complexity of Data Reviewed ?Labs: ordered. ?ECG/medicine tests: ordered. ? ?Risk ?Prescription drug management. ? ? ?11:33 PM ?71 yo female with pmh of colon cancer currently receiving chemotherapy for the past 11 months presenting for complaints of nausea and vomiting. ? ? ?Review demonstrates colonoscopy in 2022 demonstrating: ?Right colon Cancer with peritoneal and possible lung metastasis, G2, KRAS G13A mutation(+).  ? ?Most recent heme/onc visit 11/24/2021: ?Patient currently receiving chemotherapy. cycle 5 irinotecan/bevacizumab  ? ?Stable ECG as  interpreted by myself.  Stable troponin.  Stable electrolytes.  Doubt ACS etiology. ? ?No signs or symptoms of sepsis ?-Laboratory studies demonstrate no leukocytosis. ?-Stable liver profile, lipase, and renal function. ? ? ?CT chest/abdomen/pelvis with oral contrast completed two days ago on 12/08/2021 demonstrates: ? ?1. Progressive soft tissue nodularity in the lower abdomen, right ?greater than left, consistent with progressive metastatic disease. ?2. Ill-defined 9 mm hypodensity in the right hepatic lobe was not ?seen on the prior exam, suspicious for metastasis. ?3. Multiple pulmonary nodules, with largest nodule slightly ?increasing in size from prior exam. The remainder of the nodules are ?stable. Some of the nodules  are now cavitary indicating treatment ?response. ?4. Wall thickening in the terminal ileum appears similar with stable ?adjacent soft tissue nodule. There is new wall thickening and ?perienteric edema in the distal ileum in the low pelvis that may ?represent enteritis. More proximal small bowel is prominent in ?caliber, and contrast does not reach the colon. Partial obstruction ?is considered. ?5. Trace perihepatic ascites, new. ?6. Slight decreased right hydronephrosis without hydroureter. ?7. Colonic diverticulosis without acute inflammation. ?8. Layering gallstones or sludge without inflammation. ? ?Patient up to date for new findings.  ? ?General surgery on consult for management of partial bowel obstruction. Will see in morning.  Spoke with admitting physician Dr. Marlowe Sax who agrees to accept patient. Plans for heme/onc consult in the morning. ? ? ? ? ? ? ? 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 ? ?Rx / DC Orders ?ED Discharge Orders   ? ? None  ? ?  ? ? ?  ?Lianne Cure,  DO ?61/54/88 2335 ? ?

## 2021-12-10 NOTE — ED Notes (Signed)
Patient resting comfortably, family at bedside.  Patient given pillow per request.  ?

## 2021-12-10 NOTE — ED Notes (Signed)
Patient states she feels better post zofran. ?

## 2021-12-11 ENCOUNTER — Observation Stay (HOSPITAL_COMMUNITY): Payer: Medicare HMO

## 2021-12-11 ENCOUNTER — Telehealth: Payer: Medicare HMO | Admitting: Hematology

## 2021-12-11 DIAGNOSIS — E440 Moderate protein-calorie malnutrition: Secondary | ICD-10-CM | POA: Insufficient documentation

## 2021-12-11 DIAGNOSIS — I10 Essential (primary) hypertension: Secondary | ICD-10-CM

## 2021-12-11 DIAGNOSIS — K529 Noninfective gastroenteritis and colitis, unspecified: Secondary | ICD-10-CM

## 2021-12-11 LAB — URINALYSIS, ROUTINE W REFLEX MICROSCOPIC
Bilirubin Urine: NEGATIVE
Glucose, UA: NEGATIVE mg/dL
Hgb urine dipstick: NEGATIVE
Ketones, ur: 20 mg/dL — AB
Leukocytes,Ua: NEGATIVE
Nitrite: NEGATIVE
Protein, ur: 100 mg/dL — AB
Specific Gravity, Urine: 1.024 (ref 1.005–1.030)
pH: 5 (ref 5.0–8.0)

## 2021-12-11 LAB — BASIC METABOLIC PANEL
Anion gap: 8 (ref 5–15)
BUN: 10 mg/dL (ref 8–23)
CO2: 22 mmol/L (ref 22–32)
Calcium: 8.2 mg/dL — ABNORMAL LOW (ref 8.9–10.3)
Chloride: 106 mmol/L (ref 98–111)
Creatinine, Ser: 0.61 mg/dL (ref 0.44–1.00)
GFR, Estimated: >60 mL/min (ref >60)
Glucose, Bld: 93 mg/dL (ref 70–99)
Potassium: 3.4 mmol/L — ABNORMAL LOW (ref 3.5–5.1)
Sodium: 136 mmol/L (ref 135–145)

## 2021-12-11 LAB — CBC
HCT: 32.2 % — ABNORMAL LOW (ref 36.0–46.0)
Hemoglobin: 10.4 g/dL — ABNORMAL LOW (ref 12.0–15.0)
MCH: 32 pg (ref 26.0–34.0)
MCHC: 32.3 g/dL (ref 30.0–36.0)
MCV: 99.1 fL (ref 80.0–100.0)
Platelets: 139 10*3/uL — ABNORMAL LOW (ref 150–400)
RBC: 3.25 MIL/uL — ABNORMAL LOW (ref 3.87–5.11)
RDW: 15.2 % (ref 11.5–15.5)
WBC: 5 10*3/uL (ref 4.0–10.5)
nRBC: 0 % (ref 0.0–0.2)

## 2021-12-11 MED ORDER — HYDRALAZINE HCL 20 MG/ML IJ SOLN: 5.0000 mg | Freq: Four times a day (QID) | INTRAMUSCULAR | Status: DC | PRN

## 2021-12-11 MED ORDER — DULOXETINE HCL 20 MG PO CPEP
20.0000 mg | ORAL_CAPSULE | Freq: Every day | ORAL | Status: DC
  Administered 2021-12-11 – 2021-12-12 (×2): 20 mg via ORAL
  Filled 2021-12-11 (×3): qty 1

## 2021-12-11 MED ORDER — ACETAMINOPHEN 325 MG PO TABS
650.0000 mg | ORAL_TABLET | Freq: Four times a day (QID) | ORAL | Status: DC | PRN
  Administered 2021-12-11: 650 mg via ORAL
  Filled 2021-12-11: qty 2

## 2021-12-11 MED ORDER — SUCRALFATE 1 GM/10ML PO SUSP
1.0000 g | Freq: Three times a day (TID) | ORAL | Status: DC
  Administered 2021-12-11 – 2021-12-12 (×4): 1 g via ORAL
  Filled 2021-12-11 (×4): qty 10

## 2021-12-11 MED ORDER — ADULT MULTIVITAMIN LIQUID CH
15.0000 mL | Freq: Every day | ORAL | Status: DC
  Administered 2021-12-11 – 2021-12-12 (×2): 15 mL via ORAL
  Filled 2021-12-11 (×2): qty 15

## 2021-12-11 MED ORDER — LISINOPRIL 5 MG PO TABS
5.0000 mg | ORAL_TABLET | Freq: Every day | ORAL | Status: DC
  Administered 2021-12-11 – 2021-12-12 (×2): 5 mg via ORAL
  Filled 2021-12-11 (×2): qty 1

## 2021-12-11 MED ORDER — SODIUM CHLORIDE 0.9 % IV SOLN
INTRAVENOUS | Status: AC
Start: 2021-12-11 — End: 2021-12-11

## 2021-12-11 MED ORDER — BOOST / RESOURCE BREEZE PO LIQD CUSTOM
1.0000 | Freq: Two times a day (BID) | ORAL | Status: DC
  Administered 2021-12-11 – 2021-12-12 (×2): 1 via ORAL

## 2021-12-11 MED ORDER — SUCRALFATE 1 G PO TABS: 1.0000 g | ORAL_TABLET | Freq: Three times a day (TID) | ORAL | Status: DC

## 2021-12-11 MED ORDER — LORAZEPAM 2 MG/ML IJ SOLN
0.5000 mg | Freq: Once | INTRAMUSCULAR | Status: AC
  Administered 2021-12-11: 0.5 mg via INTRAVENOUS
  Filled 2021-12-11: qty 1

## 2021-12-11 MED ORDER — ADULT MULTIVITAMIN W/MINERALS CH: 1.0000 | ORAL_TABLET | Freq: Every day | ORAL | Status: DC

## 2021-12-11 MED ORDER — AMLODIPINE BESYLATE 10 MG PO TABS
10.0000 mg | ORAL_TABLET | Freq: Every day | ORAL | Status: DC
  Administered 2021-12-11 – 2021-12-12 (×2): 10 mg via ORAL
  Filled 2021-12-11 (×2): qty 1

## 2021-12-11 MED ORDER — POTASSIUM CHLORIDE 20 MEQ PO PACK
40.0000 meq | PACK | Freq: Once | ORAL | Status: AC
  Administered 2021-12-11: 40 meq via ORAL
  Filled 2021-12-11: qty 2

## 2021-12-11 MED ORDER — LORAZEPAM 0.5 MG PO TABS
0.5000 mg | ORAL_TABLET | Freq: Three times a day (TID) | ORAL | Status: DC
  Administered 2021-12-11 – 2021-12-12 (×3): 0.5 mg via ORAL
  Filled 2021-12-11 (×4): qty 1

## 2021-12-11 NOTE — Progress Notes (Signed)
Patient admitted to the hospital earlier this morning by Dr. Marlowe Sax. ? ?Patient seen and examined.  She is lying in bed.  Overall, vomiting and abdominal pain issues are better.  She is having bowel movements.  She is tolerating liquids and wishes to try solid food. ? ?Patient with stage IV colon cancer, admitted to the hospital with partial small bowel obstruction.  General surgery and oncology following.  Fortunately, it appears that her obstruction appears to be resolving.  She was started on a diet and is tolerating liquids.  She is not had further vomiting and she is having bowel movements.  We will continue to advance to solid diet.  Work on ambulation. ? ?Seen by oncology, unfortunately her CT imaging shows progression of malignancy.  Her chemotherapy regimen will be adjusted as an outpatient.  She will be started on Cymbalta for peripheral neuropathy.  Hopeful for discharge in a.m. if overall symptoms continue to improve. ? ?Kari Patel ? ? ?

## 2021-12-11 NOTE — ED Notes (Signed)
Patient states her nausea has improved. Patients son is at her bedside. JRPRN ?

## 2021-12-11 NOTE — Progress Notes (Addendum)
HEMATOLOGY-ONCOLOGY PROGRESS NOTE ? ?ASSESSMENT AND PLAN: ?1.  Metastatic colon cancer ?2.  Partial small bowel obstruction ?3.  Possible enteritis on CT ?4.  Hypertension ? ?-CT scans have been reviewed.  Kari Patel appears to have disease progression on her recent CT scan.  Kari Patel will talk further with [name] regarding additional treatment options. ?-Kari Patel is clinically and radiographically improving from the partial small bowel obstruction.  Kari Patel is no longer having nausea, vomiting, constipation.  Tolerating full liquid diet today.  Continue close monitoring. ? ?[name], DNP, AGPCNP-BC, AOCNP ? ?SUBJECTIVE: Kari Patel is followed by our office for metastatic colon cancer.  Kari Patel is currently receiving second line treatment with Irinotecan and bevacizumab.  Last given on 11/24/2021.  Now admitted with enteritis and partial small bowel obstruction.  Restaging CT chest, abdomen, pelvis performed 12/08/2021 showed progressive soft tissue nodularity in the lower abdomen, right greater than left consistent with progressive metastatic disease.  There was also an ill-defined 9 mm hypodensity in the right hepatic lobe suspicious for metastasis and multiple pulmonary nodules.  Kari Patel has wall thickening in the terminal ileum which appears similar with an adjacent soft tissue nodule but Kari Patel has new wall thickening and perienteric edema in the distal ileum which could represent enteritis.  The more proximal small bowel is prominent in contrast did not reach the colon and partial obstruction is considered. ? ?Today, the patient reports that Kari Patel is feeling better.  Kari Patel is not having any abdominal pain.  Her nausea and vomiting have resolved.  Bowels are now moving.  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  The patient reports that Kari Patel normally has diarrhea related to Irinotecan.  Kari Patel is not having diarrhea currently. ? ?Oncology History Overview Note  ?Cancer Staging ?Cancer of right colon  ([place]) ?Staging form: Colon and Rectum - Neuroendocine Tumors, AJCC 8th Edition ?- Clinical stage from 12/13/2020: Stage IV (cTX, cN1, cM1) - Signed by [name], MD on 03/01/2021 ? ?  ?Cancer of right colon [facility])  ?12/07/2020 Procedure  ? Colonoscopy ? ?Impression: ?- non-bleeding internal hemorrhoids ?- diverticulosis in entire examined colon ?- one 9 mm polyp in ascending colon removed with hot snare ?- one 13 mm polyp in transverse colon removed with hot fnare ?- likely malignant partially-obstructing tumor in cecum. ?  ?12/07/2020 Pathology Results  ? FINAL PATHOLOGIC DIAGNOSIS ? ?SMALL BOWEL, BIOPSIES ?- duodenal mucosa within normal limits ?GASTRIC BODY AND ATRIUM, BIOPSY ?- gastric antral and body-type mucosa with moderate chronic Helicobacter pylori gastritis ?ASCENDING COLON, POLYPECTOMY ?- tubular adenoma with prominent eosinophilic infiltrate ?CECUM, "MASS," BIOPSIES ?- moderately differentiated adenocarcinoma ?TRANSVERSE COLON, POLYPECTOMY ?- high grade dysplasia involving tubulovillous adenoma ?- cauterized margins negative ?  ?12/13/2020 Cancer Staging  ? Staging form: Colon and Rectum - Neuroendocine Tumors, AJCC 8th Edition ?- Clinical stage from 12/13/2020: Stage IV (cTX, cN1, cM1) - Signed by [name], MD on 03/01/2021 ? ?  ?12/22/2020 Imaging  ? CT CAP ? 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 The largest measures 6 mm in the RUL. Findings are nonspecific but can be followed per Fleischner criteria ?Hiatal hernia ?  ?03/01/2021 Initial Diagnosis  ? Cancer of right colon ([place]) ? ?  ?03/10/2021 Imaging  ? CT CAP ? ?IMPRESSION: ?  1. Large  infiltrative cecal mass measuring approximately 7.1 cm, ?reflecting the known right-sided colonic neoplasm. ?2. Similar extensive peritoneal/omental nodularity throughout the ?abdomen and pelvis with trace free fluid in the pelvis along the ?pericolic gutters, consistent with disease involvement. ?3. Similar prominent and mildly enlarged retroperitoneal and ?mesenteric lymph nodes, most likely reflecting disease involvement. ?4. Numerous scattered bilateral pulmonary nodules measuring up to 6 mm are similar to prior and nonspecific but suspicious for disease involvement. Attention on follow-up imaging suggested. ?5. Similar mild right-sided hydronephrosis without hydroureter, with ?the distal ureter traversing peritoneal nodularity. Likely ?reflecting partial UPJ obstruction given lack of hydroureter. ?Attention on follow-up imaging suggested. Cholelithiasis without ?findings of acute cholecystitis. ?6.  Aortic Atherosclerosis (ICD10-I70.0). ?  ?03/13/2021 Pathology Results  ? FINAL MICROSCOPIC DIAGNOSIS:  ? ?A. OMENTUM, NEEDLE CORE BIOPSY:  ?- Metastatic adenocarcinoma, consistent with clinical impression of a  colonic primary.  See comment  ?  ?03/13/2021 Miscellaneous  ?  ?  ?03/16/2021 - 09/09/2021 Chemotherapy  ? Patient is on Treatment Plan : COLORECTAL FOLFOX + Bevacizumab q14d  ? ?  ?  ?06/05/2021 Imaging  ? CT CAP ? ?IMPRESSION: ?Chest Impression: ?  ?1. Bilateral small pulmonary nodules appear unchanged. ?2. No new nodularity. ?3. No metastatic adenopathy. ?  ?Abdomen / Pelvis Impression: ?  ?1. Marked decrease in volume of circumferential mass surrounding the distal ileum leading up the terminal ileum. ?2. Marked decrease in size of peritoneal implants along the ventral ?peritoneal surface of the lower abdomen. Persistent nodularity again much improved. ?3. No evidence of liver metastasis. ?  ?09/15/2021 Imaging  ? CT CAP IMPRESSION: ?1. Multiple scattered tiny bilateral pulmonary nodules are stable to minimally  increased in size in the interval. There is a new 11 mm anterior left upper lobe pulmonary nodule on today's study. ?2. Interval progression of soft tissue nodularity in the lower omentum with new disease now noted more cranially in the omentum. ?3. Wall thickening in the terminal ileum appears qualitatively progressed in the interval with a string sign lumen on today's study. No features to suggest obstruction at this point. ?4. Stable mild right hydronephrosis without associated hydroureter. ?5. Colonic diverticulosis without diverticulitis. ?6. Layering sludge or tiny stones in the gallbladder fundus. ?7. Aortic Atherosclerosis (ICD10-I70.0). ?  ?09/21/2021 -  Chemotherapy  ? Patient is on Treatment Plan : COLORECTAL FOLFIRI / BEVACIZUMAB Q14D  ? ?  ?  ? ? ? ?REVIEW OF SYSTEMS:   ?Review of Systems  ?Constitutional:  Negative for chills and fever.  ?HENT: Negative.    ?Eyes: Negative.   ?Respiratory: Negative.    ?Cardiovascular: Negative.   ?Gastrointestinal:   ?     Nausea, vomiting, constipation have resolved  ?Skin: Negative.   ?Neurological: Negative.   ?Psychiatric/Behavioral: Negative.    ? ?I have reviewed the past medical history, past surgical history, social history and family history with the patient and they are unchanged from previous note. ? ? ?PHYSICAL EXAMINATION: ?ECOG PERFORMANCE STATUS: 2 - Symptomatic, <50% confined to bed ? ?Vitals:  ? 12/11/21 0544 12/11/21 0926  ?BP: 118/62 123/67  ?Pulse: 86 79  ?Resp: 18 16  ?Temp: 99.8 ?F (37.7 ?C) 100.3 ?F (37.9 ?C)  ?SpO2: 98% 100%  ? ?Filed Weights  ? 12/10/21 1758 12/11/21 0121  ?Weight: 59 kg 54.6 kg  ? ? ?Intake/Output from previous day: ?04/30 0701 - 05/01 0700 ?In: 175.6 [I.V.:175.6] ?Out: -  ? ?Physical Exam ?Vitals reviewed.  ?Constitutional:   ?   General: Kari Patel  is not in acute distress. ?HENT:  ?   Head: Normocephalic.  ?Eyes:  ?   General: No scleral icterus. ?   Conjunctiva/sclera: Conjunctivae normal.  ?Cardiovascular:  ?   Rate and Rhythm:  Normal rate.  ?Abdominal:  ?   General: Bowel sounds are normal. There is no distension.  ?   Palpations: Abdomen is soft.  ?   Tenderness: There is no abdominal tenderness.  ?Skin: ?   General: Skin is warm and dry

## 2021-12-11 NOTE — Consult Note (Signed)
? ? ? ?Kari Patel ?03/27/1951  ?[id number].   ? ?Requesting MD: Dr. Kathie Dike ?Chief Complaint/Reason for Consult: SBO ? ?HPI: Kari Patel is a 71 y.o. female w/ a hx of Metastatic R colon CA Stage IV (cTX, cN1, cM1) dx in April 2022 who is followed by [name] and currently on Chemo (irinotecan/beva - last infusion 4/14) who presented to [facility] ED on 4/30 for an/PE.  Patient reports yesterday morning she began having some upper abdominal bloating/fullness along with nausea followed by 8-10 episodes of emesis.  Was not able to tolerate p.o.'s.  Was not passing flatus after symptom onset. CT showed wall thickening in the terminal ileum appears similar with stable adjacent soft tissue nodule; new wall thickening and perienteric edema in the distal ileum in the low pelvis that may represent enteritis - partial obstruction considered. WBC 5.3 this AM. [facility] Gen surgery was asked to see.  Since admission she reports her symptoms resolved.  She no longer has any abdominal pain/fullness/bloating, nausea or vomiting.  She is passing flatus.  She had a normal sized soft BM yesterday and a smaller soft BM this morning.  She has no complaints.  She has a history of prior abdominal hysterectomy.  Past medical history otherwise significant for hypertension.  She is not on blood thinners. ? ?To note - CT also showed progressive soft tissue nodularity in the lower abdomen, right greater than left, consistent with progressive metastatic disease; new 9 mm hypodensity in the right hepatic lobe suspicious for metastasis; multiple pulmonary nodules, with largest nodule slightly increasing in size from prior exam and the remainder of the nodules are stable. ? ? ?ROS: ?Review of Systems  ?Constitutional:  Negative for chills and fever.  ?Respiratory:  Negative for shortness of breath.   ?Cardiovascular:  Negative for chest pain.  ?Gastrointestinal:  Positive for abdominal pain, nausea and vomiting. Negative for  diarrhea.  ?Genitourinary: Negative.   ? ?Family History  ?Problem Relation Age of Onset  ? Stroke Mother   ? Hypertension Mother   ? Stroke Father   ? Hypertension Father   ? ? ?Past Medical History:  ?Diagnosis Date  ? Anemia   ? Colon cancer ([place])   ? cecum  ? Heart murmur   ? Hypertension   ? ? ?Past Surgical History:  ?Procedure Laterality Date  ? ABDOMINAL HYSTERECTOMY    ? IR IMAGING GUIDED PORT INSERTION  03/13/2021  ? ? ?Social History:  reports that she quit smoking about 28 years ago. Her smoking use included cigarettes. She has a 2.50 pack-year smoking history. She has never used smokeless tobacco. She reports that she does not currently use alcohol. She reports that she does not use drugs. ? ?Allergies:  ?Allergies  ?Allergen Reactions  ? Demerol [Meperidine Hcl] Nausea Only  ? ? ?Medications Prior to Admission  ?Medication Sig Dispense Refill  ? amLODipine (NORVASC) 10 MG tablet Take 1 tablet (10 mg total) by mouth daily. 20 tablet 0  ? KLOR-CON M20 20 MEQ tablet TAKE 1 TABLET BY MOUTH EVERY DAY (Patient taking differently: Take 20 mEq by mouth daily as needed (only after having blood work done).) 90 tablet 1  ? lidocaine-prilocaine (EMLA) cream Apply 1 application. topically as needed. (Patient taking differently: Apply 1 application. topically as needed (for port access).) 30 g 4  ? lisinopril (ZESTRIL) 5 MG tablet TAKE 1 TABLET (5 MG TOTAL) BY MOUTH DAILY. (Patient taking differently: Take 5 mg by mouth daily.)  90 tablet 0  ? loperamide (IMODIUM) 2 MG capsule Take 2 mg by mouth as needed for diarrhea or loose stools.    ? LORazepam (ATIVAN) 0.5 MG tablet Take 1 tablet (0.5 mg total) by mouth every 8 (eight) hours. (Patient taking differently: Take 0.5 mg by mouth every 8 (eight) hours as needed (Anxiety, Cancer Chemotherapy-Induced Nausea and Vomiting).) 30 tablet 0  ? ondansetron (ZOFRAN) 8 MG tablet Take 1 tablet (8 mg total) by mouth every 8 (eight) hours as needed for nausea or vomiting. 20 tablet  1  ? sucralfate (CARAFATE) 1 g tablet TAKE 1 TABLET BY MOUTH 4 TIMES DAILY - WITH MEALS AND AT BEDTIME. (Patient taking differently: Take 1 g by mouth 4 (four) times daily. WITH MEALS AND AT BEDTIME.) 60 tablet 0  ? diphenoxylate-atropine (LOMOTIL) 2.5-0.025 MG tablet Take 0.5-1 tablets by mouth 4 (four) times daily as needed for diarrhea or loose stools. (Patient not taking: Reported on 12/11/2021) 30 tablet 1  ? pregabalin (LYRICA) 50 MG capsule Take 1 capsule (50 mg total) by mouth 2 (two) times daily. (Patient not taking: Reported on 12/11/2021) 60 capsule 0  ? prochlorperazine (COMPAZINE) 10 MG tablet Take 1 tablet (10 mg total) by mouth every 6 (six) hours as needed for nausea or vomiting. (Patient not taking: Reported on 12/11/2021) 30 tablet 1  ? ? ? ?Physical Exam: ?Blood pressure 118/62, pulse 86, temperature 99.8 ?F (37.7 ?C), temperature source Oral, resp. rate 18, height '5\' 4"'$  (1.626 m), weight 54.6 kg, SpO2 98 %. ?General: pleasant, frail appearing elderly female who is laying in bed in NAD ?HEENT: head is normocephalic, atraumatic.  Sclera are noninjected.  PERRL.  Ears and nose without any masses or lesions.  Mouth is pink and moist.  Edentulous, normally wears dentures ?Heart: regular, rate, and rhythm.   Palpable radial pulses bilaterally  ?Lungs: CTAB, no wheezes, rhonchi, or rales noted.  Respiratory effort nonlabored. Port in place w/o overlying skin changes.  ?Abd:  Soft, NT, ND, +BS. Prior hysterectomy scar well-healed ?MS: no BUE/BLE edema, calves soft and nontender ?Skin: warm and dry with no masses, lesions, or rashes ?Psych: A&Ox4 with an appropriate affect ?Neuro: cranial nerves grossly intact, normal speech, thought process intact, moves all extremities, gait not assessed ? ?Results for orders placed or performed during the hospital encounter of 12/10/21 (from the past 48 hour(s))  ?Lipase, blood     Status: None  ? Collection Time: 12/10/21  6:30 PM  ?Result Value Ref [place]  ? Lipase 26 11 -  51 U/L  ?  Comment: Performed at [facility], [place] [address], [place], [place] [address]  ?Comprehensive metabolic panel     Status: Abnormal  ? Collection Time: 12/10/21  6:30 PM  ?Result Value Ref [place]  ? Sodium 139 135 - 145 mmol/L  ? Potassium 3.6 3.5 - 5.1 mmol/L  ? Chloride 101 98 - 111 mmol/L  ? CO2 27 22 - 32 mmol/L  ? Glucose, Bld 125 (H) 70 - 99 mg/dL  ?  Comment: Glucose reference range applies only to samples taken after fasting for at least 8 hours.  ? BUN 8 8 - 23 mg/dL  ? Creatinine, Ser 0.58 0.44 - 1.00 mg/dL  ? Calcium 10.1 8.9 - 10.3 mg/dL  ? Total Protein 8.5 (H) 6.5 - 8.1 g/dL  ? Albumin 4.5 3.5 - 5.0 g/dL  ? AST 17 15 - 41 U/L  ? ALT 11 0 - 44 U/L  ?  Alkaline Phosphatase 71 38 - 126 U/L  ? Total Bilirubin 0.9 0.3 - 1.2 mg/dL  ? GFR, Estimated >60 >60 mL/min  ?  Comment: (NOTE) ?Calculated using the CKD-EPI Creatinine Equation (2021) ?  ? Anion gap 11 5 - 15  ?  Comment: Performed at [facility], [place] [address] [place], [place] [address]  ?CBC     Status: None  ? Collection Time: 12/10/21  6:30 PM  ?Result Value Ref [place]  ? WBC 5.3 4.0 - 10.5 K/uL  ? RBC 4.29 3.87 - 5.11 MIL/uL  ? Hemoglobin 13.7 12.0 - 15.0 g/dL  ? HCT 41.9 36.0 - 46.0 %  ? MCV 97.7 80.0 - 100.0 fL  ? MCH 31.9 26.0 - 34.0 pg  ? MCHC 32.7 30.0 - 36.0 g/dL  ? RDW 14.8 11.5 - 15.5 %  ? Platelets 203 150 - 400 K/uL  ? nRBC 0.0 0.0 - 0.2 %  ?  Comment: Performed at [facility], [place] [address], [place], [place] [address]  ?Troponin I (High Sensitivity)     Status: None  ? Collection Time: 12/10/21  6:30 PM  ?Result Value Ref [place]  ? Troponin I (High Sensitivity) 7 <18 ng/L  ?  Comment: (NOTE) ?Elevated high sensitivity troponin I (hsTnI) values and significant  ?changes across serial measurements may suggest ACS but many other  ?chronic and acute conditions are known to elevate hsTnI results.  ?Refer to the "Links" section for chest pain algorithms and additional   ?guidance. ?Performed at [facility], [address] ?[place], [place] [address] ?  ? ?DG Abd Portable 1V ? ?Result Date: 12/11/2021 ?CLINICAL DATA:  Small-bowel obstruction.  History of col

## 2021-12-11 NOTE — ED Notes (Addendum)
Patient complaining of nausea. Zofran given for nausea. JRPRN ?

## 2021-12-11 NOTE — Discharge Instructions (Signed)
[place] Hospital Stay ?Proper nutrition can help your body recover from illness and injury.   ?Foods and beverages high in protein, vitamins, and minerals help rebuild muscle loss, promote healing, & reduce fall risk.  ? ?In addition to eating healthy foods, a nutrition shake is an easy, delicious way to get the nutrition you need during and after your hospital stay ? ?It is recommended that you continue to drink 3 bottles per day of: Boost Breeze for at least 1 month (30 days) after your hospital stay  ? ?Tips for adding a nutrition shake into your routine: ?As allowed, drink one with vitamins or medications instead of water or juice ?Enjoy one as a tasty mid-morning or afternoon snack ?Drink cold or make a milkshake out of it ?Drink one instead of milk with cereal or snacks ?Use as a coffee creamer ?  ?Available at the following grocery stores and pharmacies:           ?* [place]  ?* [facility]          * [place]  ?* [facility]      * Target  * [employer]   ?* [facility]  * [employer]   ?[facility] [phone]  ?          ?For COUPONS visit: [email or web address]/join or [email or web address]  ? ?Suggested Substitutions ?Ensure Plus = Boost Plus = Carnation Breakfast Essentials = Boost Compact ?Ensure Active Clear = Boost Breeze ?Glucerna Shake = Boost Glucose Control = Carnation Breakfast Essentials SUGAR FREE ? ? ? ?High Calorie, High Protein Nutrition Therapy ? ?A high-calorie, high-protein diet has been recommended to you. Your registered dietitian nutritionist (RDN) may have recommended this diet because you are having difficulty eating enough calories throughout the day, you have lost weight, and/or you need to add protein to your diet. ?Sometimes you may not feel like eating, even if you know the importance of good nutrition. The recommendations in this handout can help you with the following: ?Regaining your strength and energy ?Keeping your body  healthy ?Healing and recovering from surgery or illness and fighting infection ? ?Tips ?Schedule Your Meals and Snacks ?Several small meals and snacks are often better tolerated and digested than large meals. ?Strategies ?Plan to eat 3 meals and 3 snacks daily. ?Experiment with timing meals to find out when you have a larger appetite. ?Appetite may be greatest in the morning after not eating all night so you may prefer to eat your larger meals and snacks in the morning and at lunch. ?Breakfast-type foods are often better tolerated so eat foods such as eggs, pancakes, waffles and cereal for any meal or snack. ?Carry snacks with you so you are prepared to eat every 2 to 3 hours. ?Determine what works best for you if your body?s cues for feeling hungry or full are not working. ?Eat a small meal or snack even if you don?t feel hungry. ?Set a timer to remind you when it is time to eat. ?Take a walk before you eat (with health care provider?s approval). ?Light or moderate physical activity can help you maintain muscle and increase your appetite. ? ?Make Eating Enjoyable ?Taking steps to make the experience enjoyable may help to increase your interest in eating and improve your appetite. ?Strategies: ?Eat with others whenever possible. ?Include your favorite foods to make meals more enjoyable. ?Try new foods. ?Save your beverage for the end of the meal so  that you have more room for food before you get full. ? ?Add Calories to Your Meals and Snacks ?Try adding calorie-dense foods so that each bite provides more nutrition. ?Strategies ?Drink milk, chocolate milk, soy milk, or smoothies instead of low-calorie beverages such as diet drinks or water. ?Cook with milk or soy milk instead of water when making dishes such as hot cereal, cocoa, or pudding. ?Add jelly, jam, honey, butter or margarine to bread and crackers. Add jam or fruit to ice cream and as a topping over cake. ?Mix dried fruit, nuts, granola, honey, or dry cereal  with yogurt or hot cereals. ?Enjoy snacks such as milkshakes, smoothies, pudding, ice cream, or custard. ?Blend a fruit smoothie of a banana, frozen berries, milk or soy milk, and 1 tablespoon nonfat powdered milk or protein powder. ? ?Add Protein to Your Meals and Snacks ?Choose at least one protein food at each meal and snack to increase your daily intake. ?Strategies ?Add ? cup nonfat dry milk powder or protein powder to make a high-protein milk to drink or to use in recipes that call for milk. Vanilla or peppermint extract or unsweetened cocoa powder could help to boost the flavor. ?Add hard-cooked eggs, leftover meat, grated cheese, canned beans or tofu to noodles, rice, salads, sandwiches, soups, casseroles, pasta, tuna and other mixed dishes. ?Add powdered milk or protein powder to hot cereals, meatloaf, casseroles, scrambled eggs, sauces, cream soups, and shakes. ?Add beans and lentils to salads, soups, casseroles, and vegetable dishes. ?Eat cottage cheese or yogurt, especially [place] yogurt, with fruit as a snack or dessert. ?Eat peanut or other nut butters on crackers, bread, toast, waffles, apples, bananas or celery sticks. Add it to milkshakes, smoothies, or desserts. ?Consider a ready-made protein shake. Your RDN will make recommendations. ? ?Add Fats to Your Meals and Snacks ?Try adding fats to your meals and snacks. Fat provides more calories in fewer bites than carbohydrate or protein and adds flavors to your foods. ?Strategies ?Snack on nuts and seeds or add them to foods like salads, pasta, cereals, yogurt, and ice cream.  ?Saut? or stir-fry vegetables, meats, chicken, fish or tofu in olive or canola oil.  ?Add olive oil, other vegetable oils, butter or margarine to soups, vegetables, potatoes, cooked cereal, rice, pasta, bread, crackers, pancakes, or waffles. ?Snack on olives or add to pasta, pizza, or salad. ?Add avocado or guacamole to your salads, sandwiches, and other entrees. ?Include fatty  fish such as salmon in your weekly meal plan. ?For general food safety tips, especially for clients with immunocompromised conditions, ask your RDN for the Food Safety Nutrition Therapy handout. ? ?Small Meal and Snack Ideas ?These snacks and meals are recommended when you have to eat but aren?t necessarily hungry.  They are good choices because they are high in protein and high in calories.  ?2 graham crackers ?2 tablespoons peanut or other nut butter ?1 cup milk 2 slices whole wheat toast topped with: ?? avocado, mashed ?Seasoning of your choice  ?? cup [place] yogurt ?? cup fruit ?? cup granola 2 deviled egg halves ?5 whole wheat crackers  ?1 cup cream of tomato soup ?? grilled cheese sandwich 1 toasted waffle topped with: ?2 tablespoons peanut or nut butter ?1 tablespoon jam  ?Tra[place] mix made with: ? cup nuts ?? cup dried fruit ?? cup cold cereal, any variety ? cup oatmeal or cream of wheat cereal ?1 tablespoon peanut or nut butter ?? cup diced fruit  ? ?Copyright Academy of Nutrition  and Dietetics ?

## 2021-12-11 NOTE — Progress Notes (Signed)
Initial Nutrition Assessment ? ?DOCUMENTATION CODES:  ? ?Non-severe (moderate) malnutrition in context of chronic illness ? ?INTERVENTION:  ?- Boost Breeze po BID, each supplement provides 250 kcal and 9 grams of protein ? ?- [employer] Breakfast po TID - each supplement provides 140 kcal and 5g of protein with dairy free milk ? ?- MVI with minerals daily ? ?- Added discharge supplement recommendations to AVS ? ?- Added "High Calorie, High Protein Nutrition Therapy" to AVS ? ?NUTRITION DIAGNOSIS:  ? 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 ? ?GOAL:  ? ?Patient will meet greater than or equal to 90% of their needs ? ?MONITOR:  ? ?PO intake, Supplement acceptance, Diet advancement, Labs, Weight trends ? ?REASON FOR ASSESSMENT:  ? ?Malnutrition Screening Tool ?  ? ?ASSESSMENT:  ? ?Pt admitted with nausea and vomiting secondary to enteritis. 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 ? ?Pt with family present at beside. She reports feeling sleepy this morning but doing well overall. She states that she has had a poor appetite and has not been eating well and that her weight has been "up and down" since having COVID. She is wanting to speak with a Dietitian outpatient to help her gain some weight back. Pt's family reports that she has had an intolerance to cold items as side effect of chemotherapy. In addition, she has also had difficulty with constipation and diarrhea d/t chemo, for which she alternates between colace and imodium. She is taking Vitamin B12 at home but no MVI.  ? ?Pt's diet was advanced to full liquids this morning. She had grits and a popsicle of which she reports tolerating well.  ? ?Pt has tried Ensure before but does not enjoy these and reports having an intolerance  to dairy. She typically drinks coconut, almond or other dairy alternative milk products. Pt is agreeable to trying Boost Breeze as well as [employer] during admission to help with nutritional intake.  ? ?Discussed with family ways to optimize nutritional intake with smaller meals including eating 5-6 smaller meals daily, addition of nutrition supplements and addition of toppings/butter/sauces/oils to meals to increase nutrition content of smaller meals. Added "High Calorie, High Protein Nutrition Therapy" handout to AVS for reference when she is discharged.  ? ?Reviewed weight history. Pt has had a 19% weight loss within the last 7 months (since 05/11/22) which is clinically significant for time frame.   ? ?Medications: liquid MVI, sucralfate, IV NaCl ? ?Labs: reviewed ? ?NUTRITION - FOCUSED PHYSICAL EXAM: ? ?Flowsheet Row Most Recent Value  ?Orbital Region Mild depletion  ?Upper Arm Region Moderate depletion  ?Thoracic and Lumbar Region Mild depletion  ?Buccal Region Moderate depletion  ?Temple Region Mild depletion  ?Clavicle Bone Region Moderate depletion  ?Clavicle and Acromion Bone Region Moderate depletion  ?Scapular Bone Region Moderate depletion  ?Dorsal Hand Mild depletion  ?Patellar Region Moderate depletion  ?Anterior Thigh Region Moderate depletion  ?Posterior Calf Region Mild depletion  ?Edema (RD Assessment) None  ?Hair Reviewed  ?Eyes Reviewed  ?Mouth Reviewed  ?Skin Reviewed  ?Nails Reviewed  ? ?  ? ? ?Diet Order:   ?Diet Order   ? ?       ?  Diet full liquid Room service appropriate? Yes; Fluid consistency: Thin  Diet effective now       ?  ? ?  ?  ? ?  ? ? ?  EDUCATION NEEDS:  ? ?Education needs have been addressed ? ?Skin:  Skin Assessment: Reviewed RN Assessment ? ?Last BM:  5/1 (type 7) ? ?Height:  ? ?Ht Readings from Last 1 Encounters:  ?12/11/21 '5\' 4"'$  (1.626 m)  ? ? ?Weight:  ? ?Wt Readings from Last 1 Encounters:  ?12/11/21 54.6 kg  ? ?BMI:  Body mass index is 20.66  kg/m?. ? ?Estimated Nutritional Needs:  ? ?Kcal:  1700-1900 ? ?Protein:  85-100g ? ?Fluid:  >/=1.7L ? ?Clayborne Dana, RDN, LDN ?Clinical Nutrition ?

## 2021-12-11 NOTE — H&P (Signed)
?History and Physical  ? ? ?Kari Patel YWV:[id number] DOB: 06/21/1951 DOA: 12/10/2021 ? ?PCP: Pcp, No ? ?Patient coming from: Home ? ?Chief Complaint: Nausea, vomiting ? ?HPI: Kari Patel is a 71 y.o. 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 ? ?CT chest/abdomen/pelvis with contrast ordered by oncology on 12/08/2021 showing: ?"IMPRESSION: ?1. Progressive soft tissue nodularity in the lower abdomen, right ?greater than left, consistent with progressive metastatic disease. ?2. Ill-defined 9 mm hypodensity in the right hepatic lobe was not ?seen on the prior exam, suspicious for metastasis. ?3. Multiple pulmonary nodules, with largest nodule slightly ?increasing in size from prior exam. The remainder of the nodules are ?stable. Some of the nodules are now cavitary indicating treatment ?response. ?4. Wall thickening in the terminal ileum appears similar with stable ?adjacent soft tissue nodule. There is new wall thickening and ?perienteric edema in the distal ileum in the low pelvis that may ?represent enteritis. More proximal small bowel is prominent in ?caliber, and contrast does not reach the colon. Partial obstruction ?is considered. ?5. Trace perihepatic ascites, new. ?6. Slight decreased right hydronephrosis without hydroureter. ?7. Colonic diverticulosis without acute inflammation. ?8. Layering gallstones or sludge without inflammation. ?  ?Aortic Atherosclerosis (ICD10-I70.0)." ? ?Patient presents to the ED today complaining of nausea and vomiting.  No fever or leukocytosis.  Lipase and LFTs normal.  UA pending.  High-sensitivity troponin negative.  General surgery consulted by ED physician.  Patient was given Ativan, Zofran, and 1 L normal saline bolus. ? ?Patient reports 1 day history of nausea and vomiting.  Not tolerating p.o. intake.  Denies fevers, chills, or abdominal pain.  Reports history of diarrhea related to  chemotherapy but not having it at present.  No other complaints.  Denies cough, shortness of breath, or chest pain.  Denies any urinary symptoms. ? ?Review of Systems:  ?Review of Systems  ?All other systems reviewed and are negative. ? ?Past Medical History:  ?Diagnosis Date  ? Anemia   ? Colon cancer ([place])   ? cecum  ? Heart murmur   ? Hypertension   ? ? ?Past Surgical History:  ?Procedure Laterality Date  ? ABDOMINAL HYSTERECTOMY    ? IR IMAGING GUIDED PORT INSERTION  03/13/2021  ? ? ? reports that she quit smoking about 28 years ago. Her smoking use included cigarettes. She has a 2.50 pack-year smoking history. She has never used smokeless tobacco. She reports that she does not currently use alcohol. She reports that she does not use drugs. ? ?Allergies  ?Allergen Reactions  ? Demerol [Meperidine Hcl] Nausea Only  ? ? ?Family History  ?Problem Relation Age of Onset  ? Stroke Mother   ? Hypertension Mother   ? Stroke Father   ? Hypertension Father   ? ? ?Prior to Admission medications   ?Medication Sig Start Date End Date Taking? Authorizing Provider  ?amLODipine (NORVASC) 10 MG tablet Take 1 tablet (10 mg total) by mouth daily. 11/24/21  Yes [name], MD  ?KLOR-CON M20 20 MEQ tablet TAKE 1 TABLET BY MOUTH EVERY DAY ?Patient taking differently: Take 20 mEq by mouth daily as needed (only after having blood work done). 11/13/21  Yes [name], MD  ?lidocaine-prilocaine (EMLA) cream Apply 1 application. topically as needed. ?Patient taking differently: Apply 1 application. topically as needed (for port access). 10/18/21  Yes [name], MD  ?lisinopril (ZESTRIL) 5 MG tablet TAKE 1 TABLET (5 MG TOTAL) BY MOUTH DAILY. ?Patient  taking differently: Take 5 mg by mouth daily. 09/04/21  Yes [name], MD  ?loperamide (IMODIUM) 2 MG capsule Take 2 mg by mouth as needed for diarrhea or loose stools.   Yes [provider]  ?LORazepam (ATIVAN) 0.5 MG tablet Take 1 tablet (0.5 mg total) by mouth every 8 (eight) hours. ?Patient  taking differently: Take 0.5 mg by mouth every 8 (eight) hours as needed (Anxiety, Cancer Chemotherapy-Induced Nausea and Vomiting). 11/24/21  Yes Dede Query T, PA-C  ?ondansetron (ZOFRAN) 8 MG tablet Take 1 tablet (8 mg total) by mouth every 8 (eight) hours as needed for nausea or vomiting. 11/24/21  Yes [name], MD  ?sucralfate (CARAFATE) 1 g tablet TAKE 1 TABLET BY MOUTH 4 TIMES DAILY - WITH MEALS AND AT BEDTIME. ?Patient taking differently: Take 1 g by mouth 4 (four) times daily. WITH MEALS AND AT BEDTIME. 11/24/21  Yes [name], MD  ?diphenoxylate-atropine (LOMOTIL) 2.5-0.025 MG tablet Take 0.5-1 tablets by mouth 4 (four) times daily as needed for diarrhea or loose stools. ?Patient not taking: Reported on 12/11/2021 11/07/21   Alla Feeling, NP  ?pregabalin (LYRICA) 50 MG capsule Take 1 capsule (50 mg total) by mouth 2 (two) times daily. ?Patient not taking: Reported on 12/11/2021 11/24/21   [name], MD  ?prochlorperazine (COMPAZINE) 10 MG tablet Take 1 tablet (10 mg total) by mouth every 6 (six) hours as needed for nausea or vomiting. ?Patient not taking: Reported on 12/11/2021 03/14/21   [name], MD  ? ? ?Physical Exam: ?Vitals:  ? 12/11/21 0015 12/11/21 0030 12/11/21 0045 12/11/21 0121  ?BP: (!) 146/64 (!) 153/74 (!) 154/70 137/75  ?Pulse:    84  ?Resp: '17 17 18 18  '$ ?Temp:      ?TempSrc:    Oral  ?SpO2:    100%  ?Weight:    54.6 kg  ?Height:    '5\' 4"'$  (1.626 m)  ? ? ?Physical Exam ?Vitals reviewed.  ?Constitutional:   ?   General: She is not in acute distress. ?HENT:  ?   Mouth/Throat:  ?   Mouth: Mucous membranes are dry.  ?Eyes:  ?   Extraocular Movements: Extraocular movements intact.  ?Cardiovascular:  ?   Rate and Rhythm: Normal rate and regular rhythm.  ?   Pulses: Normal pulses.  ?Pulmonary:  ?   Effort: Pulmonary effort is normal. No respiratory distress.  ?   Breath sounds: Normal breath sounds.  ?Abdominal:  ?   General: Bowel sounds are normal. There is no distension.  ?   Palpations: Abdomen is  soft.  ?   Tenderness: There is no abdominal tenderness. There is no guarding.  ?Musculoskeletal:     ?   General: No swelling or tenderness.  ?   Cervical back: Normal range of motion.  ?Skin: ?   General: Skin is warm and dry.  ?Neurological:  ?   General: No focal deficit present.  ?   Mental Status: She is alert and oriented to person, place, and time.  ?  ? ?Labs on Admission: I have personally reviewed following labs and imaging studies ? ?CBC: ?Recent Labs  ?Lab 12/10/21 ?1830  ?WBC 5.3  ?HGB 13.7  ?HCT 41.9  ?MCV 97.7  ?PLT 203  ? ?Basic Metabolic Panel: ?Recent Labs  ?Lab 12/10/21 ?1830  ?NA 139  ?K 3.6  ?CL 101  ?CO2 27  ?GLUCOSE 125*  ?BUN 8  ?CREATININE 0.58  ?CALCIUM 10.1  ? ?GFR: ?Estimated Creatinine Clearance:  55.6 mL/min (by C-G formula based on SCr of 0.58 mg/dL). ?Liver Function Tests: ?Recent Labs  ?Lab 12/10/21 ?1830  ?AST 17  ?ALT 11  ?ALKPHOS 71  ?BILITOT 0.9  ?PROT 8.5*  ?ALBUMIN 4.5  ? ?Recent Labs  ?Lab 12/10/21 ?1830  ?LIPASE 26  ? ?No results for input(s): AMMONIA in the last 168 hours. ?Coagulation Profile: ?No results for input(s): INR, PROTIME in the last 168 hours. ?Cardiac Enzymes: ?No results for input(s): CKTOTAL, CKMB, CKMBINDEX, TROPONINI in the last 168 hours. ?BNP (last 3 results) ?No results for input(s): PROBNP in the last 8760 hours. ?HbA1C: ?No results for input(s): HGBA1C in the last 72 hours. ?CBG: ?No results for input(s): GLUCAP in the last 168 hours. ?Lipid Profile: ?No results for input(s): CHOL, HDL, LDLCALC, TRIG, CHOLHDL, LDLDIRECT in the last 72 hours. ?Thyroid Function Tests: ?No results for input(s): TSH, T4TOTAL, FREET4, T3FREE, THYROIDAB in the last 72 hours. ?Anemia Panel: ?No results for input(s): VITAMINB12, FOLATE, FERRITIN, TIBC, IRON, RETICCTPCT in the last 72 hours. ?Urine analysis: ?   ?Component Value Date/Time  ? PROTEINUR 30 (A) 11/24/2021 1034  ? ? ?Radiological Exams on Admission: I have personally reviewed images ?No results found. ? ?EKG:  Independently reviewed.  Sinus rhythm, no prior tracing for comparison. ? ?Assessment and Plan ? ?Enteritis ?Partial SBO ?Patient with metastatic colon cancer presenting with complaints of nausea and vomiting.  No

## 2021-12-12 DIAGNOSIS — I10 Essential (primary) hypertension: Secondary | ICD-10-CM

## 2021-12-12 DIAGNOSIS — K529 Noninfective gastroenteritis and colitis, unspecified: Secondary | ICD-10-CM | POA: Diagnosis not present

## 2021-12-12 DIAGNOSIS — C7800 Secondary malignant neoplasm of unspecified lung: Secondary | ICD-10-CM

## 2021-12-12 DIAGNOSIS — E440 Moderate protein-calorie malnutrition: Secondary | ICD-10-CM | POA: Diagnosis not present

## 2021-12-12 DIAGNOSIS — C787 Secondary malignant neoplasm of liver and intrahepatic bile duct: Secondary | ICD-10-CM

## 2021-12-12 LAB — MAGNESIUM: Magnesium: 1.8 mg/dL (ref 1.7–2.4)

## 2021-12-12 LAB — BASIC METABOLIC PANEL
Anion gap: 7 (ref 5–15)
BUN: 7 mg/dL — ABNORMAL LOW (ref 8–23)
CO2: 24 mmol/L (ref 22–32)
Calcium: 8.8 mg/dL — ABNORMAL LOW (ref 8.9–10.3)
Chloride: 106 mmol/L (ref 98–111)
Creatinine, Ser: 0.43 mg/dL — ABNORMAL LOW (ref 0.44–1.00)
GFR, Estimated: >60 mL/min (ref >60)
Glucose, Bld: 89 mg/dL (ref 70–99)
Potassium: 3.7 mmol/L (ref 3.5–5.1)
Sodium: 137 mmol/L (ref 135–145)

## 2021-12-12 MED ORDER — ONDANSETRON HCL 8 MG PO TABS: 8.0000 mg | ORAL_TABLET | Freq: Four times a day (QID) | ORAL | 1 refills | Status: DC | PRN

## 2021-12-12 MED ORDER — DULOXETINE HCL 20 MG PO CPEP: 20.0000 mg | ORAL_CAPSULE | Freq: Every day | ORAL | 3 refills | Status: AC

## 2021-12-12 MED ORDER — PANTOPRAZOLE SODIUM 40 MG PO TBEC: 40.0000 mg | DELAYED_RELEASE_TABLET | Freq: Every day | ORAL | 1 refills | Status: DC

## 2021-12-12 NOTE — Discharge Summary (Signed)
Physician Discharge Summary  ?Kari Patel ZOX:[id number] DOB: 19-Jun-1951 DOA: 12/10/2021 ? ?PCP: Pcp, No ? ?Admit date: 12/10/2021 ?Discharge date: 12/12/2021 ? ?Admitted From: Home ?Disposition: Home ? ?Recommendations for Outpatient Follow-up:  ?Follow up with PCP in 1-2 weeks ?Please obtain BMP/CBC in one week ?Plan to follow-up with oncology as an outpatient ? ? ?Discharge Condition: Stable ?CODE STATUS: Full code ?Diet recommendation: Heart healthy ? ?Brief/Interim Summary: ?71 year old female with a history of stage IV colon cancer, admitted to the hospital with partial small bowel obstruction.  She was followed by general surgery and oncology.  She was treated with conservative management, bowel rest, IV fluids and antiemetics.  Fortunately, it appeared that her obstruction resolved.  She was started on a diet with clear liquids and was subsequently advanced to solid food.  She did not have any recurrence of vomiting.  She continued to have frequent bowel movements.  She did not require any surgical intervention.  She was seen by oncology regarding her underlying malignancy and will plan on starting a new chemotherapy on follow-up.  She was also complaining of neuropathy and reported improvement of symptoms with Cymbalta.  Patient feels ready for discharge home. ? ?Discharge Diagnoses:  ?Principal Problem: ?  Enteritis ?Active Problems: ?  Cancer of right colon ([place]) ?  Partial small bowel obstruction ([place]) ?  HTN (hypertension) ?  Malnutrition of moderate degree ? ? ? ?Discharge Instructions ? ?Discharge Instructions   ? ? Diet - low sodium heart healthy   Complete by: As directed ?  ? Increase activity slowly   Complete by: As directed ?  ? ?  ? ?Allergies as of 12/12/2021   ? ?   Reactions  ? Demerol [meperidine Hcl] Nausea Only  ? ?  ? ?  ?Medication List  ?  ? ?STOP taking these medications   ? ?diphenoxylate-atropine 2.5-0.025 MG tablet ?Commonly known as: LOMOTIL ?  ?pregabalin 50 MG capsule ?Commonly known  as: Lyrica ?  ?prochlorperazine 10 MG tablet ?Commonly known as: COMPAZINE ?  ? ?  ? ?TAKE these medications   ? ?amLODipine 10 MG tablet ?Commonly known as: NORVASC ?Take 1 tablet (10 mg total) by mouth daily. ?  ?DULoxetine 20 MG capsule ?Commonly known as: CYMBALTA ?Take 1 capsule (20 mg total) by mouth daily. ?Start taking on: Dec 13, 2021 ?  ?Klor-Con M20 20 MEQ tablet ?Generic drug: potassium chloride SA ?TAKE 1 TABLET BY MOUTH EVERY DAY ?What changed:  ?how much to take ?when to take this ?reasons to take this ?  ?lidocaine-prilocaine cream ?Commonly known as: EMLA ?Apply 1 application. topically as needed. ?What changed: reasons to take this ?  ?lisinopril 5 MG tablet ?Commonly known as: ZESTRIL ?TAKE 1 TABLET (5 MG TOTAL) BY MOUTH DAILY. ?  ?loperamide 2 MG capsule ?Commonly known as: IMODIUM ?Take 2 mg by mouth as needed for diarrhea or loose stools. ?  ?LORazepam 0.5 MG tablet ?Commonly known as: ATIVAN ?Take 1 tablet (0.5 mg total) by mouth every 8 (eight) hours. ?What changed:  ?when to take this ?reasons to take this ?  ?ondansetron 8 MG tablet ?Commonly known as: ZOFRAN ?Take 1 tablet (8 mg total) by mouth every 6 (six) hours as needed for nausea or vomiting. ?What changed: when to take this ?  ?pantoprazole 40 MG tablet ?Commonly known as: Protonix ?Take 1 tablet (40 mg total) by mouth daily. ?  ?sucralfate 1 g tablet ?Commonly known as: CARAFATE ?TAKE 1 TABLET BY MOUTH 4 TIMES DAILY -  WITH MEALS AND AT BEDTIME. ?What changed:  ?how much to take ?how to take this ?when to take this ?additional instructions ?  ? ?  ? ? ?Allergies  ?Allergen Reactions  ? Demerol [Meperidine Hcl] Nausea Only  ? ? ?Consultations: ?General surgery ?Oncology ? ? ?Procedures/Studies: ?CT CHEST ABDOMEN PELVIS W CONTRAST ? ?Result Date: 12/10/2021 ?CLINICAL DATA:  Colon cancer restaging. EXAM: CT CHEST, ABDOMEN, AND PELVIS WITH CONTRAST TECHNIQUE: Multidetector CT imaging of the chest, abdomen and pelvis was performed following  the standard protocol during bolus administration of intravenous contrast. RADIATION DOSE REDUCTION: This exam was performed according to the departmental dose-optimization program which includes automated exposure control, adjustment of the mA and/or kV according to patient size and/or use of iterative reconstruction technique. CONTRAST:  62m OMNIPAQUE IOHEXOL 300 MG/ML  SOLN COMPARISON:  CT 09/15/2021 FINDINGS: CT CHEST FINDINGS Cardiovascular: Atherosclerosis of the thoracic aorta. Normal heart size. Coronary artery calcifications. Right chest port in place. No pericardial effusion. Mediastinum/Nodes: No enlarged mediastinal, hilar, or axillary lymph nodes. No supraclavicular adenopathy. 9 mm left thyroid nodule. Not clinically significant; no follow-up imaging recommended (ref: J Am Coll Radiol. 2015 Feb;12(2): 143-50).Minimal wall thickening of the gastroesophageal junction. Lungs/Pleura: Multiple pulmonary nodules, dominant nodules are as follows. -6 mm right upper lobe, series 6, image 26, previously 6 mm -5 mm subpleural left upper lobe series 6, image 33, previously 5 mm -4 mm anterior right upper lobe series 6, image 48, unchanged in size but now cavitary -13 x 10 mm left upper lobe anteriorly series 6, image 62, previously 11 x 7 mm -6 mm right lower lobe series 6, image 69, previously 6 mm There are multiple additional millimetric nodules ladder not significantly changed. No confluent consolidation. No pleural effusion. Trachea and central bronchi are patent. Musculoskeletal: No destructive lytic or blastic osseous lesions. Stable osseous structures. Vascular calcifications in the anterior chest wall. No chest wall soft tissue lesions. CT ABDOMEN PELVIS FINDINGS Hepatobiliary: There is a subtle 9 mm hypodensity in the right hepatic lobe, series 2, image 38. This was not seen on the prior exam layering sludge or stones in the gallbladder. No pericholecystic inflammation. No biliary dilatation. Pancreas:  No ductal dilatation or inflammation. No evidence of pancreatic mass. Spleen: Normal in size without focal abnormality. Adrenals/Urinary Tract: No adrenal nodule. Slight decreased right hydronephrosis without hydroureter. There is homogeneous enhancement. Slight decreased right renal excretion. Focal renal mass. Partially distended, unremarkable urinary bladder Stomach/Bowel: The stomach is unremarkable. Small bowel is prominent in caliber measuring up to 2.6 cm, enteric contrast is not reached the colon. There is a moderate length of distal ileum in the pelvis with demonstrates wall thickening and edema, series 2, image 97. the soft tissue nodule adjacent to the terminal ileum is difficult to accurately measure as the adjacent bowel is unopacified, however measures approximately 3.2 x 2 cm, unchanged, series 2, image 93. Thickening in the region of the terminal ileum persists. Moderate volume of colonic stool with diffuse colonic diverticulosis, prominent in the sigmoid. There is no evidence of diverticulitis. Vascular/Lymphatic: Aortic atherosclerosis. Patent portal, splenic, and mesenteric veins. Hepatic duodenal node measures 11 mm, series 2, image 56, previously 10 mm, but subjectively unchanged. There is a 6 mm central mesenteric node, series 2, image 78, not definitively seen on prior. Occasional additional prominent central mesenteric nodes. Reproductive: The uterus is surgically absent.  No adnexal mass. Other: Ill-defined soft tissue nodularity in the lower abdomen, right greater than left, appears progressed in the interim.  Representative left anterior omental lesion measures 3.7 x 1.4 cm, series 2, image 91, grossly unchanged in size but increased in density. Abdominal anterior soft tissue nodule measures 8 mm, series 2, image 71, previously 6 mm. There is trace perihepatic ascites that is new. There is a small umbilical hernia containing fat and omental nodularity. Musculoskeletal: Stable punctate L1  bone island. No lytic or destructive osseous lesion. Degenerative subchondral cyst in the right acetabulum. No acute osseous findings. IMPRESSION: 1. Progressive soft tissue nodularity in the lower abd

## 2021-12-12 NOTE — Progress Notes (Signed)
? ? ?   ?Subjective: ?CC: ?Did well with fld yesterday. Had some mild cramping/bloating + hiccups after dinner that resolved and has not recurred. No abdominal pain, bloating, burping/belching or n/v this am. Passing flatus. Had 4 bm yesterday, some formed & some liquidy. Large episode of diarrhea this am. About to try soft diet for breakfast.  ? ?Objective: ?Vital signs in last 24 hours: ?Temp:  [98.4 ?F (36.9 ?C)-100.3 ?F (37.9 ?C)] 99.1 ?F (37.3 ?C) (05/02 0450) ?Pulse Rate:  [70-79] 70 (05/02 0450) ?Resp:  [14-18] 18 (05/02 0450) ?BP: (123-149)/(57-74) 149/74 (05/02 0450) ?SpO2:  [99 %-100 %] 100 % (05/02 0450) ?Last BM Date : 12/11/21 ? ?Intake/Output from previous day: ?05/01 0701 - 05/02 0700 ?In: 1057.8 [I.V.:1057.8] ?Out: -  ?Intake/Output this shift: ?No intake/output data recorded. ? ?PE: ?Gen:  Alert, NAD, pleasant ?Pulm:  rate and effort normal ?Abd: Soft, ND, NT +BS ?Psych: A&Ox3  ? ?Lab Results:  ?Recent Labs  ?  12/10/21 ?1830 12/11/21 ?1619  ?WBC 5.3 5.0  ?HGB 13.7 10.4*  ?HCT 41.9 32.2*  ?PLT 203 139*  ? ?BMET ?Recent Labs  ?  12/11/21 ?1619 12/12/21 ?0446  ?NA 136 137  ?K 3.4* 3.7  ?CL 106 106  ?CO2 22 24  ?GLUCOSE 93 89  ?BUN 10 7*  ?CREATININE 0.61 0.43*  ?CALCIUM 8.2* 8.8*  ? ?PT/INR ?No results for input(s): LABPROT, INR in the last 72 hours. ?CMP  ?   ?Component Value Date/Time  ? NA 137 12/12/2021 0446  ? K 3.7 12/12/2021 0446  ? CL 106 12/12/2021 0446  ? CO2 24 12/12/2021 0446  ? GLUCOSE 89 12/12/2021 0446  ? BUN 7 (L) 12/12/2021 0446  ? CREATININE 0.43 (L) 12/12/2021 0446  ? CREATININE 0.55 11/24/2021 1034  ? CALCIUM 8.8 (L) 12/12/2021 0446  ? PROT 8.5 (H) 12/10/2021 1830  ? ALBUMIN 4.5 12/10/2021 1830  ? AST 17 12/10/2021 1830  ? AST 12 (L) 11/24/2021 1034  ? ALT 11 12/10/2021 1830  ? ALT 6 11/24/2021 1034  ? ALKPHOS 71 12/10/2021 1830  ? BILITOT 0.9 12/10/2021 1830  ? BILITOT 0.4 11/24/2021 1034  ? GFRNONAA >60 12/12/2021 0446  ? GFRNONAA >60 11/24/2021 1034  ? ?Lipase  ?   ?Component  Value Date/Time  ? LIPASE 26 12/10/2021 1830  ? ? ?Studies/Results: ?DG CHEST PORT 1 VIEW ? ?Result Date: 12/11/2021 ?CLINICAL DATA:  Fever.  Metastatic colon cancer. EXAM: PORTABLE CHEST 1 VIEW COMPARISON:  CT chest 12/08/2021 FINDINGS: Right chest wall porta catheter tip overlies the superior aspect of the superior vena cava. Cardiac silhouette and mediastinal contours are within normal limits with mild calcification within aortic arch. A skin fold overlies the superior and lateral aspects of the left lung, with vascular marking seen peripheral to this no definite pneumothorax seen. The lungs are clear. Note is made of multiple bilateral pulmonary nodule seen on recent CT that are not as well visualized on radiographs, including right lung apex 6 mm anteromedial left upper lung 13 mm pulmonary nodules. No pleural effusion or pneumothorax. No acute skeletal abnormality. IMPRESSION:: IMPRESSION: 1. No acute lung process. 2. Multiple bilateral pulmonary nodules measuring up to 13 mm on the left and 6 mm on the right are better seen on prior CT. Electronically Signed   By: Yvonne Kendall M.D.   On: 12/11/2021 10:27  ? ?DG Abd Portable 1V ? ?Result Date: 12/11/2021 ?CLINICAL DATA:  Small-bowel obstruction.  History of colon cancer. EXAM: PORTABLE ABDOMEN - 1  VIEW COMPARISON:  Abdominopelvic CT 12/08/2021 FINDINGS: The bowel gas pattern is nonobstructive. There is no supine evidence of free intraperitoneal air or bowel wall thickening. Residual contrast is noted throughout colonic diverticula. There is new patchy airspace disease at the right lung base with a possible associated small right pleural effusion. Mild convex right lumbar scoliosis. Telemetry leads overlie the abdomen. IMPRESSION: Nonobstructive bowel gas pattern. New opacity overlying the right lung base suspicious for basilar infiltrate or atelectasis and a possible small right pleural effusion. Electronically Signed   By: Richardean Sale M.D.   On: 12/11/2021  08:00   ? ?Anti-infectives: ?Anti-infectives (From admission, onward)  ? ? None  ? ?  ? ? ? ?Assessment/Plan ?pSBO ?Hx of Metastatic R colon CA Stage IV (cTX, cN1, cM1) dx in April 2022 who is followed by [name] and currently on Chemo (irinotecan/beva - last infusion 4/14)  ?- Clinically and radiographically resolving. Xray 5/1 with nonobstructive bowel gas pattern and p.o. contrast from CT on admission had passed into the colon. Some bloating with FLD yesterday that has since resolved. She has ROBF. No abdominal pain, nausea/vomiting this am and she is ND/NT on exam. If tolerates soft diet, she can be d/c'd from our standpoint. Will reach out to [facility] w/ recommendations.  ?  ?To note - CT also showed disease progression. Oncology has seen in house.  ?  ?FEN - Soft ?VTE - SCDs, okay for chemical prophylaxis from a general surgery standpoint ?ID - None ?  ?HTN ? ? LOS: 0 days  ? ? ?Jillyn Ledger , PA-C ?[facility] ?12/12/2021, 9:17 AM ?Please see Amion for pager number during day hours 7:00am-4:30pm ? ?

## 2021-12-12 NOTE — Plan of Care (Signed)
?  Problem: Clinical Measurements: ?Goal: Diagnostic test results will improve ?Outcome: Progressing ?  ?Problem: Activity: ?Goal: Risk for activity intolerance will decrease ?Outcome: Progressing ?  ?Problem: Nutrition: ?Goal: Adequate nutrition will be maintained ?Outcome: Progressing ?  ?Problem: Coping: ?Goal: Level of anxiety will decrease ?Outcome: Progressing ?  ?Problem: Education: ?Goal: Knowledge of General Education information will improve ?Description: Including pain rating scale, medication(s)/side effects and non-pharmacologic comfort measures ?Outcome: Completed/Met ?  ?Problem: Clinical Measurements: ?Goal: Respiratory complications will improve ?Outcome: Completed/Met ?Goal: Cardiovascular complication will be avoided ?Outcome: Completed/Met ?  ?Problem: Elimination: ?Goal: Will not experience complications related to urinary retention ?Outcome: Completed/Met ?  ?Problem: Pain Managment: ?Goal: General experience of comfort will improve ?Outcome: Completed/Met ?  ?Problem: Safety: ?Goal: Ability to remain free from injury will improve ?Outcome: Completed/Met ?  ?Problem: Skin Integrity: ?Goal: Risk for impaired skin integrity will decrease ?Outcome: Completed/Met ?  ?

## 2021-12-14 ENCOUNTER — Other Ambulatory Visit: Payer: Self-pay | Admitting: Hematology

## 2021-12-14 ENCOUNTER — Other Ambulatory Visit: Payer: Self-pay

## 2021-12-14 ENCOUNTER — Telehealth: Payer: Self-pay

## 2021-12-14 DIAGNOSIS — C182 Malignant neoplasm of ascending colon: Secondary | ICD-10-CM

## 2021-12-14 MED ORDER — LORAZEPAM 0.5 MG PO TABS: 0.5000 mg | ORAL_TABLET | Freq: Three times a day (TID) | ORAL | 0 refills | Status: AC | PRN

## 2021-12-14 NOTE — Telephone Encounter (Signed)
Pt LVM requesting refill on Ativan.  Pt stated she never got her refill from last month because it was sent to the wrong location.  Pt is requesting that the refill be sent to [facility] on [address]. [place], [place]. Sent Patient Call message to [name]. ?

## 2021-12-15 ENCOUNTER — Inpatient Hospital Stay: Payer: Medicare HMO | Admitting: Hematology

## 2021-12-15 ENCOUNTER — Inpatient Hospital Stay: Payer: Medicare HMO

## 2021-12-20 ENCOUNTER — Other Ambulatory Visit: Payer: Self-pay | Admitting: Hematology

## 2021-12-20 ENCOUNTER — Other Ambulatory Visit: Payer: Self-pay

## 2021-12-27 ENCOUNTER — Telehealth: Payer: Self-pay | Admitting: Pharmacy Technician

## 2021-12-27 ENCOUNTER — Other Ambulatory Visit: Payer: Self-pay | Admitting: Hematology

## 2021-12-27 ENCOUNTER — Telehealth: Payer: Self-pay | Admitting: Pharmacist

## 2021-12-27 ENCOUNTER — Other Ambulatory Visit (HOSPITAL_COMMUNITY): Payer: Self-pay

## 2021-12-27 MED ORDER — LONSURF 20-8.19 MG PO TABS
35.0000 mg/m2 | ORAL_TABLET | Freq: Two times a day (BID) | ORAL | 1 refills | Status: DC
  Filled 2021-12-27: qty 60, 10d supply, fill #0

## 2021-12-27 NOTE — Telephone Encounter (Addendum)
Oral Oncology Pharmacist Encounter  Received new prescription for Lonsurf (trifluridine-tipiracil) for the treatment of metastatic colon cancer in conjunction with bevacizumab, planned duration until disease progression or unacceptable drug toxicity.  BMP and CBC from 12/12/21 assessed, noted pt with pltc of 139 K/uL - no baseline dose reductions required at this time. Prescription dose and frequency assessed for appropriateness.   Current medication list in Epic reviewed, no relevant/significant DDIs with Lonsurf identified.  Evaluated chart and no patient barriers to medication adherence noted.   Patient agreement for treatment documented in MD note on 12/11/21.  Due to high unaffordable copay, will have to proceed with obtaining manufacturer assistance for Lonsurf. [name] aware patient will not have [place] in hand to start as originally anticipated on 01/01/22.  Oral Oncology Clinic will continue to follow for insurance authorization, copayment issues, initial counseling and start date.  Leron Croak, PharmD, BCPS Hematology/Oncology Clinical Pharmacist [facility] [place] [phone] 12/27/2021 4:19 PM

## 2021-12-27 NOTE — Telephone Encounter (Signed)
Oral Oncology Patient Advocate Encounter ?  ?Received notification that prior authorization for Lonsurf is required. ?  ?PA submitted on 12/27/2021 ?Key [id number] ?Status is pending ?   ? ?Lady Deutscher, CPhT-Adv ?Pharmacy Patient Advocate Specialist ?[facility] Patient Advocate Team ?Direct Number: [phone]  Fax: [phone] ? ?

## 2021-12-28 ENCOUNTER — Telehealth: Payer: Self-pay | Admitting: Pharmacy Technician

## 2021-12-28 ENCOUNTER — Other Ambulatory Visit (HOSPITAL_COMMUNITY): Payer: Self-pay

## 2021-12-28 MED FILL — Dexamethasone Sodium Phosphate Inj 100 MG/10ML: INTRAMUSCULAR | Qty: 1 | Status: AC

## 2021-12-28 NOTE — Telephone Encounter (Signed)
Oral Oncology Patient Advocate Encounter  Prior Authorization for Kari Patel has been approved.    PA#  [id number] Effective dates: 12/28/2021 through 08/12/2022  Patients co-pay is $3,359.03.     Lady Deutscher, CPhT-Adv Pharmacy Patient [place] Patient Advocate Team Direct Number: [phone]  Fax: [phone]

## 2021-12-28 NOTE — Telephone Encounter (Signed)
Oral Oncology Patient Advocate Encounter  Began application for assistance for Lonsurf through [place] Patient Support.  Application will be submitted upon completion of necessary supporting documentation.  [facility] Patient Support phone number [phone].  I will continue to check the status until final determination.  Lady Deutscher, CPhT-Adv Pharmacy Patient Advocate Specialist [place] Patient Advocate Team Direct Number: [phone]  Fax: [phone]

## 2021-12-29 ENCOUNTER — Encounter: Payer: Self-pay | Admitting: Hematology

## 2021-12-29 ENCOUNTER — Inpatient Hospital Stay: Payer: Medicare HMO

## 2021-12-29 ENCOUNTER — Inpatient Hospital Stay (HOSPITAL_BASED_OUTPATIENT_CLINIC_OR_DEPARTMENT_OTHER): Payer: Medicare HMO | Admitting: Hematology

## 2021-12-29 ENCOUNTER — Other Ambulatory Visit (HOSPITAL_COMMUNITY): Payer: Self-pay

## 2021-12-29 ENCOUNTER — Other Ambulatory Visit: Payer: Self-pay

## 2021-12-29 ENCOUNTER — Inpatient Hospital Stay: Payer: Medicare HMO | Attending: Hematology

## 2021-12-29 VITALS — HR 100

## 2021-12-29 VITALS — BP 155/98 | HR 114 | Temp 98.0°F | Wt 110.9 lb

## 2021-12-29 DIAGNOSIS — D500 Iron deficiency anemia secondary to blood loss (chronic): Secondary | ICD-10-CM

## 2021-12-29 DIAGNOSIS — D509 Iron deficiency anemia, unspecified: Secondary | ICD-10-CM | POA: Diagnosis not present

## 2021-12-29 DIAGNOSIS — Z5112 Encounter for antineoplastic immunotherapy: Secondary | ICD-10-CM | POA: Insufficient documentation

## 2021-12-29 DIAGNOSIS — C182 Malignant neoplasm of ascending colon: Secondary | ICD-10-CM

## 2021-12-29 DIAGNOSIS — G620 Drug-induced polyneuropathy: Secondary | ICD-10-CM | POA: Diagnosis not present

## 2021-12-29 DIAGNOSIS — Z95828 Presence of other vascular implants and grafts: Secondary | ICD-10-CM

## 2021-12-29 DIAGNOSIS — C180 Malignant neoplasm of cecum: Secondary | ICD-10-CM | POA: Diagnosis present

## 2021-12-29 DIAGNOSIS — C786 Secondary malignant neoplasm of retroperitoneum and peritoneum: Secondary | ICD-10-CM | POA: Insufficient documentation

## 2021-12-29 LAB — CMP (CANCER CENTER ONLY)
ALT: 8 U/L (ref 0–44)
AST: 12 U/L — ABNORMAL LOW (ref 15–41)
Albumin: 4.2 g/dL (ref 3.5–5.0)
Alkaline Phosphatase: 64 U/L (ref 38–126)
Anion gap: 13 (ref 5–15)
BUN: 9 mg/dL (ref 8–23)
CO2: 27 mmol/L (ref 22–32)
Calcium: 10 mg/dL (ref 8.9–10.3)
Chloride: 96 mmol/L — ABNORMAL LOW (ref 98–111)
Creatinine: 0.6 mg/dL (ref 0.44–1.00)
GFR, Estimated: >60 mL/min (ref >60)
Glucose, Bld: 94 mg/dL (ref 70–99)
Potassium: 3.8 mmol/L (ref 3.5–5.1)
Sodium: 136 mmol/L (ref 135–145)
Total Bilirubin: 0.9 mg/dL (ref 0.3–1.2)
Total Protein: 8.5 g/dL — ABNORMAL HIGH (ref 6.5–8.1)

## 2021-12-29 LAB — CBC WITH DIFFERENTIAL (CANCER CENTER ONLY)
Abs Immature Granulocytes: 0.01 10*3/uL (ref 0.00–0.07)
Basophils Absolute: 0 10*3/uL (ref 0.0–0.1)
Basophils Relative: 1 %
Eosinophils Absolute: 0.1 10*3/uL (ref 0.0–0.5)
Eosinophils Relative: 2 %
HCT: 36.8 % (ref 36.0–46.0)
Hemoglobin: 12.8 g/dL (ref 12.0–15.0)
Immature Granulocytes: 0 %
Lymphocytes Relative: 20 %
Lymphs Abs: 0.7 10*3/uL (ref 0.7–4.0)
MCH: 32 pg (ref 26.0–34.0)
MCHC: 34.8 g/dL (ref 30.0–36.0)
MCV: 92 fL (ref 80.0–100.0)
Monocytes Absolute: 0.4 10*3/uL (ref 0.1–1.0)
Monocytes Relative: 13 %
Neutro Abs: 2.1 10*3/uL (ref 1.7–7.7)
Neutrophils Relative %: 64 %
Platelet Count: 252 10*3/uL (ref 150–400)
RBC: 4 MIL/uL (ref 3.87–5.11)
RDW: 13.2 % (ref 11.5–15.5)
WBC Count: 3.3 10*3/uL — ABNORMAL LOW (ref 4.0–10.5)
nRBC: 0 % (ref 0.0–0.2)

## 2021-12-29 LAB — IRON AND IRON BINDING CAPACITY (CC-WL,HP ONLY)
Iron: 38 ug/dL (ref 28–170)
Saturation Ratios: 13 % (ref 10.4–31.8)
TIBC: 287 ug/dL (ref 250–450)
UIBC: 249 ug/dL

## 2021-12-29 LAB — CEA (IN HOUSE-CHCC): CEA (CHCC-In House): 5.56 ng/mL — ABNORMAL HIGH (ref 0.00–5.00)

## 2021-12-29 MED ORDER — SODIUM CHLORIDE 0.9 % IV SOLN: Freq: Once | INTRAVENOUS | Status: AC

## 2021-12-29 MED ORDER — SODIUM CHLORIDE 0.9 % IV SOLN
7.5000 mg/kg | Freq: Once | INTRAVENOUS | Status: AC
  Administered 2021-12-29: 400 mg via INTRAVENOUS
  Filled 2021-12-29: qty 16

## 2021-12-29 MED ORDER — SODIUM CHLORIDE 0.9% FLUSH
10.0000 mL | Freq: Once | INTRAVENOUS | Status: AC
  Administered 2021-12-29: 10 mL

## 2021-12-29 MED ORDER — HEPARIN SOD (PORK) LOCK FLUSH 100 UNIT/ML IV SOLN
500.0000 [IU] | Freq: Once | INTRAVENOUS | Status: AC | PRN
  Administered 2021-12-29: 500 [IU]

## 2021-12-29 MED ORDER — LACTULOSE 10 GM/15ML PO SOLN: 20.0000 g | ORAL | 0 refills | Status: AC | PRN

## 2021-12-29 MED ORDER — SODIUM CHLORIDE 0.9% FLUSH
10.0000 mL | INTRAVENOUS | Status: DC | PRN
  Administered 2021-12-29: 10 mL

## 2021-12-29 NOTE — Patient Instructions (Signed)
[facility] MEDICAL ONCOLOGY  Discharge Instructions: °Thank you for choosing [facility] to provide your oncology and hematology care.  ° °If you have a lab appointment with the Cancer Center, please go directly to the Cancer Center and check in at the registration area. °  °Wear comfortable clothing and clothing appropriate for easy access to any Portacath or PICC line.  ° °We strive to give you quality time with your provider. You may need to reschedule your appointment if you arrive late (15 or more minutes).  Arriving late affects you and other patients whose appointments are after yours.  Also, if you miss three or more appointments without notifying the office, you may be dismissed from the clinic at the provider’s discretion.    °  °For prescription refill requests, have your pharmacy contact our office and allow 72 hours for refills to be completed.   ° °Today you received the following chemotherapy and/or immunotherapy agents: Bevacizumab.     °  °To help prevent nausea and vomiting after your treatment, we encourage you to take your nausea medication as directed. ° °BELOW ARE SYMPTOMS THAT SHOULD BE REPORTED IMMEDIATELY: °*FEVER GREATER THAN 100.4 F (38 °C) OR HIGHER °*CHILLS OR SWEATING °*NAUSEA AND VOMITING THAT IS NOT CONTROLLED WITH YOUR NAUSEA MEDICATION °*UNUSUAL SHORTNESS OF BREATH °*UNUSUAL BRUISING OR BLEEDING °*URINARY PROBLEMS (pain or burning when urinating, or frequent urination) °*BOWEL PROBLEMS (unusual diarrhea, constipation, pain near the anus) °TENDERNESS IN MOUTH AND THROAT WITH OR WITHOUT PRESENCE OF ULCERS (sore throat, sores in mouth, or a toothache) °UNUSUAL RASH, SWELLING OR PAIN  °UNUSUAL VAGINAL DISCHARGE OR ITCHING  ° °Items with * indicate a potential emergency and should be followed up as soon as possible or go to the Emergency Department if any problems should occur. ° °Please show the CHEMOTHERAPY ALERT CARD or IMMUNOTHERAPY ALERT CARD at check-in  to the Emergency Department and triage nurse. ° °Should you have questions after your visit or need to cancel or reschedule your appointment, please contact [facility]ICAL [facility]COL[facility]  Dept: [phone]  and follow the prompts.  Office hours are 8:00 a.m. to 4:30 p.m. Monday - Friday. Please note that voicemails left after 4:00 p.m. may not be returned until the following business day.  We are closed weekends and major holidays. You have access to a nurse at all times for urgent questions. Please call the main number to the clinic Dept: [phone] and follow the prompts. ° ° °For any non-urgent questions, you may also contact your provider using MyChart. We now offer e-Visits for anyone 18 and older to request care online for non-urgent symptoms. For details visit [email or web address]. °  °Also download the MyChart app! Go to the app store, search "MyChart", open the app, select [facility], and log in with your MyChart username and password. ° °Due to Covid, a mask is required upon entering the hospital/clinic. If you do not have a mask, one will be given to you upon arrival. For doctor visits, patients may have 1 support person aged 18 or older with them. For treatment visits, patients cannot have anyone with them due to current Covid guidelines and our immunocompromised population.  ° °

## 2021-12-29 NOTE — Progress Notes (Addendum)
[place]   [phone] [phone]   Clinic Follow up Note   Patient Care Team: Pcp, No as PCP - General [name], MD as Consulting Physician (Oncology) [name], RN as Nurse Navigator (Oncology)  Date of Service:  12/29/2021  CHIEF COMPLAINT: f/u of metastatic colon cancer  CURRENT THERAPY:  -Bevacizumab, q2weeks, started 03/29/21 -to start [place]:  Kari Patel is a 71 y.o. female with   1. 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 Work up with EGD and colonoscopy on 12/07/20 revealed a large cecal mass. Biopsy confirmed adenocarcinoma.  -repeat CT CAP on 03/10/21 showing: 7.1 cm cecal mass; extensive peritoneal/omental nodularity; mildly enlarged retroperitoneal and mesenteric lymph nodes; numerous bilateral pulmonary nodules; mild right-sided hydronephrosis. -Omental biopsy 03/13/21 confirmed metastatic adenocarcinoma, consistent with colon primary.   -port placed on 03/13/21, began first line FOLFOX 03/16/21, tolerated well with bowel disturbances. Oxali eventually discontinued due to neuropathy. -FO showed MSI stable disease, low mutation burden 4 muts/Mb, K-ras positive, bevacizumab was added with C2 (03/29/21) -restaging CT CAP 09/15/21 showed mild progression in the lungs and omentum. -we switched her to irinotecan/beva on 09/21/21. She tolerated fairly overall with diarrhea. -restaging CT CAP on 12/08/21 showed progressive soft tissue nodularity in lower abdomen, R>L; new 9 mm hypodensity in right hepatic lobe; slight increase in size of largest pulmonary nodules, some show treatment response; stable wall thickening of terminal ileum; new wall thickening and periecteric edema in distal ileum, contrast could not reach colon. She was subsequently admitted on 12/10/21 for partial obstruction and enteritis. -we reviewed her recent CT scan results  again today. We discussed switching to [place] with continued beva.  I reviewed the potential benefit and side effect of this regiment, she voiced good understanding and agrees to proceed.  Our oral pharmacist Wells Guiles will reach out to her for [place] drug assistance and education, plan to start on 5/29. -I had a frank discussion about the overall poor prognosis of her metastatic cancer, due to the recent cancer progression and decreased performance status.  She voiced a good understanding, she would like to continue cancer treatment at this point.   2. Symptom Management: constipation and recent SBO -She was recently hospitalized for partial small bowel obstruction, secondary to peritoneal metastasis.  This has resolved. -I recommend her to take low fiber diet. -She has intermittent constipation -she tried miralax with no relief. I called in lactulose   3. 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 Previously on gabapentin but d/c due to worsening diarrhea or abdominal cramps.  -currently on Cymbalta   4. Anemia, iron deficiency  -Likely secondary to #1 -She received IV iron Venofer 200 mg in 03/2021, developed constipation. -hgb back to WNL today (12/29/21), iron panel pending.   5. Goal of care discussion  -She understands the incurable nature of her stage IV cancer diagnosis  -Treatment goal is palliative, to control her disease, improve her symptoms, and prolong her life -she is full code now      PLAN -proceed with bevacizumab alone today at 7.$RemoveBe'5mg'ireTcjfeC$ /m2 -start [place], I called in 12/27/21, we I working on drug assistance, hopefully we can get it next week, and plan to start on May 29 -lab, flush, f/u, and beva on 6/12 and 6/26 -I called in lactulose for her constipation   No problem-specific Assessment & Plan notes  found for this encounter.   SUMMARY OF ONCOLOGIC HISTORY: Oncology History Overview Note  Cancer  Staging Cancer of right colon [facility]) Staging form: Colon and Rectum - Neuroendocine Tumors, AJCC 8th Edition - Clinical stage from 12/13/2020: Stage IV (cTX, cN1, cM1) - Signed by [name], MD on 03/01/2021    Cancer of right colon [facility])  12/07/2020 Procedure   Colonoscopy  Impression: - non-bleeding internal hemorrhoids - diverticulosis in entire examined colon - one 9 mm polyp in ascending colon removed with hot snare - one 13 mm polyp in transverse colon removed with hot fnare - likely malignant partially-obstructing tumor in cecum.   12/07/2020 Pathology Results   FINAL PATHOLOGIC DIAGNOSIS  SMALL BOWEL, BIOPSIES - duodenal mucosa within normal limits GASTRIC BODY AND ATRIUM, BIOPSY - gastric antral and body-type mucosa with moderate chronic Helicobacter pylori gastritis ASCENDING COLON, POLYPECTOMY - tubular adenoma with prominent eosinophilic infiltrate CECUM, "MASS," BIOPSIES - moderately differentiated adenocarcinoma TRANSVERSE COLON, POLYPECTOMY - high grade dysplasia involving tubulovillous adenoma - cauterized margins negative   12/13/2020 Cancer Staging   Staging form: Colon and Rectum - Neuroendocine Tumors, AJCC 8th Edition - Clinical stage from 12/13/2020: Stage IV (cTX, cN1, cM1) - Signed by [name], MD on 03/01/2021    12/22/2020 Imaging   CT CAP  IMPRESSION: Large 7.5 cm cecal mass with surrounding nodularity/lymphadenopathy 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 The largest measures 6 mm in the RUL. Findings are nonspecific but can be followed per Fleischner criteria Hiatal hernia   03/01/2021 Initial Diagnosis   Cancer of right colon ([place])    03/10/2021 Imaging   CT  CAP  IMPRESSION: 1. Large infiltrative cecal mass measuring approximately 7.1 cm, reflecting the known right-sided colonic neoplasm. 2. Similar extensive peritoneal/omental nodularity throughout the abdomen and pelvis with trace free fluid in the pelvis along the pericolic gutters, consistent with disease involvement. 3. Similar prominent and mildly enlarged retroperitoneal and mesenteric lymph nodes, most likely reflecting disease involvement. 4. Numerous scattered bilateral pulmonary nodules measuring up to 6 mm are similar to prior and nonspecific but suspicious for disease involvement. Attention on follow-up imaging suggested. 5. Similar mild right-sided hydronephrosis without hydroureter, with the distal ureter traversing peritoneal nodularity. Likely reflecting partial UPJ obstruction given lack of hydroureter. Attention on follow-up imaging suggested. Cholelithiasis without findings of acute cholecystitis. 6.  Aortic Atherosclerosis (ICD10-I70.0).   03/13/2021 Pathology Results   FINAL MICROSCOPIC DIAGNOSIS:   A. OMENTUM, NEEDLE CORE BIOPSY:  - Metastatic adenocarcinoma, consistent with clinical impression of a  colonic primary.  See comment    03/13/2021 Miscellaneous      03/16/2021 - 09/09/2021 Chemotherapy   Patient is on Treatment Plan : COLORECTAL FOLFOX + Bevacizumab q14d      06/05/2021 Imaging   CT CAP  IMPRESSION: Chest Impression:   1. Bilateral small pulmonary nodules appear unchanged. 2. No new nodularity. 3. No metastatic adenopathy.   Abdomen / Pelvis Impression:   1. Marked decrease in volume of circumferential mass surrounding the distal ileum leading up the terminal ileum. 2. Marked decrease in size of peritoneal implants along the ventral peritoneal surface of the lower abdomen. Persistent nodularity again much improved. 3. No evidence of liver metastasis.   09/15/2021 Imaging   CT CAP IMPRESSION: 1. Multiple scattered tiny bilateral pulmonary  nodules are stable  to minimally increased in size in the interval. There is a new 11 mm anterior left upper lobe pulmonary nodule on today's study. 2. Interval progression of soft tissue nodularity in the lower omentum with new disease now noted more cranially in the omentum. 3. Wall thickening in the terminal ileum appears qualitatively progressed in the interval with a string sign lumen on today's study. No features to suggest obstruction at this point. 4. Stable mild right hydronephrosis without associated hydroureter. 5. Colonic diverticulosis without diverticulitis. 6. Layering sludge or tiny stones in the gallbladder fundus. 7. Aortic Atherosclerosis (ICD10-I70.0).   09/21/2021 -  Chemotherapy   Patient is on Treatment Plan : COLORECTAL FOLFIRI / BEVACIZUMAB Q14D         INTERVAL HISTORY:  Kari Patel is here for a follow up of metastatic colon cancer. She was last seen by me on 12/11/21 while she was in the hospital. She presents to the clinic alone. She reports she is feeling somewhat better since discharge. She reports uncomfortable stomach growling since recent CT scan. She also reports constipation and has not had a bowel movement in 3 days.   All other systems were reviewed with the patient and are negative.  MEDICAL HISTORY:  Past Medical History:  Diagnosis Date   Anemia    Colon cancer ([place])    cecum   Heart murmur    Hypertension     SURGICAL HISTORY: Past Surgical History:  Procedure Laterality Date   ABDOMINAL HYSTERECTOMY     IR IMAGING GUIDED PORT INSERTION  03/13/2021    I have reviewed the social history and family history with the patient and they are unchanged from previous note.  ALLERGIES:  is allergic to demerol [meperidine hcl].  MEDICATIONS:  Current Outpatient Medications  Medication Sig Dispense Refill   lactulose (CHRONULAC) 10 GM/15ML solution Take 30 mLs (20 g total) by mouth every 4 (four) hours as needed for severe constipation. Reduce dose  or stop when you have diarrhea 236 mL 0   amLODipine (NORVASC) 10 MG tablet Take 1 tablet (10 mg total) by mouth daily. 20 tablet 0   DULoxetine (CYMBALTA) 20 MG capsule Take 1 capsule (20 mg total) by mouth daily. 30 capsule 3   KLOR-CON M20 20 MEQ tablet TAKE 1 TABLET BY MOUTH EVERY DAY (Patient taking differently: Take 20 mEq by mouth daily as needed (only after having blood work done).) 90 tablet 1   lidocaine-prilocaine (EMLA) cream Apply 1 application. topically as needed. (Patient taking differently: Apply 1 application. topically as needed (for port access).) 30 g 4   lisinopril (ZESTRIL) 5 MG tablet TAKE 1 TABLET (5 MG TOTAL) BY MOUTH DAILY. (Patient taking differently: Take 5 mg by mouth daily.) 90 tablet 0   loperamide (IMODIUM) 2 MG capsule Take 2 mg by mouth as needed for diarrhea or loose stools.     LORazepam (ATIVAN) 0.5 MG tablet Take 1 tablet (0.5 mg total) by mouth every 8 (eight) hours as needed (Anxiety, Cancer Chemotherapy-Induced Nausea and Vomiting). 45 tablet 0   ondansetron (ZOFRAN) 8 MG tablet Take 1 tablet (8 mg total) by mouth every 6 (six) hours as needed for nausea or vomiting. 20 tablet 1   pantoprazole (PROTONIX) 40 MG tablet Take 1 tablet (40 mg total) by mouth daily. 30 tablet 1   sucralfate (CARAFATE) 1 g tablet TAKE 1 TABLET BY MOUTH 4 TIMES DAILY - WITH MEALS AND AT BEDTIME. 60 tablet 0   trifluridine-tipiracil (LONSURF) 20-8.19 MG tablet Take  3 tablets (60 mg of trifluridine total) by mouth 2 (two) times daily after a meal. 1 hr after AM & PM meals on days 1-5, 8-12. Repeat every 28day 60 tablet 1   No current facility-administered medications for this visit.   Facility-Administered Medications Ordered in Other Visits  Medication Dose Route Frequency Provider Last Rate Last Admin   sodium chloride flush (NS) 0.9 % injection 10 mL  10 mL Intracatheter PRN [name], MD   10 mL at 12/29/21 1645    PHYSICAL EXAMINATION: ECOG PERFORMANCE STATUS: 2 -  Symptomatic, <50% confined to bed  Vitals:   12/29/21 1430 12/29/21 1432  BP: (!) 167/98 (!) 155/98  Pulse: (!) 114   Temp: 98 F (36.7 C)   SpO2: 100%    Wt Readings from Last 3 Encounters:  12/29/21 110 lb 14.4 oz (50.3 kg)  12/11/21 120 lb 5.9 oz (54.6 kg)  11/24/21 123 lb 3.2 oz (55.9 kg)     GENERAL:alert, no distress and comfortable SKIN: skin color normal, no rashes or significant lesions EYES: normal, Conjunctiva are pink and non-injected, sclera clear  NEURO: alert & oriented x 3 with fluent speech  LABORATORY DATA:  I have reviewed the data as listed    Latest Ref Rng & Units 12/29/2021    2:20 PM 12/11/2021    4:19 PM 12/10/2021    6:30 PM  CBC  WBC 4.0 - 10.5 K/uL 3.3   5.0   5.3    Hemoglobin 12.0 - 15.0 g/dL 12.8   10.4   13.7    Hematocrit 36.0 - 46.0 % 36.8   32.2   41.9    Platelets 150 - 400 K/uL 252   139   203          Latest Ref Rng & Units 12/29/2021    2:20 PM 12/12/2021    4:46 AM 12/11/2021    4:19 PM  CMP  Glucose 70 - 99 mg/dL 94   89   93    BUN 8 - 23 mg/dL $Remove'9   7   10    'JlfLbEl$ Creatinine 0.44 - 1.00 mg/dL 0.60   0.43   0.61    Sodium 135 - 145 mmol/L 136   137   136    Potassium 3.5 - 5.1 mmol/L 3.8   3.7   3.4    Chloride 98 - 111 mmol/L 96   106   106    CO2 22 - 32 mmol/L $RemoveB'27   24   22    'CtGwMRJE$ Calcium 8.9 - 10.3 mg/dL 10.0   8.8   8.2    Total Protein 6.5 - 8.1 g/dL 8.5      Total Bilirubin 0.3 - 1.2 mg/dL 0.9      Alkaline Phos 38 - 126 U/L 64      AST 15 - 41 U/L 12      ALT 0 - 44 U/L 8          RADIOGRAPHIC STUDIES: I have personally reviewed the radiological images as listed and agreed with the findings in the report. No results found.    No orders of the defined types were placed in this encounter.  All questions were answered. The patient knows to call the clinic with any problems, questions or concerns. No barriers to learning was detected. The total time spent in the appointment was 30 minutes.     [name], MD 12/29/2021    I, Wilburn Mylar, am  acting as scribe for Shaletta Hinostroza, MD.   I have reviewed the above documentation for accuracy and completeness, and I agree with the above.     

## 2021-12-30 ENCOUNTER — Other Ambulatory Visit: Payer: Self-pay | Admitting: Hematology

## 2021-12-30 DIAGNOSIS — K219 Gastro-esophageal reflux disease without esophagitis: Secondary | ICD-10-CM

## 2022-01-01 ENCOUNTER — Encounter (HOSPITAL_COMMUNITY): Payer: Self-pay

## 2022-01-01 ENCOUNTER — Observation Stay (HOSPITAL_COMMUNITY): Payer: Medicare HMO

## 2022-01-01 ENCOUNTER — Inpatient Hospital Stay (HOSPITAL_COMMUNITY)
Admission: EM | Admit: 2022-01-01 | Discharge: 2022-01-09 | DRG: 375 | Disposition: A | Discharge status: Hospice | Payer: Medicare HMO | Attending: Internal Medicine | Admitting: Internal Medicine

## 2022-01-01 ENCOUNTER — Encounter: Payer: Self-pay | Admitting: Hematology

## 2022-01-01 ENCOUNTER — Other Ambulatory Visit: Payer: Self-pay

## 2022-01-01 ENCOUNTER — Emergency Department (HOSPITAL_COMMUNITY): Payer: Medicare HMO

## 2022-01-01 DIAGNOSIS — E871 Hypo-osmolality and hyponatremia: Secondary | ICD-10-CM

## 2022-01-01 DIAGNOSIS — Z87891 Personal history of nicotine dependence: Secondary | ICD-10-CM

## 2022-01-01 DIAGNOSIS — I10 Essential (primary) hypertension: Secondary | ICD-10-CM | POA: Diagnosis not present

## 2022-01-01 DIAGNOSIS — Z79899 Other long term (current) drug therapy: Secondary | ICD-10-CM

## 2022-01-01 DIAGNOSIS — C182 Malignant neoplasm of ascending colon: Principal | ICD-10-CM | POA: Diagnosis present

## 2022-01-01 DIAGNOSIS — Z8249 Family history of ischemic heart disease and other diseases of the circulatory system: Secondary | ICD-10-CM

## 2022-01-01 DIAGNOSIS — Z888 Allergy status to other drugs, medicaments and biological substances status: Secondary | ICD-10-CM

## 2022-01-01 DIAGNOSIS — C786 Secondary malignant neoplasm of retroperitoneum and peritoneum: Secondary | ICD-10-CM

## 2022-01-01 DIAGNOSIS — E162 Hypoglycemia, unspecified: Secondary | ICD-10-CM | POA: Diagnosis not present

## 2022-01-01 DIAGNOSIS — E861 Hypovolemia: Secondary | ICD-10-CM | POA: Diagnosis present

## 2022-01-01 DIAGNOSIS — R739 Hyperglycemia, unspecified: Secondary | ICD-10-CM | POA: Diagnosis present

## 2022-01-01 DIAGNOSIS — R918 Other nonspecific abnormal finding of lung field: Secondary | ICD-10-CM | POA: Diagnosis present

## 2022-01-01 DIAGNOSIS — Z515 Encounter for palliative care: Secondary | ICD-10-CM

## 2022-01-01 DIAGNOSIS — D649 Anemia, unspecified: Secondary | ICD-10-CM | POA: Diagnosis present

## 2022-01-01 DIAGNOSIS — K56609 Unspecified intestinal obstruction, unspecified as to partial versus complete obstruction: Secondary | ICD-10-CM | POA: Diagnosis not present

## 2022-01-01 DIAGNOSIS — Z7189 Other specified counseling: Secondary | ICD-10-CM

## 2022-01-01 DIAGNOSIS — R531 Weakness: Secondary | ICD-10-CM

## 2022-01-01 LAB — URINALYSIS, ROUTINE W REFLEX MICROSCOPIC
Bilirubin Urine: NEGATIVE
Glucose, UA: NEGATIVE mg/dL
Hgb urine dipstick: NEGATIVE
Ketones, ur: NEGATIVE mg/dL
Leukocytes,Ua: NEGATIVE
Nitrite: NEGATIVE
Protein, ur: NEGATIVE mg/dL
Specific Gravity, Urine: 1.025 (ref 1.005–1.030)
pH: 7 (ref 5.0–8.0)

## 2022-01-01 LAB — CBC WITH DIFFERENTIAL/PLATELET
Abs Immature Granulocytes: 0.02 10*3/uL (ref 0.00–0.07)
Basophils Absolute: 0 10*3/uL (ref 0.0–0.1)
Basophils Relative: 1 %
Eosinophils Absolute: 0.1 10*3/uL (ref 0.0–0.5)
Eosinophils Relative: 2 %
HCT: 34.5 % — ABNORMAL LOW (ref 36.0–46.0)
Hemoglobin: 11.9 g/dL — ABNORMAL LOW (ref 12.0–15.0)
Immature Granulocytes: 0 %
Lymphocytes Relative: 11 %
Lymphs Abs: 0.5 10*3/uL — ABNORMAL LOW (ref 0.7–4.0)
MCH: 32.2 pg (ref 26.0–34.0)
MCHC: 34.5 g/dL (ref 30.0–36.0)
MCV: 93.5 fL (ref 80.0–100.0)
Monocytes Absolute: 0.5 10*3/uL (ref 0.1–1.0)
Monocytes Relative: 11 %
Neutro Abs: 3.7 10*3/uL (ref 1.7–7.7)
Neutrophils Relative %: 75 %
Platelets: 234 10*3/uL (ref 150–400)
RBC: 3.69 MIL/uL — ABNORMAL LOW (ref 3.87–5.11)
RDW: 13.4 % (ref 11.5–15.5)
WBC: 4.9 10*3/uL (ref 4.0–10.5)
nRBC: 0 % (ref 0.0–0.2)

## 2022-01-01 LAB — COMPREHENSIVE METABOLIC PANEL
ALT: 10 U/L (ref 0–44)
AST: 14 U/L — ABNORMAL LOW (ref 15–41)
Albumin: 3.9 g/dL (ref 3.5–5.0)
Alkaline Phosphatase: 62 U/L (ref 38–126)
Anion gap: 9 (ref 5–15)
BUN: 10 mg/dL (ref 8–23)
CO2: 27 mmol/L (ref 22–32)
Calcium: 9.4 mg/dL (ref 8.9–10.3)
Chloride: 93 mmol/L — ABNORMAL LOW (ref 98–111)
Creatinine, Ser: 0.61 mg/dL (ref 0.44–1.00)
GFR, Estimated: >60 mL/min (ref >60)
Glucose, Bld: 120 mg/dL — ABNORMAL HIGH (ref 70–99)
Potassium: 4.1 mmol/L (ref 3.5–5.1)
Sodium: 129 mmol/L — ABNORMAL LOW (ref 135–145)
Total Bilirubin: 0.5 mg/dL (ref 0.3–1.2)
Total Protein: 7.9 g/dL (ref 6.5–8.1)

## 2022-01-01 LAB — LACTIC ACID, PLASMA: Lactic Acid, Venous: 0.8 mmol/L (ref 0.5–1.9)

## 2022-01-01 LAB — LIPASE, BLOOD: Lipase: 32 U/L (ref 11–51)

## 2022-01-01 MED ORDER — SODIUM CHLORIDE 0.9 % IV SOLN: INTRAVENOUS | Status: DC

## 2022-01-01 MED ORDER — IOHEXOL 300 MG/ML  SOLN
100.0000 mL | Freq: Once | INTRAMUSCULAR | Status: AC | PRN
  Administered 2022-01-01: 75 mL via INTRAVENOUS

## 2022-01-01 MED ORDER — HYDROMORPHONE HCL 1 MG/ML IJ SOLN
0.5000 mg | Freq: Once | INTRAMUSCULAR | Status: AC
  Administered 2022-01-01: 0.5 mg via INTRAVENOUS
  Filled 2022-01-01: qty 1

## 2022-01-01 MED ORDER — SODIUM CHLORIDE (PF) 0.9 % IJ SOLN
INTRAMUSCULAR | Status: AC
  Filled 2022-01-01: qty 50

## 2022-01-01 MED ORDER — LACTATED RINGERS IV BOLUS
1000.0000 mL | Freq: Once | INTRAVENOUS | Status: AC
  Administered 2022-01-01: 1000 mL via INTRAVENOUS

## 2022-01-01 MED ORDER — ONDANSETRON HCL 4 MG/2ML IJ SOLN
4.0000 mg | Freq: Once | INTRAMUSCULAR | Status: AC
  Administered 2022-01-01: 4 mg via INTRAVENOUS
  Filled 2022-01-01: qty 2

## 2022-01-01 MED ORDER — LORAZEPAM 2 MG/ML IJ SOLN
0.5000 mg | Freq: Once | INTRAMUSCULAR | Status: AC
  Administered 2022-01-02: 0.5 mg via INTRAVENOUS
  Filled 2022-01-01: qty 1

## 2022-01-01 NOTE — Assessment & Plan Note (Signed)
Follows with [name] oncology -on immunotherapy and will start [place] on 5/29 due to progression of malignancy.

## 2022-01-01 NOTE — ED Provider Notes (Signed)
[facility] DEPT Provider Note   CSN: [id number] Arrival date & time: 01/01/22  1512     History  Chief Complaint  Patient presents with   Constipation    Kari Patel is a 71 y.o. female.   Constipation Associated symptoms: abdominal pain and nausea   Associated symptoms: no vomiting    71 year old female with medical history significant for stage IV colon cancer who presents to the emergency department with concern for constipation and generalized abdominal pain.  The patient states that she has not had a bowel movement since last Wednesday when she had a small bowel movement.  She does state that she is passing gas.  She has been prescribed lactulose and has been taking the medication without significant results.  She is currently on chemotherapy for her colon cancer was last seen in clinic on 12/29/2021.  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  This hospitalization occurred from 12/10/21 - 12/12/2021. She endorse generalized abdominal discomfort and nausea.  She denies any vomiting.  She denies any blood in her stools. No fevers.  Home Medications Prior to Admission medications   Medication Sig Start Date End Date Taking? Authorizing Provider  amLODipine (NORVASC) 10 MG tablet Take 1 tablet (10 mg total) by mouth daily. 11/24/21  Yes [name], MD  DULoxetine (CYMBALTA) 20 MG capsule Take 1 capsule (20 mg total) by mouth daily. 12/13/21  Yes Memon, Jolaine Artist, MD  KLOR-CON M20 20 MEQ tablet TAKE 1 TABLET BY MOUTH EVERY DAY Patient taking differently: Take 20 mEq by mouth daily as needed (only after having blood work done). 11/13/21  Yes [name], MD  lactulose (CHRONULAC) 10 GM/15ML solution Take 30 mLs (20 g total) by mouth every 4 (four) hours as needed for severe constipation. Reduce dose or stop when you have  diarrhea 12/29/21  Yes [name], MD  lidocaine-prilocaine (EMLA) cream Apply 1 application. topically as needed. Patient taking differently: Apply 1 application. topically as needed (for port access). 10/18/21  Yes [name], MD  lisinopril (ZESTRIL) 5 MG tablet TAKE 1 TABLET (5 MG TOTAL) BY MOUTH DAILY. Patient taking differently: Take 5 mg by mouth daily. 09/04/21  Yes [name], MD  LORazepam (ATIVAN) 0.5 MG tablet Take 1 tablet (0.5 mg total) by mouth every 8 (eight) hours as needed (Anxiety, Cancer Chemotherapy-Induced Nausea and Vomiting). 12/14/21  Yes [name], MD  pantoprazole (PROTONIX) 40 MG tablet Take 1 tablet (40 mg total) by mouth daily. 12/12/21 12/12/22 Yes Memon, Jolaine Artist, MD  sucralfate (CARAFATE) 1 g tablet TAKE 1 TABLET BY MOUTH 4 TIMES DAILY - WITH MEALS AND AT BEDTIME. Patient taking differently: Take 1 g by mouth 4 (four) times daily. 12/20/21  Yes [name], MD  ondansetron (ZOFRAN) 8 MG tablet Take 1 tablet (8 mg total) by mouth every 6 (six) hours as needed for nausea or vomiting. Patient not taking: Reported on 01/01/2022 12/12/21   Kathie Dike, MD  trifluridine-tipiracil (LONSURF) 20-8.19 MG tablet Take 3 tablets (60 mg of trifluridine total) by mouth 2 (two) times daily after a meal. 1 hr after AM & PM meals on days 1-5, 8-12. Repeat every 28day 12/27/21   [name], MD      Allergies    Demerol [meperidine hcl]    Review of Systems   Review of Systems  Gastrointestinal:  Positive for abdominal pain,  constipation and nausea. Negative for blood in stool and vomiting.   Physical Exam Updated Vital Signs BP 129/70   Pulse 76   Temp 98.3 F (36.8 C) (Oral)   Resp 16   SpO2 100%  Physical Exam Vitals and nursing note reviewed.  Constitutional:      General: She is not in acute distress.    Appearance: She is well-developed.  HENT:     Head: Normocephalic and atraumatic.  Eyes:     Conjunctiva/sclera: Conjunctivae normal.  Cardiovascular:     Rate and Rhythm:  Normal rate and regular rhythm.  Pulmonary:     Effort: Pulmonary effort is normal. No respiratory distress.     Breath sounds: Normal breath sounds.  Abdominal:     Palpations: Abdomen is soft.     Tenderness: There is abdominal tenderness.     Comments: Generalized abdominal tenderness without rebound or guarding  Musculoskeletal:        General: No swelling.     Cervical back: Neck supple.  Skin:    General: Skin is warm and dry.     Capillary Refill: Capillary refill takes less than 2 seconds.  Neurological:     Mental Status: She is alert.  Psychiatric:        Mood and Affect: Mood normal.    ED Results / Procedures / Treatments   Labs (all labs ordered are listed, but only abnormal results are displayed) Labs Reviewed  COMPREHENSIVE METABOLIC PANEL - Abnormal; Notable for the following components:      Result Value   Sodium 129 (*)    Chloride 93 (*)    Glucose, Bld 120 (*)    AST 14 (*)    All other components within normal limits  CBC WITH DIFFERENTIAL/PLATELET - Abnormal; Notable for the following components:   RBC 3.69 (*)    Hemoglobin 11.9 (*)    HCT 34.5 (*)    Lymphs Abs 0.5 (*)    All other components within normal limits  LIPASE, BLOOD  LACTIC ACID, PLASMA  URINALYSIS, ROUTINE W REFLEX MICROSCOPIC  LACTIC ACID, PLASMA    EKG None  Radiology CT ABDOMEN PELVIS W CONTRAST  Result Date: 01/01/2022 CLINICAL DATA:  Abdominal pain, nausea, history of colon cancer EXAM: CT ABDOMEN AND PELVIS WITH CONTRAST TECHNIQUE: Multidetector CT imaging of the abdomen and pelvis was performed using the standard protocol following bolus administration of intravenous contrast. RADIATION DOSE REDUCTION: This exam was performed according to the departmental dose-optimization program which includes automated exposure control, adjustment of the mA and/or kV according to patient size and/or use of iterative reconstruction technique. CONTRAST:  69m OMNIPAQUE IOHEXOL 300 MG/ML   SOLN COMPARISON:  12/08/2021 FINDINGS: Lower chest: Bilateral lower lobe pulmonary nodules are again identified. 6 mm right lower lobe pulmonary nodule image 2/6, and 10 mm left lower lobe pulmonary nodule image 17/6, not appreciably changed since prior study. Findings are consistent with pulmonary metastases. No acute airspace disease, effusion, or pneumothorax. Hepatobiliary: Stable 10 mm hypodense nodule superior aspect right lobe liver image 10/2. Metastatic disease is not excluded. The remainder of the liver is grossly unremarkable. Gallbladder is moderately distended, with minimal sludge or small noncalcified stones layering dependently within the gallbladder lumen. No gallbladder wall thickening or pericholecystic fluid. Pancreas: Unremarkable. No pancreatic ductal dilatation or surrounding inflammatory changes. Spleen: Normal in size without focal abnormality. Adrenals/Urinary Tract: Adrenal glands are unremarkable. Kidneys are normal, without renal calculi, focal lesion, or hydronephrosis. Bladder is unremarkable. Stomach/Bowel: There  is an enlarging ill-defined soft tissue mass within the right lower quadrant at the level of the ileocecal valve, measuring up to 4.7 x 3.6 cm. This results in high-grade small bowel obstruction, with a proximal small bowel measuring up to 3.9 cm in diameter, with multiple gas fluid levels. Mild wall thickening of the distal ileum persists. Mild gas and stool throughout the colon to the level of the rectum. Diffuse distal colonic diverticulosis without evidence of diverticulitis. Vascular/Lymphatic: Aortic atherosclerosis. No discrete pathologic adenopathy within the abdomen or pelvis. Reproductive: Status post hysterectomy. No adnexal masses. Other: Aggressive nodularity within the ventral omentum in the lower abdomen and pelvis again noted, consistent with progressive omental metastatic disease. No free fluid or free intraperitoneal gas. There is a small fat containing  umbilical hernia, with omental nodularity contain within the umbilical hernia site. Musculoskeletal: No acute or destructive bony lesions. Reconstructed images demonstrate no additional findings. IMPRESSION: 1. Enlarging soft tissue mass within the right lower quadrant, centered at the level of the ileocecal valve, resulting in high-grade small bowel obstruction. Findings are consistent with progressive malignancy. 2. Progressive omental nodularity within the lower abdomen and pelvis consistent with worsening metastatic disease. 3. Stable bilateral pulmonary nodules and right lobe hepatic hypodensity, consistent with metastatic disease. 4. Distal colonic diverticulosis without diverticulitis. 5.  Aortic Atherosclerosis (ICD10-I70.0). Electronically Signed   By: Randa Ngo M.D.   On: 01/01/2022 18:10    Procedures Procedures    Medications Ordered in ED Medications  lactated ringers bolus 1,000 mL (1,000 mLs Intravenous New Bag/Given 01/01/22 1639)  ondansetron (ZOFRAN) injection 4 mg (4 mg Intravenous Given 01/01/22 1639)  HYDROmorphone (DILAUDID) injection 0.5 mg (0.5 mg Intravenous Given 01/01/22 1639)  sodium chloride (PF) 0.9 % injection (  Given by Other 01/01/22 1745)  iohexol (OMNIPAQUE) 300 MG/ML solution 100 mL (75 mLs Intravenous Contrast Given 01/01/22 1739)    ED Course/ Medical Decision Making/ A&P                           Medical Decision Making Amount and/or Complexity of Data Reviewed Labs: ordered. Radiology: ordered.  Risk Prescription drug management. Decision regarding hospitalization.   71 year old female with medical history significant for stage IV colon cancer who presents to the emergency department with concern for constipation and generalized abdominal pain.  The patient states that she has not had a bowel movement since last Wednesday when she had a small bowel movement.  She does state that she is passing gas.  She has been prescribed lactulose and has been  taking the medication without significant results.  She is currently on chemotherapy for her colon cancer was last seen in clinic on 12/29/2021.  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  This hospitalization occurred from 12/10/21 - 12/12/2021. She endorse generalized abdominal discomfort and nausea.  She denies any vomiting.  She denies any blood in her stools. No fevers.  On arrival, the patient was afebrile, temperature 98.3, tachycardic P108, mildly hypertensive BP 141/90, saturating 100% on room air, sinus tachycardia noted on cardiac telemetry.  Physical exam significant for generalized abdominal tenderness to palpation without rebound or guarding.  Differential diagnosis includes constipation in the setting of the patient's colon cancer versus ileus versus recurrent small bowel obstruction.  Will obtain screening labs and CT abdomen pelvis to further assess.  The patient was administered an IV fluid bolus, IV Zofran and IV Dilaudid on arrival.   Laboratory work-up interpreted by myself and significant for hyponatremia 129, mild hyperglycemia 120, otherwise generally unremarkable, anemia is noted to 11.9, stable, no leukocytosis, lipase normal, lactic acid normal.  CT abdomen pelvis was performed and interpreted by radiology and concerning for high-grade small bowel obstruction likely due to the patient's burden of metastatic disease with an enlarging soft tissue mass within the right lower quadrant centered at the ileocecal valve. General surgery consult placed. IMPRESSION:  1. Enlarging soft tissue mass within the right lower quadrant,  centered at the level of the ileocecal valve, resulting in  high-grade small bowel obstruction. Findings are consistent with  progressive malignancy.  2. Progressive omental nodularity within the lower abdomen and   pelvis consistent with worsening metastatic disease.  3. Stable bilateral pulmonary nodules and right lobe hepatic  hypodensity, consistent with metastatic disease.  4. Distal colonic diverticulosis without diverticulitis.  5.  Aortic Atherosclerosis (ICD10-I70.0).   Dr. Flossie Buffy of hospitalist medicine accepted the patient in admission.  Final Clinical Impression(s) / ED Diagnoses Final diagnoses:  SBO (small bowel obstruction) ([place])    Rx / DC Orders ED Discharge Orders     None         Regan Lemming, MD 01/01/22 1912

## 2022-01-01 NOTE — Assessment & Plan Note (Signed)
Controlled. Hold amlodipine and Lisinopril while NPO.

## 2022-01-01 NOTE — Plan of Care (Signed)

## 2022-01-01 NOTE — ED Notes (Signed)
ED TO INPATIENT HANDOFF REPORT  ED Nurse Name and Phone #: [phone] Erick Colace, RN  S Name/Age/Gender Kari Patel 71 y.o. female Room/Bed: WA19/WA19  Code Status   Code Status: Prior  Home/SNF/Other Home Patient oriented to: self, place, time, and situation Is this baseline? Yes   Triage Complete: Triage complete  Chief Complaint SBO (small bowel obstruction) ([place]) [M38.466]  Triage Note Pt arrived via POV c/o constipation. States she saw PCP, gave her a rx with no relief. C/o abd pain and nausea as well.     Allergies Allergies  Allergen Reactions   Demerol [Meperidine Hcl] Nausea Only    Level of Care/Admitting Diagnosis ED Disposition     ED Disposition  Admit   Condition  --   Comment  Hospital Area: [facility] [[id number]]  Level of Care: Med-Surg [16]  May place patient in observation at [facility] or [facility] if equivalent level of care is available:: No  Covid Evaluation: Asymptomatic - no recent exposure (last 10 days) testing not required  Diagnosis: SBO (small bowel obstruction) [facility]) [[id number]]  Admitting Physician: Orene Desanctis [[id number]]  Attending Physician: Orene Desanctis [[id number]]          B Medical/Surgery History Past Medical History:  Diagnosis Date   Anemia    Colon cancer ([place])    cecum   Heart murmur    Hypertension    Past Surgical History:  Procedure Laterality Date   ABDOMINAL HYSTERECTOMY     IR IMAGING GUIDED PORT INSERTION  03/13/2021     A IV Location/Drains/Wounds Patient Lines/Drains/Airways Status     Active Line/Drains/Airways     Name Placement date Placement time Site Days   Implanted Port 03/13/21 Right Chest 03/13/21  1135  Chest  294   NG/OG Vented/Dual Lumen 16 Fr. Left nare Marking at nare/corner of mouth 01/01/22  1851  Left nare  less than 1            Intake/Output Last 24 hours No intake or output data in the 24 hours ending 01/01/22 2020  Labs/Imaging Results for orders  placed or performed during the hospital encounter of 01/01/22 (from the past 48 hour(s))  Lactic acid, plasma     Status: None   Collection Time: 01/01/22  4:22 PM  Result Value Ref Range   Lactic Acid, Venous 0.8 0.5 - 1.9 mmol/L    Comment: Performed at [facility], [place] [address], [place], [place] [address]  Comprehensive metabolic panel     Status: Abnormal   Collection Time: 01/01/22  4:37 PM  Result Value Ref Range   Sodium 129 (L) 135 - 145 mmol/L   Potassium 4.1 3.5 - 5.1 mmol/L   Chloride 93 (L) 98 - 111 mmol/L   CO2 27 22 - 32 mmol/L   Glucose, Bld 120 (H) 70 - 99 mg/dL    Comment: Glucose reference range applies only to samples taken after fasting for at least 8 hours.   BUN 10 8 - 23 mg/dL   Creatinine, Ser 0.61 0.44 - 1.00 mg/dL   Calcium 9.4 8.9 - 10.3 mg/dL   Total Protein 7.9 6.5 - 8.1 g/dL   Albumin 3.9 3.5 - 5.0 g/dL   AST 14 (L) 15 - 41 U/L   ALT 10 0 - 44 U/L   Alkaline Phosphatase 62 38 - 126 U/L   Total Bilirubin 0.5 0.3 - 1.2 mg/dL   GFR, Estimated >60 >60 mL/min  Comment: (NOTE) Calculated using the CKD-EPI Creatinine Equation (2021)    Anion gap 9 5 - 15    Comment: Performed at [facility], [place] [address], [place], [place] [address]  Lipase, blood     Status: None   Collection Time: 01/01/22  4:37 PM  Result Value Ref Range   Lipase 32 11 - 51 U/L    Comment: Performed at [facility], [place] [address] [place], [place] [address]  CBC with Diff     Status: Abnormal   Collection Time: 01/01/22  4:37 PM  Result Value Ref Range   WBC 4.9 4.0 - 10.5 K/uL   RBC 3.69 (L) 3.87 - 5.11 MIL/uL   Hemoglobin 11.9 (L) 12.0 - 15.0 g/dL   HCT 34.5 (L) 36.0 - 46.0 %   MCV 93.5 80.0 - 100.0 fL   MCH 32.2 26.0 - 34.0 pg   MCHC 34.5 30.0 - 36.0 g/dL   RDW 13.4 11.5 - 15.5 %   Platelets 234 150 - 400 K/uL   nRBC 0.0 0.0 - 0.2 %   Neutrophils Relative % 75 %   Neutro Abs 3.7 1.7 - 7.7 K/uL   Lymphocytes  Relative 11 %   Lymphs Abs 0.5 (L) 0.7 - 4.0 K/uL   Monocytes Relative 11 %   Monocytes Absolute 0.5 0.1 - 1.0 K/uL   Eosinophils Relative 2 %   Eosinophils Absolute 0.1 0.0 - 0.5 K/uL   Basophils Relative 1 %   Basophils Absolute 0.0 0.0 - 0.1 K/uL   Immature Granulocytes 0 %   Abs Immature Granulocytes 0.02 0.00 - 0.07 K/uL    Comment: Performed at [facility], [place] [address] [place], [place] [address]   DG Abdomen 1 View  Result Date: 01/01/2022 CLINICAL DATA:  NG tube placement. EXAM: ABDOMEN - 1 VIEW COMPARISON:  CT examination dated Jan 01, 2022 FINDINGS: NG tube with distal tip projecting over the gastric body. Multiple dilated small bowel loops suggesting ileus or obstruction. No radio-opaque calculi or other significant radiographic abnormality are seen. IMPRESSION: 1. NG tube with distal tip projecting over the body of the stomach. 2. Multiple dilated small bowel loops suggesting ileus or obstruction. Electronically Signed   By: Keane Police D.O.   On: 01/01/2022 19:25   CT ABDOMEN PELVIS W CONTRAST  Result Date: 01/01/2022 CLINICAL DATA:  Abdominal pain, nausea, history of colon cancer EXAM: CT ABDOMEN AND PELVIS WITH CONTRAST TECHNIQUE: Multidetector CT imaging of the abdomen and pelvis was performed using the standard protocol following bolus administration of intravenous contrast. RADIATION DOSE REDUCTION: This exam was performed according to the departmental dose-optimization program which includes automated exposure control, adjustment of the mA and/or kV according to patient size and/or use of iterative reconstruction technique. CONTRAST:  47m OMNIPAQUE IOHEXOL 300 MG/ML  SOLN COMPARISON:  12/08/2021 FINDINGS: Lower chest: Bilateral lower lobe pulmonary nodules are again identified. 6 mm right lower lobe pulmonary nodule image 2/6, and 10 mm left lower lobe pulmonary nodule image 17/6, not appreciably changed since prior study. Findings are consistent with  pulmonary metastases. No acute airspace disease, effusion, or pneumothorax. Hepatobiliary: Stable 10 mm hypodense nodule superior aspect right lobe liver image 10/2. Metastatic disease is not excluded. The remainder of the liver is grossly unremarkable. Gallbladder is moderately distended, with minimal sludge or small noncalcified stones layering dependently within the gallbladder lumen. No gallbladder wall thickening or pericholecystic fluid. Pancreas: Unremarkable. No pancreatic ductal dilatation or surrounding inflammatory  changes. Spleen: Normal in size without focal abnormality. Adrenals/Urinary Tract: Adrenal glands are unremarkable. Kidneys are normal, without renal calculi, focal lesion, or hydronephrosis. Bladder is unremarkable. Stomach/Bowel: There is an enlarging ill-defined soft tissue mass within the right lower quadrant at the level of the ileocecal valve, measuring up to 4.7 x 3.6 cm. This results in high-grade small bowel obstruction, with a proximal small bowel measuring up to 3.9 cm in diameter, with multiple gas fluid levels. Mild wall thickening of the distal ileum persists. Mild gas and stool throughout the colon to the level of the rectum. Diffuse distal colonic diverticulosis without evidence of diverticulitis. Vascular/Lymphatic: Aortic atherosclerosis. No discrete pathologic adenopathy within the abdomen or pelvis. Reproductive: Status post hysterectomy. No adnexal masses. Other: Aggressive nodularity within the ventral omentum in the lower abdomen and pelvis again noted, consistent with progressive omental metastatic disease. No free fluid or free intraperitoneal gas. There is a small fat containing umbilical hernia, with omental nodularity contain within the umbilical hernia site. Musculoskeletal: No acute or destructive bony lesions. Reconstructed images demonstrate no additional findings. IMPRESSION: 1. Enlarging soft tissue mass within the right lower quadrant, centered at the level of  the ileocecal valve, resulting in high-grade small bowel obstruction. Findings are consistent with progressive malignancy. 2. Progressive omental nodularity within the lower abdomen and pelvis consistent with worsening metastatic disease. 3. Stable bilateral pulmonary nodules and right lobe hepatic hypodensity, consistent with metastatic disease. 4. Distal colonic diverticulosis without diverticulitis. 5.  Aortic Atherosclerosis (ICD10-I70.0). Electronically Signed   By: Randa Ngo M.D.   On: 01/01/2022 18:10    Pending Labs Unresulted Labs (From admission, onward)     Start     Ordered   01/01/22 1622  Urinalysis, Routine w reflex microscopic  Once,   URGENT        01/01/22 1621   01/01/22 1622  Lactic acid, plasma  Now then every 2 hours,   R (with STAT occurrences)      01/01/22 1621            Vitals/Pain Today's Vitals   01/01/22 1800 01/01/22 1830 01/01/22 1845 01/01/22 1930  BP: 117/66 129/70  134/83  Pulse: 69 71 76 79  Resp: '18  16 17  '$ Temp:      TempSrc:      SpO2: 99% 99% 100% 99%  PainSc:        Isolation Precautions No active isolations  Medications Medications  lactated ringers bolus 1,000 mL (1,000 mLs Intravenous New Bag/Given 01/01/22 1639)  ondansetron (ZOFRAN) injection 4 mg (4 mg Intravenous Given 01/01/22 1639)  HYDROmorphone (DILAUDID) injection 0.5 mg (0.5 mg Intravenous Given 01/01/22 1639)  sodium chloride (PF) 0.9 % injection (  Given by Other 01/01/22 1745)  iohexol (OMNIPAQUE) 300 MG/ML solution 100 mL (75 mLs Intravenous Contrast Given 01/01/22 1739)    Mobility walks Low fall risk   Focused Assessments    R Recommendations: See Admitting Provider Note  Report given to:   Additional Notes:

## 2022-01-01 NOTE — H&P (Signed)
History and Physical    Patient: Kari Patel ZOX:[id number] DOB: 1951-01-20 DOA: 01/01/2022 DOS: the patient was seen and examined on 01/02/2022 PCP: Pcp, No  Patient coming from: Home  Chief Complaint:  Chief Complaint  Patient presents with   Constipation   HPI: Kari Patel is a 71 y.o. 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   Has not had bowel movement for 4 days and knew "something didn't feel right." No abdominal pain, nausea or vomiting.  She was last admitted 4/30-5/2 for partial SBO from worsening obstruction from her progressing mass.  She was followed by general surgery and oncology.  She was treated with conservative management, bowel rest, IV fluids and antiemetics with resolution of her obstruction. She has since follow-up with oncology and plans to start new treatment on 5/29.  In the ED, she was afebrile mildly tachycardic and normotensive on room air.  No leukocytosis, hemoglobin of 11.9.  Hyponatremia with sodium 129, BG of 120.  LFTs were within normal limits.  UA was negative.  CT of the abdomen pelvis shows high-grade small bowel obstruction from enlarging soft tissue mass at the level of the ileocecal valve.  Aggressive omental nodularity within the lower abdomen and pelvis consistent with worsening metastatic disease.  Stable bilateral pulmonary nodules and right lobe hepatic hypodensity consistent with metastatic disease.   Review of Systems: As mentioned in the history of present illness. All other systems reviewed and are negative. Past Medical History:  Diagnosis Date   Anemia    Colon cancer ([place])    cecum   Heart murmur    Hypertension    Past Surgical History:  Procedure Laterality Date   ABDOMINAL HYSTERECTOMY     IR IMAGING GUIDED PORT INSERTION  03/13/2021   Social History:  reports that she quit smoking about 28 years ago.  Her smoking use included cigarettes. She has a 2.50 pack-year smoking history. She has never used smokeless tobacco. She reports that she does not currently use alcohol. She reports that she does not use drugs.  Allergies  Allergen Reactions   Demerol [Meperidine Hcl] Nausea Only    Family History  Problem Relation Age of Onset   Stroke Mother    Hypertension Mother    Stroke Father    Hypertension Father     Prior to Admission medications   Medication Sig Start Date End Date Taking? Authorizing Provider  amLODipine (NORVASC) 10 MG tablet Take 1 tablet (10 mg total) by mouth daily. 11/24/21  Yes [name], MD  DULoxetine (CYMBALTA) 20 MG capsule Take 1 capsule (20 mg total) by mouth daily. 12/13/21  Yes Memon, Jolaine Artist, MD  KLOR-CON M20 20 MEQ tablet TAKE 1 TABLET BY MOUTH EVERY DAY Patient taking differently: Take 20 mEq by mouth daily as needed (only after having blood work done). 11/13/21  Yes [name], MD  lactulose (CHRONULAC) 10 GM/15ML solution Take 30 mLs (20 g total) by mouth every 4 (four) hours as needed for severe constipation. Reduce dose or stop when you have diarrhea 12/29/21  Yes [name], MD  lidocaine-prilocaine (EMLA) cream Apply 1 application. topically as needed. Patient taking differently: Apply 1 application. topically as needed (for port access). 10/18/21  Yes [name], MD  lisinopril (ZESTRIL) 5 MG tablet TAKE 1 TABLET (5 MG TOTAL) BY MOUTH DAILY. Patient taking differently: Take 5 mg by mouth daily. 09/04/21  Yes [name], MD  LORazepam (ATIVAN) 0.5 MG tablet Take 1 tablet (0.5 mg total) by mouth every 8 (eight) hours as needed (Anxiety, Cancer Chemotherapy-Induced Nausea and Vomiting). 12/14/21  Yes [name], MD  sucralfate (CARAFATE) 1 g tablet TAKE 1 TABLET BY MOUTH 4 TIMES DAILY - WITH MEALS AND AT BEDTIME. Patient taking differently: Take 1 g by mouth 4 (four) times daily. 12/20/21  Yes [name], MD  ondansetron (ZOFRAN) 8 MG tablet Take 1 tablet (8 mg total) by  mouth every 6 (six) hours as needed for nausea or vomiting. Patient not taking: Reported on 01/01/2022 12/12/21   Kathie Dike, MD  pantoprazole (PROTONIX) 40 MG tablet TAKE 1 TABLET BY MOUTH EVERY DAY 01/01/22   [name], MD  trifluridine-tipiracil (LONSURF) 20-8.19 MG tablet Take 3 tablets (60 mg of trifluridine total) by mouth 2 (two) times daily after a meal. 1 hr after AM & PM meals on days 1-5, 8-12. Repeat every 28day 12/27/21   [name], MD    Physical Exam: Vitals:   01/01/22 2000 01/01/22 2030 01/01/22 2101 01/02/22 0049  BP: 127/78 121/90 131/71 131/72  Pulse: 79 82 71 79  Resp: '16 17 16 18  '$ Temp:  98.2 F (36.8 C) (!) 97.5 F (36.4 C) 97.8 F (36.6 C)  TempSrc:  Oral Oral Oral  SpO2: 100% 99% 97% 98%  Weight:   54.6 kg   Height:   '5\' 4"'$  (1.626 m)    Constitutional: NAD, calm, comfortable, elderly female laying upright in bed Eyes: lids and conjunctivae normal ENMT: Mucous membranes are moist.  Neck: normal, supple, Respiratory: clear to auscultation bilaterally, no wheezing, no crackles. Normal respiratory effort. No accessory muscle use.  Cardiovascular: Regular rate and rhythm, no murmurs / rubs / gallops. No extremity edema.  Abdomen: soft, non-distended, no tenderness, no rebound tenderness, guarding or rigidity.  Minimal bowel sounds throughout  musculoskeletal: no clubbing / cyanosis. No joint deformity upper and lower extremities.  Skin: no rashes, lesions, ulcers.  Neurologic: CN 2-12 grossly intact.  Psychiatric: Normal judgment and insight. Alert and oriented x 3. Normal mood. Data Reviewed:  See HPI  Assessment and Plan: * SBO (small bowel obstruction) ([employer]) Recurrent due to progressing colonic mass -continue NG tube  -Need general surgery consult in the morning  Hyponatremia Na of 129. Start IV continuous fluid and monitor for repeat.   HTN (hypertension) Controlled. Hold amlodipine and Lisinopril while NPO.  Cancer of right colon [facility]) Follows  with [name] oncology -on immunotherapy and will start [facility] on 5/29 due to progression of malignancy.      Advance Care Planning:   Code Status: Full Code   Consults: needs general surgery consult   Family Communication: no family at bedside  Severity of Illness: The appropriate patient status for this patient is OBSERVATION. Observation status is judged to be reasonable and necessary in order to provide the required intensity of service to ensure the patient's safety. The patient's presenting symptoms, physical exam findings, and initial radiographic and laboratory data in the context of their medical condition is felt to place them at decreased risk for further clinical deterioration. Furthermore, it is anticipated that the patient will be medically stable for discharge from the hospital within 2 midnights of admission.   Author: Orene Desanctis, DO 01/02/2022 2:42 AM  For on call review [email or web address].

## 2022-01-01 NOTE — ED Triage Notes (Signed)
Pt arrived via POV c/o constipation. States she saw PCP, gave her a rx with no relief. C/o abd pain and nausea as well.

## 2022-01-01 NOTE — Assessment & Plan Note (Signed)
Na of 129. Start IV continuous fluid and monitor for repeat.

## 2022-01-01 NOTE — Progress Notes (Signed)
Patient arrived to floor via stretcher from the ED, A&Ox4, denies any pain, ng tube patent to left nare, connected to LIWS, purewick in place- 2nd suction set up to independent suction cannister, right chest port-a-cath accessed but dressing is completely dislodged- iv team consult placed for dressing change, rest of skin intact, admission questions completed, call bell and TV explained to patient, mouth swabs with small amount of water given to patient as she is currently NPO, vss, bed alarm set, will continue to monitor.

## 2022-01-01 NOTE — Assessment & Plan Note (Signed)
Recurrent due to progressing colonic mass -continue NG tube  -Need general surgery consult in the morning

## 2022-01-02 DIAGNOSIS — C786 Secondary malignant neoplasm of retroperitoneum and peritoneum: Secondary | ICD-10-CM

## 2022-01-02 DIAGNOSIS — Z8249 Family history of ischemic heart disease and other diseases of the circulatory system: Secondary | ICD-10-CM | POA: Diagnosis not present

## 2022-01-02 DIAGNOSIS — R739 Hyperglycemia, unspecified: Secondary | ICD-10-CM | POA: Diagnosis present

## 2022-01-02 DIAGNOSIS — Z515 Encounter for palliative care: Secondary | ICD-10-CM | POA: Diagnosis not present

## 2022-01-02 DIAGNOSIS — I10 Essential (primary) hypertension: Secondary | ICD-10-CM | POA: Diagnosis present

## 2022-01-02 DIAGNOSIS — Z79899 Other long term (current) drug therapy: Secondary | ICD-10-CM | POA: Diagnosis not present

## 2022-01-02 DIAGNOSIS — E871 Hypo-osmolality and hyponatremia: Secondary | ICD-10-CM | POA: Diagnosis present

## 2022-01-02 DIAGNOSIS — K56609 Unspecified intestinal obstruction, unspecified as to partial versus complete obstruction: Secondary | ICD-10-CM | POA: Diagnosis present

## 2022-01-02 DIAGNOSIS — Z888 Allergy status to other drugs, medicaments and biological substances status: Secondary | ICD-10-CM | POA: Diagnosis not present

## 2022-01-02 DIAGNOSIS — E861 Hypovolemia: Secondary | ICD-10-CM | POA: Diagnosis present

## 2022-01-02 DIAGNOSIS — R918 Other nonspecific abnormal finding of lung field: Secondary | ICD-10-CM | POA: Diagnosis present

## 2022-01-02 DIAGNOSIS — E162 Hypoglycemia, unspecified: Secondary | ICD-10-CM | POA: Diagnosis not present

## 2022-01-02 DIAGNOSIS — C182 Malignant neoplasm of ascending colon: Secondary | ICD-10-CM | POA: Diagnosis present

## 2022-01-02 DIAGNOSIS — R531 Weakness: Secondary | ICD-10-CM | POA: Diagnosis not present

## 2022-01-02 DIAGNOSIS — Z7189 Other specified counseling: Secondary | ICD-10-CM | POA: Diagnosis not present

## 2022-01-02 DIAGNOSIS — Z87891 Personal history of nicotine dependence: Secondary | ICD-10-CM | POA: Diagnosis not present

## 2022-01-02 DIAGNOSIS — D649 Anemia, unspecified: Secondary | ICD-10-CM | POA: Diagnosis present

## 2022-01-02 LAB — BASIC METABOLIC PANEL
Anion gap: 8 (ref 5–15)
BUN: 7 mg/dL — ABNORMAL LOW (ref 8–23)
CO2: 25 mmol/L (ref 22–32)
Calcium: 9 mg/dL (ref 8.9–10.3)
Chloride: 100 mmol/L (ref 98–111)
Creatinine, Ser: 0.45 mg/dL (ref 0.44–1.00)
GFR, Estimated: >60 mL/min (ref >60)
Glucose, Bld: 87 mg/dL (ref 70–99)
Potassium: 3.9 mmol/L (ref 3.5–5.1)
Sodium: 133 mmol/L — ABNORMAL LOW (ref 135–145)

## 2022-01-02 MED ORDER — PANTOPRAZOLE SODIUM 40 MG PO TBEC: 40.0000 mg | DELAYED_RELEASE_TABLET | Freq: Every day | ORAL | Status: DC

## 2022-01-02 MED ORDER — MELATONIN 3 MG PO TABS
3.0000 mg | ORAL_TABLET | Freq: Once | ORAL | Status: AC
Start: 2022-01-02 — End: 2022-01-02
  Administered 2022-01-02: 3 mg via ORAL
  Filled 2022-01-02: qty 1

## 2022-01-02 MED ORDER — ONDANSETRON HCL 4 MG/2ML IJ SOLN
4.0000 mg | Freq: Four times a day (QID) | INTRAMUSCULAR | Status: DC | PRN
  Administered 2022-01-02 – 2022-01-03 (×2): 4 mg via INTRAVENOUS
  Filled 2022-01-02 (×2): qty 2

## 2022-01-02 MED ORDER — CHLORHEXIDINE GLUCONATE CLOTH 2 % EX PADS
6.0000 | MEDICATED_PAD | Freq: Every day | CUTANEOUS | Status: DC
  Administered 2022-01-02 – 2022-01-09 (×8): 6 via TOPICAL

## 2022-01-02 MED ORDER — LORAZEPAM 0.5 MG PO TABS
0.5000 mg | ORAL_TABLET | Freq: Once | ORAL | Status: AC
  Administered 2022-01-02: 0.5 mg via ORAL
  Filled 2022-01-02: qty 1

## 2022-01-02 MED ORDER — SODIUM CHLORIDE 0.9 % IV SOLN: INTRAVENOUS | Status: DC

## 2022-01-02 MED ORDER — SUCRALFATE 1 GM/10ML PO SUSP
1.0000 g | Freq: Four times a day (QID) | ORAL | Status: DC
  Administered 2022-01-02 – 2022-01-03 (×7): 1 g
  Filled 2022-01-02 (×7): qty 10

## 2022-01-02 MED ORDER — PANTOPRAZOLE 2 MG/ML SUSPENSION
40.0000 mg | Freq: Every day | ORAL | Status: DC
  Administered 2022-01-02 – 2022-01-03 (×2): 40 mg
  Filled 2022-01-02 (×3): qty 20

## 2022-01-02 MED ORDER — AMLODIPINE BESYLATE 10 MG PO TABS
10.0000 mg | ORAL_TABLET | Freq: Every day | ORAL | Status: DC
  Administered 2022-01-02 – 2022-01-03 (×2): 10 mg
  Filled 2022-01-02 (×2): qty 1

## 2022-01-02 MED ORDER — DULOXETINE HCL 20 MG PO CPEP
20.0000 mg | ORAL_CAPSULE | Freq: Every day | ORAL | Status: DC
  Administered 2022-01-02: 20 mg via ORAL
  Filled 2022-01-02 (×3): qty 1

## 2022-01-02 MED ORDER — LISINOPRIL 5 MG PO TABS
5.0000 mg | ORAL_TABLET | Freq: Every day | ORAL | Status: DC
  Administered 2022-01-02 – 2022-01-03 (×2): 5 mg
  Filled 2022-01-02 (×2): qty 1

## 2022-01-02 NOTE — Progress Notes (Addendum)
PROGRESS NOTE    Kari Patel  RCV:[id number] DOB: 07/11/1951 DOA: 01/01/2022 PCP: Pcp, No   Brief Narrative: HPI per Dr. Tu  70 y.o. 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    Has not had bowel movement for 4 days and knew "something didn't feel right." No abdominal pain, nausea or vomiting.  She was last admitted 4/30-5/2 for partial SBO from worsening obstruction from her progressing mass.  She was followed by general surgery and oncology.  She was treated with conservative management, bowel rest, IV fluids and antiemetics with resolution of her obstruction. She has since follow-up with oncology and plans to start new treatment on 5/29.   In the ED, she was afebrile mildly tachycardic and normotensive on room air.  No leukocytosis, hemoglobin of 11.9.  Hyponatremia with sodium 129, BG of 120.  LFTs were within normal limits.  UA was negative.   CT of the abdomen pelvis shows high-grade small bowel obstruction from enlarging soft tissue mass at the level of the ileocecal valve.  Aggressive omental nodularity within the lower abdomen and pelvis consistent with worsening metastatic disease.  Stable bilateral pulmonary nodules and right lobe hepatic hypodensity consistent with metastatic disease.  Assessment & Plan:   Principal Problem:   SBO (small bowel obstruction) ([facility]) Active Problems:   Cancer of right colon ([facility])   HTN (hypertension)   Hyponatremia   Small bowel obstruction ([facility])   #1  Small bowel obstruction-CT scan shows high-grade small bowel obstruction from enlarging soft tissue mass at the level of ileocecal valve.  Aggressive omental nodularity within the lower abdomen and pelvis consistent with worsening metastatic disease.  Stable bilateral pulmonary nodules and right lobe hepatic hypodensity consistent metastatic disease.  Patient is admitted with  constipation. Continue NG tube suction.  If no improvement consider venting gastrostomy tube. Appreciate general surgery consult. Oncology [name] consulted. Consulted palliative care Patient was admitted to the hospital end of April beginning of May with a similar complaints and was treated conservatively and discharged home on 12/11/2021.  #2 history of essential hypertension stable.  On amlodipine ancestral.  #3 hyponatremia continue normal saline.  Estimated body mass index is 20.66 kg/m as calculated from the following:   Height as of this encounter: '5\' 4"'$  (1.626 m).   Weight as of this encounter: 54.6 kg.  DVT prophylaxis: SCD  code Status: Full code  family Communication: Son at bedside Disposition Plan:  Status is: Inpatient Remains inpatient appropriate because: High-grade small bowel obstruction   Consultants:  Oncology, general surgery, palliative care  Procedures: NG tube 01/01/2022 Antimicrobials: None  Subjective: She is resting in bed son by the bedside she feels like she is going to have a bowel movement Passing gas  Objective: Vitals:   01/01/22 2030 01/01/22 2101 01/02/22 0049 01/02/22 0544  BP: 121/90 131/71 131/72 130/73  Pulse: 82 71 79 81  Resp: '17 16 18 14  '$ Temp: 98.2 F (36.8 C) (!) 97.5 F (36.4 C) 97.8 F (36.6 C) 97.9 F (36.6 C)  TempSrc: Oral Oral Oral Oral  SpO2: 99% 97% 98% 97%  Weight:  54.6 kg    Height:  '5\' 4"'$  (1.626 m)      Intake/Output Summary (Last 24 hours) at 01/02/2022 1323 Last data filed at 01/02/2022 0600 Gross per 24 hour  Intake 1361 ml  Output 850 ml  Net 511 ml   [employer]  01/01/22 2101  Weight: 54.6 kg    Examination:  General exam: Appears calm and comfortable  Respiratory system: Clear to auscultation. Respiratory effort normal. Cardiovascular system: S1 & S2 heard, RRR. No JVD, murmurs, rubs, gallops or clicks. No pedal edema. Gastrointestinal system: Abdomen is nondistended, soft and nontender. No  organomegaly or masses felt. Normal bowel sounds heard. Central nervous system: Alert and oriented. No focal neurological deficits. Extremities: Symmetric 5 x 5 power. Skin: No rashes, lesions or ulcers Psychiatry: Judgement and insight appear normal. Mood & affect appropriate.     Data Reviewed: I have personally reviewed following labs and imaging studies  CBC: Recent Labs  Lab 12/29/21 1420 01/01/22 1637  WBC 3.3* 4.9  NEUTROABS 2.1 3.7  HGB 12.8 11.9*  HCT 36.8 34.5*  MCV 92.0 93.5  PLT 252 474   Basic Metabolic Panel: Recent Labs  Lab 12/29/21 1420 01/01/22 1637 01/02/22 0939  NA 136 129* 133*  K 3.8 4.1 3.9  CL 96* 93* 100  CO2 '27 27 25  '$ GLUCOSE 94 120* 87  BUN 9 10 7*  CREATININE 0.60 0.61 0.45  CALCIUM 10.0 9.4 9.0   GFR: Estimated Creatinine Clearance: 55.6 mL/min (by C-G formula based on SCr of 0.45 mg/dL). Liver Function Tests: Recent Labs  Lab 12/29/21 1420 01/01/22 1637  AST 12* 14*  ALT 8 10  ALKPHOS 64 62  BILITOT 0.9 0.5  PROT 8.5* 7.9  ALBUMIN 4.2 3.9   Recent Labs  Lab 01/01/22 1637  LIPASE 32   No results for input(s): AMMONIA in the last 168 hours. Coagulation Profile: No results for input(s): INR, PROTIME in the last 168 hours. Cardiac Enzymes: No results for input(s): CKTOTAL, CKMB, CKMBINDEX, TROPONINI in the last 168 hours. BNP (last 3 results) No results for input(s): PROBNP in the last 8760 hours. HbA1C: No results for input(s): HGBA1C in the last 72 hours. CBG: No results for input(s): GLUCAP in the last 168 hours. Lipid Profile: No results for input(s): CHOL, HDL, LDLCALC, TRIG, CHOLHDL, LDLDIRECT in the last 72 hours. Thyroid Function Tests: No results for input(s): TSH, T4TOTAL, FREET4, T3FREE, THYROIDAB in the last 72 hours. Anemia Panel: No results for input(s): VITAMINB12, FOLATE, FERRITIN, TIBC, IRON, RETICCTPCT in the last 72 hours. Sepsis Labs: Recent Labs  Lab 01/01/22 1622  LATICACIDVEN 0.8    No  results found for this or any previous visit (from the past 240 hour(s)).       Radiology Studies: DG Abdomen 1 View  Result Date: 01/01/2022 CLINICAL DATA:  NG tube placement. EXAM: ABDOMEN - 1 VIEW COMPARISON:  CT examination dated Jan 01, 2022 FINDINGS: NG tube with distal tip projecting over the gastric body. Multiple dilated small bowel loops suggesting ileus or obstruction. No radio-opaque calculi or other significant radiographic abnormality are seen. IMPRESSION: 1. NG tube with distal tip projecting over the body of the stomach. 2. Multiple dilated small bowel loops suggesting ileus or obstruction. Electronically Signed   By: Keane Police D.O.   On: 01/01/2022 19:25   CT ABDOMEN PELVIS W CONTRAST  Result Date: 01/01/2022 CLINICAL DATA:  Abdominal pain, nausea, history of colon cancer EXAM: CT ABDOMEN AND PELVIS WITH CONTRAST TECHNIQUE: Multidetector CT imaging of the abdomen and pelvis was performed using the standard protocol following bolus administration of intravenous contrast. RADIATION DOSE REDUCTION: This exam was performed according to the departmental dose-optimization program which includes automated exposure control, adjustment of the mA and/or kV according to patient size and/or use of iterative  reconstruction technique. CONTRAST:  64m OMNIPAQUE IOHEXOL 300 MG/ML  SOLN COMPARISON:  12/08/2021 FINDINGS: Lower chest: Bilateral lower lobe pulmonary nodules are again identified. 6 mm right lower lobe pulmonary nodule image 2/6, and 10 mm left lower lobe pulmonary nodule image 17/6, not appreciably changed since prior study. Findings are consistent with pulmonary metastases. No acute airspace disease, effusion, or pneumothorax. Hepatobiliary: Stable 10 mm hypodense nodule superior aspect right lobe liver image 10/2. Metastatic disease is not excluded. The remainder of the liver is grossly unremarkable. Gallbladder is moderately distended, with minimal sludge or small noncalcified stones  layering dependently within the gallbladder lumen. No gallbladder wall thickening or pericholecystic fluid. Pancreas: Unremarkable. No pancreatic ductal dilatation or surrounding inflammatory changes. Spleen: Normal in size without focal abnormality. Adrenals/Urinary Tract: Adrenal glands are unremarkable. Kidneys are normal, without renal calculi, focal lesion, or hydronephrosis. Bladder is unremarkable. Stomach/Bowel: There is an enlarging ill-defined soft tissue mass within the right lower quadrant at the level of the ileocecal valve, measuring up to 4.7 x 3.6 cm. This results in high-grade small bowel obstruction, with a proximal small bowel measuring up to 3.9 cm in diameter, with multiple gas fluid levels. Mild wall thickening of the distal ileum persists. Mild gas and stool throughout the colon to the level of the rectum. Diffuse distal colonic diverticulosis without evidence of diverticulitis. Vascular/Lymphatic: Aortic atherosclerosis. No discrete pathologic adenopathy within the abdomen or pelvis. Reproductive: Status post hysterectomy. No adnexal masses. Other: Aggressive nodularity within the ventral omentum in the lower abdomen and pelvis again noted, consistent with progressive omental metastatic disease. No free fluid or free intraperitoneal gas. There is a small fat containing umbilical hernia, with omental nodularity contain within the umbilical hernia site. Musculoskeletal: No acute or destructive bony lesions. Reconstructed images demonstrate no additional findings. IMPRESSION: 1. Enlarging soft tissue mass within the right lower quadrant, centered at the level of the ileocecal valve, resulting in high-grade small bowel obstruction. Findings are consistent with progressive malignancy. 2. Progressive omental nodularity within the lower abdomen and pelvis consistent with worsening metastatic disease. 3. Stable bilateral pulmonary nodules and right lobe hepatic hypodensity, consistent with metastatic  disease. 4. Distal colonic diverticulosis without diverticulitis. 5.  Aortic Atherosclerosis (ICD10-I70.0). Electronically Signed   By: MRanda NgoM.D.   On: 01/01/2022 18:10        Scheduled Meds:  amLODipine  10 mg Per Tube Daily   Chlorhexidine Gluconate Cloth  6 each Topical Daily   DULoxetine  20 mg Oral Daily   lisinopril  5 mg Per Tube Daily   pantoprazole sodium  40 mg Per Tube Daily   sucralfate  1 g Per Tube QID   Continuous Infusions:  sodium chloride 75 mL/hr at 01/02/22 1315     LOS: 0 days    Time spent: 35 minutes  EGeorgette Shell MD 01/02/2022, 1:23 PM

## 2022-01-02 NOTE — Progress Notes (Signed)
Subjective: Pt known to our service for recent admission on 12/11/21 for similar symptoms.  She has known metastatic R colon cancer, Stage IV, diagnosed in April 2022 who is followed by [name].  She is currently undergoing chemotherapy, irinotecan/beva, with her last dose this past Friday.  They have plans to add Lonsurf on top of the beva starting later this week apparently.  3 weeks ago, she had a SBO secondary to her malignancy and this seemed to clinically resolve and the patient was discharge.  She states today she returned to the hospital due to constipation for the last 4 days, but no real increase in pain, N/V.  She has a CT that reveals an enlarging soft tissue mass in the RLQ at ICV with a high-grade SBO with other findings of progressive metastatic disease including worsening carcinomatosis, pulmonary and hepatic nodules.  We have been asked to see for further recommendations.  ROS: See above, otherwise other systems negative  Objective: Vital signs in last 24 hours: Temp:  [97.5 F (36.4 C)-98.3 F (36.8 C)] 97.9 F (36.6 C) (05/23 0544) Pulse Rate:  [69-108] 81 (05/23 0544) Resp:  [10-18] 14 (05/23 0544) BP: (117-141)/(66-90) 130/73 (05/23 0544) SpO2:  [97 %-100 %] 97 % (05/23 0544) Weight:  [54.6 kg] 54.6 kg (05/22 2101) Last BM Date : 12/28/21  Intake/Output from previous day: 05/22 0701 - 05/23 0700 In: 1361 [I.V.:361; IV Piggyback:1000] Out: 850 [Urine:850] Intake/Output this shift: No intake/output data recorded.  PE: Gen: NAD HEENT: NGT in her nose with no current output Heart: regular Lungs: CTAB Abd: soft, minimally tender in RLQ, seems minimally distended.  Mass felt at her umbilicus as well as in her RLQ c/w tumor noted on CT scan.  Lab Results:  Recent Labs    01/01/22 1637  WBC 4.9  HGB 11.9*  HCT 34.5*  PLT 234   BMET Recent Labs    01/01/22 1637 01/02/22 0939  NA 129* 133*  K 4.1 3.9  CL 93* 100  CO2 27 25  GLUCOSE 120* 87  BUN  10 7*  CREATININE 0.61 0.45  CALCIUM 9.4 9.0   PT/INR No results for input(s): LABPROT, INR in the last 72 hours. CMP     Component Value Date/Time   NA 133 (L) 01/02/2022 0939   K 3.9 01/02/2022 0939   CL 100 01/02/2022 0939   CO2 25 01/02/2022 0939   GLUCOSE 87 01/02/2022 0939   BUN 7 (L) 01/02/2022 0939   CREATININE 0.45 01/02/2022 0939   CREATININE 0.60 12/29/2021 1420   CALCIUM 9.0 01/02/2022 0939   PROT 7.9 01/01/2022 1637   ALBUMIN 3.9 01/01/2022 1637   AST 14 (L) 01/01/2022 1637   AST 12 (L) 12/29/2021 1420   ALT 10 01/01/2022 1637   ALT 8 12/29/2021 1420   ALKPHOS 62 01/01/2022 1637   BILITOT 0.5 01/01/2022 1637   BILITOT 0.9 12/29/2021 1420   GFRNONAA >60 01/02/2022 0939   GFRNONAA >60 12/29/2021 1420   Lipase     Component Value Date/Time   LIPASE 32 01/01/2022 1637       Studies/Results: DG Abdomen 1 View  Result Date: 01/01/2022 CLINICAL DATA:  NG tube placement. EXAM: ABDOMEN - 1 VIEW COMPARISON:  CT examination dated Jan 01, 2022 FINDINGS: NG tube with distal tip projecting over the gastric body. Multiple dilated small bowel loops suggesting ileus or obstruction. No radio-opaque calculi or other significant radiographic abnormality are seen. IMPRESSION: 1. NG tube  with distal tip projecting over the body of the stomach. 2. Multiple dilated small bowel loops suggesting ileus or obstruction. Electronically Signed   By: Keane Police D.O.   On: 01/01/2022 19:25   CT ABDOMEN PELVIS W CONTRAST  Result Date: 01/01/2022 CLINICAL DATA:  Abdominal pain, nausea, history of colon cancer EXAM: CT ABDOMEN AND PELVIS WITH CONTRAST TECHNIQUE: Multidetector CT imaging of the abdomen and pelvis was performed using the standard protocol following bolus administration of intravenous contrast. RADIATION DOSE REDUCTION: This exam was performed according to the departmental dose-optimization program which includes automated exposure control, adjustment of the mA and/or kV  according to patient size and/or use of iterative reconstruction technique. CONTRAST:  46m OMNIPAQUE IOHEXOL 300 MG/ML  SOLN COMPARISON:  12/08/2021 FINDINGS: Lower chest: Bilateral lower lobe pulmonary nodules are again identified. 6 mm right lower lobe pulmonary nodule image 2/6, and 10 mm left lower lobe pulmonary nodule image 17/6, not appreciably changed since prior study. Findings are consistent with pulmonary metastases. No acute airspace disease, effusion, or pneumothorax. Hepatobiliary: Stable 10 mm hypodense nodule superior aspect right lobe liver image 10/2. Metastatic disease is not excluded. The remainder of the liver is grossly unremarkable. Gallbladder is moderately distended, with minimal sludge or small noncalcified stones layering dependently within the gallbladder lumen. No gallbladder wall thickening or pericholecystic fluid. Pancreas: Unremarkable. No pancreatic ductal dilatation or surrounding inflammatory changes. Spleen: Normal in size without focal abnormality. Adrenals/Urinary Tract: Adrenal glands are unremarkable. Kidneys are normal, without renal calculi, focal lesion, or hydronephrosis. Bladder is unremarkable. Stomach/Bowel: There is an enlarging ill-defined soft tissue mass within the right lower quadrant at the level of the ileocecal valve, measuring up to 4.7 x 3.6 cm. This results in high-grade small bowel obstruction, with a proximal small bowel measuring up to 3.9 cm in diameter, with multiple gas fluid levels. Mild wall thickening of the distal ileum persists. Mild gas and stool throughout the colon to the level of the rectum. Diffuse distal colonic diverticulosis without evidence of diverticulitis. Vascular/Lymphatic: Aortic atherosclerosis. No discrete pathologic adenopathy within the abdomen or pelvis. Reproductive: Status post hysterectomy. No adnexal masses. Other: Aggressive nodularity within the ventral omentum in the lower abdomen and pelvis again noted, consistent with  progressive omental metastatic disease. No free fluid or free intraperitoneal gas. There is a small fat containing umbilical hernia, with omental nodularity contain within the umbilical hernia site. Musculoskeletal: No acute or destructive bony lesions. Reconstructed images demonstrate no additional findings. IMPRESSION: 1. Enlarging soft tissue mass within the right lower quadrant, centered at the level of the ileocecal valve, resulting in high-grade small bowel obstruction. Findings are consistent with progressive malignancy. 2. Progressive omental nodularity within the lower abdomen and pelvis consistent with worsening metastatic disease. 3. Stable bilateral pulmonary nodules and right lobe hepatic hypodensity, consistent with metastatic disease. 4. Distal colonic diverticulosis without diverticulitis. 5.  Aortic Atherosclerosis (ICD10-I70.0). Electronically Signed   By: MRanda NgoM.D.   On: 01/01/2022 18:10    Anti-infectives: Anti-infectives (From admission, onward)    None        Assessment/Plan SBO in setting of widely metastatic R colon cancer Unfortunately the patient has a recurrence of her SBO within a short period of time, along with progression of her disease despite treatment.  There is nothing to offer this patient surgically that will help her at this point.  She could potentially clinically resolve, however if she did, I would suspect he would not last long before she has recurrent symptoms.  If the patient does not clinically open up, then she would likely need a palliative venting g-tube for comfort.  She would greatly benefit from a palliative care consult to help her and her family establish goals of care moving forward.  [name] has also been made aware of the patient's admission and is going to see the patient as well for any further oncologic recommendations.  We will follow with you for now.  FEN - NPO/NGT VTE - ok for chemical prophylaxis from our standpoint ID - none  currently needed  HTN  I reviewed hospitalist notes, last 24 h vitals and pain scores, last 48 h intake and output, last 24 h labs and trends, and last 24 h imaging results.   LOS: 0 days    Henreitta Cea , [facility] 01/02/2022, 11:27 AM Please see Amion for pager number during day hours 7:00am-4:30pm or 7:00am -11:30am on weekends

## 2022-01-02 NOTE — Progress Notes (Addendum)
HEMATOLOGY-ONCOLOGY PROGRESS NOTE  ASSESSMENT AND PLAN: 1.  Metastatic colon cancer 2.  Small bowel obstruction  -The patient has progressive metastatic colon cancer and plan was to switch therapy to [place] with bevacizumab.  Now found to have a recurrent small bowel obstruction. -The patient has been seen by general surgery and surgery not recommended.  If symptoms do not improve, could consider for palliative gastrostomy tube placement. -We will continue goals of care discussion with the patient in the setting of progressive malignancy.  Kari Bussing, NP   SUBJECTIVE: [name] is followed by our office for metastatic colon cancer.  Last seen on 5/19 and at that visit, recent CT scan results were reviewed.  She has evidence of disease progression and the recommendation was made to switch to [facility] and to continue bevacizumab.  Our pharmacist was working on obtaining this medication with plans for her to begin on 5/29.  Additionally, at that visit, her overall poor prognosis was discussed.  She presented to the emergency department with constipation.  CT abdomen/pelvis showed an enlarging soft tissue mass within the right lower quadrant centered at the level of the ileocecal valve resulting in a high-grade small bowel obstruction as well as findings consistent with progressive malignancy.  She also has progressive omental nodularity in the lower abdomen and pelvis consistent with worsening metastatic disease.  The patient has been evaluated by general surgery and no surgical intervention is recommended.  Currently being medically managed.  They also suggested possible venting gastrostomy tube.  This morning, the patient reports some mild abdominal discomfort.  She is not having any nausea or vomiting currently.  Last bowel movement was about 4 days ago.  She has no other complaints this morning.  Oncology History Overview Note  Cancer Staging Cancer of right colon [facility]) Staging form: Colon  and Rectum - Neuroendocine Tumors, AJCC 8th Edition - Clinical stage from 12/13/2020: Stage IV (cTX, cN1, cM1) - Signed by [name], MD on 03/01/2021    Cancer of right colon [facility])  12/07/2020 Procedure   Colonoscopy  Impression: - non-bleeding internal hemorrhoids - diverticulosis in entire examined colon - one 9 mm polyp in ascending colon removed with hot snare - one 13 mm polyp in transverse colon removed with hot fnare - likely malignant partially-obstructing tumor in cecum.   12/07/2020 Pathology Results   FINAL PATHOLOGIC DIAGNOSIS  SMALL BOWEL, BIOPSIES - duodenal mucosa within normal limits GASTRIC BODY AND ATRIUM, BIOPSY - gastric antral and body-type mucosa with moderate chronic Helicobacter pylori gastritis ASCENDING COLON, POLYPECTOMY - tubular adenoma with prominent eosinophilic infiltrate CECUM, "MASS," BIOPSIES - moderately differentiated adenocarcinoma TRANSVERSE COLON, POLYPECTOMY - high grade dysplasia involving tubulovillous adenoma - cauterized margins negative   12/13/2020 Cancer Staging   Staging form: Colon and Rectum - Neuroendocine Tumors, AJCC 8th Edition - Clinical stage from 12/13/2020: Stage IV (cTX, cN1, cM1) - Signed by [name], MD on 03/01/2021    12/22/2020 Imaging   CT CAP  IMPRESSION: Large 7.5 cm cecal mass with surrounding nodularity/lymphadenopathy 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 The largest measures 6 mm in the RUL. Findings are nonspecific but can be followed per Fleischner criteria Hiatal hernia   03/01/2021 Initial Diagnosis   Cancer of right colon ([place])    03/10/2021 Imaging   CT CAP  IMPRESSION:  1. Large infiltrative cecal mass  measuring approximately 7.1 cm, reflecting the known right-sided colonic neoplasm. 2. Similar extensive peritoneal/omental nodularity throughout the abdomen and pelvis with trace free fluid in the pelvis along the pericolic gutters, consistent with disease involvement. 3. Similar prominent and mildly enlarged retroperitoneal and mesenteric lymph nodes, most likely reflecting disease involvement. 4. Numerous scattered bilateral pulmonary nodules measuring up to 6 mm are similar to prior and nonspecific but suspicious for disease involvement. Attention on follow-up imaging suggested. 5. Similar mild right-sided hydronephrosis without hydroureter, with the distal ureter traversing peritoneal nodularity. Likely reflecting partial UPJ obstruction given lack of hydroureter. Attention on follow-up imaging suggested. Cholelithiasis without findings of acute cholecystitis. 6.  Aortic Atherosclerosis (ICD10-I70.0).   03/13/2021 Pathology Results   FINAL MICROSCOPIC DIAGNOSIS:   A. OMENTUM, NEEDLE CORE BIOPSY:  - Metastatic adenocarcinoma, consistent with clinical impression of a  colonic primary.  See comment    03/13/2021 Miscellaneous      03/16/2021 - 09/09/2021 Chemotherapy   Patient is on Treatment Plan : COLORECTAL FOLFOX + Bevacizumab q14d      06/05/2021 Imaging   CT CAP  IMPRESSION: Chest Impression:   1. Bilateral small pulmonary nodules appear unchanged. 2. No new nodularity. 3. No metastatic adenopathy.   Abdomen / Pelvis Impression:   1. Marked decrease in volume of circumferential mass surrounding the distal ileum leading up the terminal ileum. 2. Marked decrease in size of peritoneal implants along the ventral peritoneal surface of the lower abdomen. Persistent nodularity again much improved. 3. No evidence of liver metastasis.   09/15/2021 Imaging   CT CAP IMPRESSION: 1. Multiple scattered tiny bilateral pulmonary nodules are stable to minimally increased in size in the  interval. There is a new 11 mm anterior left upper lobe pulmonary nodule on today's study. 2. Interval progression of soft tissue nodularity in the lower omentum with new disease now noted more cranially in the omentum. 3. Wall thickening in the terminal ileum appears qualitatively progressed in the interval with a string sign lumen on today's study. No features to suggest obstruction at this point. 4. Stable mild right hydronephrosis without associated hydroureter. 5. Colonic diverticulosis without diverticulitis. 6. Layering sludge or tiny stones in the gallbladder fundus. 7. Aortic Atherosclerosis (ICD10-I70.0).   09/21/2021 -  Chemotherapy   Patient is on Treatment Plan : COLORECTAL FOLFIRI / BEVACIZUMAB Q14D         REVIEW OF SYSTEMS:   Review of Systems  Constitutional:  Negative for chills and fever.  Respiratory:  Negative for cough and shortness of breath.   Cardiovascular:  Negative for chest pain.  Gastrointestinal:  Positive for abdominal pain and constipation. Negative for nausea and vomiting.  Neurological: Negative.   Endo/Heme/Allergies: Negative.    I have reviewed the past medical history, past surgical history, social history and family history with the patient and they are unchanged from previous note.   PHYSICAL EXAMINATION: ECOG PERFORMANCE STATUS: 2 - Symptomatic, <50% confined to bed  Vitals:   01/02/22 0049 01/02/22 0544  BP: 131/72 130/73  Pulse: 79 81  Resp: 18 14  Temp: 97.8 F (36.6 C) 97.9 F (36.6 C)  SpO2: 98% 97%   Filed Weights   01/01/22 2101  Weight: 54.6 kg    Intake/Output from previous day: 05/22 0701 - 05/23 0700 In: 1361 [I.V.:361; IV Piggyback:1000] Out: 850 [Urine:850]  Physical Exam Vitals reviewed.  Constitutional:      General: She is not in acute distress. HENT:  Head: Normocephalic.     Nose:     Comments: NG tube in place without any output. Cardiovascular:     Rate and Rhythm: Normal rate and regular rhythm.   Pulmonary:     Effort: Pulmonary effort is normal. No respiratory distress.  Abdominal:     Palpations: Abdomen is soft.     Comments: Minimally tender in RLQ, minimally distended.  Mass felt at her umbilicus as well as in her RLQ.   Skin:    General: Skin is warm and dry.  Neurological:     Mental Status: She is alert and oriented to person, place, and time.    LABORATORY DATA:  I have reviewed the data as listed    Latest Ref Rng & Units 01/01/2022    4:37 PM 12/29/2021    2:20 PM 12/12/2021    4:46 AM  CMP  Glucose 70 - 99 mg/dL 120   94   89    BUN 8 - 23 mg/dL '10   9   7    '$ Creatinine 0.44 - 1.00 mg/dL 0.61   0.60   0.43    Sodium 135 - 145 mmol/L 129   136   137    Potassium 3.5 - 5.1 mmol/L 4.1   3.8   3.7    Chloride 98 - 111 mmol/L 93   96   106    CO2 22 - 32 mmol/L '27   27   24    '$ Calcium 8.9 - 10.3 mg/dL 9.4   10.0   8.8    Total Protein 6.5 - 8.1 g/dL 7.9   8.5     Total Bilirubin 0.3 - 1.2 mg/dL 0.5   0.9     Alkaline Phos 38 - 126 U/L 62   64     AST 15 - 41 U/L 14   12     ALT 0 - 44 U/L 10   8       Lab Results  Component Value Date   WBC 4.9 01/01/2022   HGB 11.9 (L) 01/01/2022   HCT 34.5 (L) 01/01/2022   MCV 93.5 01/01/2022   PLT 234 01/01/2022   NEUTROABS 3.7 01/01/2022    Lab Results  Component Value Date   CEA1 5.56 (H) 12/29/2021    DG Abdomen 1 View  Result Date: 01/01/2022 CLINICAL DATA:  NG tube placement. EXAM: ABDOMEN - 1 VIEW COMPARISON:  CT examination dated Jan 01, 2022 FINDINGS: NG tube with distal tip projecting over the gastric body. Multiple dilated small bowel loops suggesting ileus or obstruction. No radio-opaque calculi or other significant radiographic abnormality are seen. IMPRESSION: 1. NG tube with distal tip projecting over the body of the stomach. 2. Multiple dilated small bowel loops suggesting ileus or obstruction. Electronically Signed   By: Keane Police D.O.   On: 01/01/2022 19:25   CT ABDOMEN PELVIS W  CONTRAST  Result Date: 01/01/2022 CLINICAL DATA:  Abdominal pain, nausea, history of colon cancer EXAM: CT ABDOMEN AND PELVIS WITH CONTRAST TECHNIQUE: Multidetector CT imaging of the abdomen and pelvis was performed using the standard protocol following bolus administration of intravenous contrast. RADIATION DOSE REDUCTION: This exam was performed according to the departmental dose-optimization program which includes automated exposure control, adjustment of the mA and/or kV according to patient size and/or use of iterative reconstruction technique. CONTRAST:  67m OMNIPAQUE IOHEXOL 300 MG/ML  SOLN COMPARISON:  12/08/2021 FINDINGS: Lower chest: Bilateral lower lobe pulmonary nodules are again identified.  6 mm right lower lobe pulmonary nodule image 2/6, and 10 mm left lower lobe pulmonary nodule image 17/6, not appreciably changed since prior study. Findings are consistent with pulmonary metastases. No acute airspace disease, effusion, or pneumothorax. Hepatobiliary: Stable 10 mm hypodense nodule superior aspect right lobe liver image 10/2. Metastatic disease is not excluded. The remainder of the liver is grossly unremarkable. Gallbladder is moderately distended, with minimal sludge or small noncalcified stones layering dependently within the gallbladder lumen. No gallbladder wall thickening or pericholecystic fluid. Pancreas: Unremarkable. No pancreatic ductal dilatation or surrounding inflammatory changes. Spleen: Normal in size without focal abnormality. Adrenals/Urinary Tract: Adrenal glands are unremarkable. Kidneys are normal, without renal calculi, focal lesion, or hydronephrosis. Bladder is unremarkable. Stomach/Bowel: There is an enlarging ill-defined soft tissue mass within the right lower quadrant at the level of the ileocecal valve, measuring up to 4.7 x 3.6 cm. This results in high-grade small bowel obstruction, with a proximal small bowel measuring up to 3.9 cm in diameter, with multiple gas fluid  levels. Mild wall thickening of the distal ileum persists. Mild gas and stool throughout the colon to the level of the rectum. Diffuse distal colonic diverticulosis without evidence of diverticulitis. Vascular/Lymphatic: Aortic atherosclerosis. No discrete pathologic adenopathy within the abdomen or pelvis. Reproductive: Status post hysterectomy. No adnexal masses. Other: Aggressive nodularity within the ventral omentum in the lower abdomen and pelvis again noted, consistent with progressive omental metastatic disease. No free fluid or free intraperitoneal gas. There is a small fat containing umbilical hernia, with omental nodularity contain within the umbilical hernia site. Musculoskeletal: No acute or destructive bony lesions. Reconstructed images demonstrate no additional findings. IMPRESSION: 1. Enlarging soft tissue mass within the right lower quadrant, centered at the level of the ileocecal valve, resulting in high-grade small bowel obstruction. Findings are consistent with progressive malignancy. 2. Progressive omental nodularity within the lower abdomen and pelvis consistent with worsening metastatic disease. 3. Stable bilateral pulmonary nodules and right lobe hepatic hypodensity, consistent with metastatic disease. 4. Distal colonic diverticulosis without diverticulitis. 5.  Aortic Atherosclerosis (ICD10-I70.0). Electronically Signed   By: Randa Ngo M.D.   On: 01/01/2022 18:10   CT CHEST ABDOMEN PELVIS W CONTRAST  Result Date: 12/10/2021 CLINICAL DATA:  Colon cancer restaging. EXAM: CT CHEST, ABDOMEN, AND PELVIS WITH CONTRAST TECHNIQUE: Multidetector CT imaging of the chest, abdomen and pelvis was performed following the standard protocol during bolus administration of intravenous contrast. RADIATION DOSE REDUCTION: This exam was performed according to the departmental dose-optimization program which includes automated exposure control, adjustment of the mA and/or kV according to patient size  and/or use of iterative reconstruction technique. CONTRAST:  70m OMNIPAQUE IOHEXOL 300 MG/ML  SOLN COMPARISON:  CT 09/15/2021 FINDINGS: CT CHEST FINDINGS Cardiovascular: Atherosclerosis of the thoracic aorta. Normal heart size. Coronary artery calcifications. Right chest port in place. No pericardial effusion. Mediastinum/Nodes: No enlarged mediastinal, hilar, or axillary lymph nodes. No supraclavicular adenopathy. 9 mm left thyroid nodule. Not clinically significant; no follow-up imaging recommended (ref: J Am Coll Radiol. 2015 Feb;12(2): 143-50).Minimal wall thickening of the gastroesophageal junction. Lungs/Pleura: Multiple pulmonary nodules, dominant nodules are as follows. -6 mm right upper lobe, series 6, image 26, previously 6 mm -5 mm subpleural left upper lobe series 6, image 33, previously 5 mm -4 mm anterior right upper lobe series 6, image 48, unchanged in size but now cavitary -13 x 10 mm left upper lobe anteriorly series 6, image 62, previously 11 x 7 mm -6 mm right lower lobe series 6,  image 69, previously 6 mm There are multiple additional millimetric nodules ladder not significantly changed. No confluent consolidation. No pleural effusion. Trachea and central bronchi are patent. Musculoskeletal: No destructive lytic or blastic osseous lesions. Stable osseous structures. Vascular calcifications in the anterior chest wall. No chest wall soft tissue lesions. CT ABDOMEN PELVIS FINDINGS Hepatobiliary: There is a subtle 9 mm hypodensity in the right hepatic lobe, series 2, image 38. This was not seen on the prior exam layering sludge or stones in the gallbladder. No pericholecystic inflammation. No biliary dilatation. Pancreas: No ductal dilatation or inflammation. No evidence of pancreatic mass. Spleen: Normal in size without focal abnormality. Adrenals/Urinary Tract: No adrenal nodule. Slight decreased right hydronephrosis without hydroureter. There is homogeneous enhancement. Slight decreased right  renal excretion. Focal renal mass. Partially distended, unremarkable urinary bladder Stomach/Bowel: The stomach is unremarkable. Small bowel is prominent in caliber measuring up to 2.6 cm, enteric contrast is not reached the colon. There is a moderate length of distal ileum in the pelvis with demonstrates wall thickening and edema, series 2, image 97. the soft tissue nodule adjacent to the terminal ileum is difficult to accurately measure as the adjacent bowel is unopacified, however measures approximately 3.2 x 2 cm, unchanged, series 2, image 93. Thickening in the region of the terminal ileum persists. Moderate volume of colonic stool with diffuse colonic diverticulosis, prominent in the sigmoid. There is no evidence of diverticulitis. Vascular/Lymphatic: Aortic atherosclerosis. Patent portal, splenic, and mesenteric veins. Hepatic duodenal node measures 11 mm, series 2, image 56, previously 10 mm, but subjectively unchanged. There is a 6 mm central mesenteric node, series 2, image 78, not definitively seen on prior. Occasional additional prominent central mesenteric nodes. Reproductive: The uterus is surgically absent.  No adnexal mass. Other: Ill-defined soft tissue nodularity in the lower abdomen, right greater than left, appears progressed in the interim. Representative left anterior omental lesion measures 3.7 x 1.4 cm, series 2, image 91, grossly unchanged in size but increased in density. Abdominal anterior soft tissue nodule measures 8 mm, series 2, image 71, previously 6 mm. There is trace perihepatic ascites that is new. There is a small umbilical hernia containing fat and omental nodularity. Musculoskeletal: Stable punctate L1 bone island. No lytic or destructive osseous lesion. Degenerative subchondral cyst in the right acetabulum. No acute osseous findings. IMPRESSION: 1. Progressive soft tissue nodularity in the lower abdomen, right greater than left, consistent with progressive metastatic disease.  2. Ill-defined 9 mm hypodensity in the right hepatic lobe was not seen on the prior exam, suspicious for metastasis. 3. Multiple pulmonary nodules, with largest nodule slightly increasing in size from prior exam. The remainder of the nodules are stable. Some of the nodules are now cavitary indicating treatment response. 4. Wall thickening in the terminal ileum appears similar with stable adjacent soft tissue nodule. There is new wall thickening and perienteric edema in the distal ileum in the low pelvis that may represent enteritis. More proximal small bowel is prominent in caliber, and contrast does not reach the colon. Partial obstruction is considered. 5. Trace perihepatic ascites, new. 6. Slight decreased right hydronephrosis without hydroureter. 7. Colonic diverticulosis without acute inflammation. 8. Layering gallstones or sludge without inflammation. Aortic Atherosclerosis (ICD10-I70.0). Electronically Signed   By: Keith Rake M.D.   On: 12/10/2021 20:42   DG CHEST PORT 1 VIEW  Result Date: 12/11/2021 CLINICAL DATA:  Fever.  Metastatic colon cancer. EXAM: PORTABLE CHEST 1 VIEW COMPARISON:  CT chest 12/08/2021 FINDINGS: Right chest wall porta  catheter tip overlies the superior aspect of the superior vena cava. Cardiac silhouette and mediastinal contours are within normal limits with mild calcification within aortic arch. A skin fold overlies the superior and lateral aspects of the left lung, with vascular marking seen peripheral to this no definite pneumothorax seen. The lungs are clear. Note is made of multiple bilateral pulmonary nodule seen on recent CT that are not as well visualized on radiographs, including right lung apex 6 mm anteromedial left upper lung 13 mm pulmonary nodules. No pleural effusion or pneumothorax. No acute skeletal abnormality. IMPRESSION:: IMPRESSION: 1. No acute lung process. 2. Multiple bilateral pulmonary nodules measuring up to 13 mm on the left and 6 mm on the right are  better seen on prior CT. Electronically Signed   By: Yvonne Kendall M.D.   On: 12/11/2021 10:27   DG Abd Portable 1V  Result Date: 12/11/2021 CLINICAL DATA:  Small-bowel obstruction.  History of colon cancer. EXAM: PORTABLE ABDOMEN - 1 VIEW COMPARISON:  Abdominopelvic CT 12/08/2021 FINDINGS: The bowel gas pattern is nonobstructive. There is no supine evidence of free intraperitoneal air or bowel wall thickening. Residual contrast is noted throughout colonic diverticula. There is new patchy airspace disease at the right lung base with a possible associated small right pleural effusion. Mild convex right lumbar scoliosis. Telemetry leads overlie the abdomen. IMPRESSION: Nonobstructive bowel gas pattern. New opacity overlying the right lung base suspicious for basilar infiltrate or atelectasis and a possible small right pleural effusion. Electronically Signed   By: Richardean Sale M.D.   On: 12/11/2021 08:00     Future Appointments  Date Time Provider [place]  02/05/2022  7:45 AM [facility] None  02/05/2022  8:20 AM [name], MD [facility] None  02/05/2022  9:15 AM [facility] INFUSION [facility] None      LOS: 0 days   Addendum  I have seen the patient, examined her. I agree with the assessment and and plan and have edited the notes.   Iram was admitted for recurrent small bowel obstruction, secondary to her cecum tumor.  She also has peritoneal metastasis, which may partially contribute to her bowel obstruction.  I reviewed her CT scan from yesterday, which showed cancer progression.  She has been evaluated by general surgery Dr. Johney Maine, who feels she is a poor candidate for surgery due to her peritoneal metastasis.  I certainly do not think she is a candidate for right hemicolectomy, but will discuss with Dr. Johney Maine to see if ileostomy is an option to resolve her small bowel obstruction issue.  Unfortunately this is a very difficult situation, she is unlikely a candidate  for more chemotherapy.  I will recommend palliative care and hospice.  I discussed with patient and her family (mainly her son, her husband has dementia). Terianna has hard time to understand why her condition has gotten worse so quickly.  She was not ready to discuss about hospice.  She has been coming to my clinic by herself every time, I previously called her son to update him, but he was not aware all the details about her metastatic cancer.  He was quite in shock also.  I will contact palliative care for consult, to discuss goals of care with patient and her son further. I will f/u on Thursday. I spent a total of 50 mins in her care today, including her care coordination, and additional discussion with her son out of her room for counseling.   [name]  01/02/2022

## 2022-01-02 NOTE — Progress Notes (Signed)
  Transition of Care [facility]) Screening Note   Patient Details  Name: Kari Patel Date of Birth: 10/18/1950   Transition of Care [facility]) CM/SW Contact:    Seaver Machia, Marjie Skiff, RN Phone Number: 01/02/2022, 12:47 PM    Transition of Care Department [facility]) has reviewed patient and no TOC needs have been identified at this time. We will continue to monitor patient advancement through interdisciplinary progression rounds. If new patient transition needs arise, please place a TOC consult.

## 2022-01-03 DIAGNOSIS — C182 Malignant neoplasm of ascending colon: Secondary | ICD-10-CM | POA: Diagnosis not present

## 2022-01-03 DIAGNOSIS — Z515 Encounter for palliative care: Secondary | ICD-10-CM | POA: Diagnosis not present

## 2022-01-03 DIAGNOSIS — E871 Hypo-osmolality and hyponatremia: Secondary | ICD-10-CM | POA: Diagnosis not present

## 2022-01-03 DIAGNOSIS — K56609 Unspecified intestinal obstruction, unspecified as to partial versus complete obstruction: Secondary | ICD-10-CM | POA: Diagnosis not present

## 2022-01-03 DIAGNOSIS — Z7189 Other specified counseling: Secondary | ICD-10-CM | POA: Diagnosis not present

## 2022-01-03 MED ORDER — MELATONIN 3 MG PO TABS
3.0000 mg | ORAL_TABLET | Freq: Once | ORAL | Status: AC
  Administered 2022-01-03: 3 mg
  Filled 2022-01-03: qty 1

## 2022-01-03 MED ORDER — ACETAMINOPHEN 325 MG PO TABS
650.0000 mg | ORAL_TABLET | Freq: Four times a day (QID) | ORAL | Status: DC | PRN
Start: 2022-01-03 — End: 2022-01-09
  Administered 2022-01-03 – 2022-01-07 (×6): 650 mg via ORAL
  Filled 2022-01-03 (×8): qty 2

## 2022-01-03 MED ORDER — LORAZEPAM 0.5 MG PO TABS
0.5000 mg | ORAL_TABLET | Freq: Once | ORAL | Status: AC
  Administered 2022-01-03: 0.5 mg
  Filled 2022-01-03: qty 1

## 2022-01-03 MED ORDER — PHENOL 1.4 % MT LIQD
1.0000 | OROMUCOSAL | Status: DC | PRN
  Administered 2022-01-03: 1 via OROMUCOSAL
  Filled 2022-01-03: qty 177

## 2022-01-03 MED ORDER — CEFAZOLIN SODIUM-DEXTROSE 2-4 GM/100ML-% IV SOLN: 2.0000 g | INTRAVENOUS | Status: DC

## 2022-01-03 NOTE — Progress Notes (Signed)
Referring Physician(s): Gross,S  Supervising Physician: Michaelle Birks  Patient Status:  [facility] - In-pt  Chief Complaint:  Colon cancer, small bowel obstruction, constipation  Subjective: Patient known to IR service from Port-A-Cath placement and right lower quadrant omental mass biopsy on 03/13/2021.  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  There is also progressive omental nodularity within the lower abdomen and pelvis consistent with worsening disease as well as stable pulmonary nodules and right hepatic lobe hypodensity consistent with mets.  Patient's last BM was last Friday.  She has NG tube in place. Currently afebrile, last WBC 2 days ago normal, hemoglobin 11.9, platelets normal, creatinine normal PT/INR pending.  Patient not surgical candidate secondary to worsening carcinomatosis and request now received for palliative/venting gastrostomy.  She denies recent fever, headache, chest pain, worsening dyspnea, cough, back pain, bleeding.  She has had recent nausea and vomiting with some abdominal discomfort as well as constipation.   Past Medical History:  Diagnosis Date   Anemia    Colon cancer ([place])    cecum   Heart murmur    Hypertension    Past Surgical History:  Procedure Laterality Date   ABDOMINAL HYSTERECTOMY     IR IMAGING GUIDED PORT INSERTION  03/13/2021      Allergies: Demerol [meperidine hcl]  Medications: Prior to Admission medications   Medication Sig Start Date End Date Taking? Authorizing Provider  amLODipine (NORVASC) 10 MG tablet Take 1 tablet (10 mg total) by mouth daily. 11/24/21  Yes [name], MD  DULoxetine (CYMBALTA) 20 MG capsule Take 1 capsule (20 mg total) by mouth daily. 12/13/21  Yes Memon, Jolaine Artist, MD   KLOR-CON M20 20 MEQ tablet TAKE 1 TABLET BY MOUTH EVERY DAY Patient taking differently: Take 20 mEq by mouth daily as needed (only after having blood work done). 11/13/21  Yes [name], MD  lactulose (CHRONULAC) 10 GM/15ML solution Take 30 mLs (20 g total) by mouth every 4 (four) hours as needed for severe constipation. Reduce dose or stop when you have diarrhea 12/29/21  Yes [name], MD  lidocaine-prilocaine (EMLA) cream Apply 1 application. topically as needed. Patient taking differently: Apply 1 application. topically as needed (for port access). 10/18/21  Yes [name], MD  lisinopril (ZESTRIL) 5 MG tablet TAKE 1 TABLET (5 MG TOTAL) BY MOUTH DAILY. Patient taking differently: Take 5 mg by mouth daily. 09/04/21  Yes [name], MD  LORazepam (ATIVAN) 0.5 MG tablet Take 1 tablet (0.5 mg total) by mouth every 8 (eight) hours as needed (Anxiety, Cancer Chemotherapy-Induced Nausea and Vomiting). 12/14/21  Yes [name], MD  sucralfate (CARAFATE) 1 g tablet TAKE 1 TABLET BY MOUTH 4 TIMES DAILY - WITH MEALS AND AT BEDTIME. Patient taking differently: Take 1 g by mouth 4 (four) times daily. 12/20/21  Yes [name], MD  ondansetron (ZOFRAN) 8 MG tablet Take 1 tablet (8 mg total) by mouth every 6 (six) hours as needed for nausea or vomiting. Patient not taking: Reported on 01/01/2022 12/12/21   Kathie Dike, MD  pantoprazole (PROTONIX) 40 MG tablet TAKE 1 TABLET BY MOUTH EVERY DAY 01/01/22   [name], MD  trifluridine-tipiracil (LONSURF) 20-8.19 MG tablet Take 3 tablets (60 mg of trifluridine total) by mouth 2 (two) times daily  after a meal. 1 hr after AM & PM meals on days 1-5, 8-12. Repeat every 28day 12/27/21   [name], MD     Vital Signs: BP 134/71 (BP Location: Left Arm)   Pulse 84   Temp 98.4 F (36.9 C) (Oral)   Resp 17   Ht '5\' 4"'$  (1.626 m)   Wt 120 lb 5.9 oz (54.6 kg)   SpO2 99%   BMI 20.66 kg/m   Physical Exam awake, alert.  NG-tube in place.  Family in room.  Chest clear to auscultation  bilaterally.  Heart with regular rate and rhythm.  Abdomen soft, mildly tender right lower quadrant ,mild distention, few BS.  No lower extremity edema  Imaging: DG Abdomen 1 View  Result Date: 01/01/2022 CLINICAL DATA:  NG tube placement. EXAM: ABDOMEN - 1 VIEW COMPARISON:  CT examination dated Jan 01, 2022 FINDINGS: NG tube with distal tip projecting over the gastric body. Multiple dilated small bowel loops suggesting ileus or obstruction. No radio-opaque calculi or other significant radiographic abnormality are seen. IMPRESSION: 1. NG tube with distal tip projecting over the body of the stomach. 2. Multiple dilated small bowel loops suggesting ileus or obstruction. Electronically Signed   By: Keane Police D.O.   On: 01/01/2022 19:25   CT ABDOMEN PELVIS W CONTRAST  Result Date: 01/01/2022 CLINICAL DATA:  Abdominal pain, nausea, history of colon cancer EXAM: CT ABDOMEN AND PELVIS WITH CONTRAST TECHNIQUE: Multidetector CT imaging of the abdomen and pelvis was performed using the standard protocol following bolus administration of intravenous contrast. RADIATION DOSE REDUCTION: This exam was performed according to the departmental dose-optimization program which includes automated exposure control, adjustment of the mA and/or kV according to patient size and/or use of iterative reconstruction technique. CONTRAST:  69m OMNIPAQUE IOHEXOL 300 MG/ML  SOLN COMPARISON:  12/08/2021 FINDINGS: Lower chest: Bilateral lower lobe pulmonary nodules are again identified. 6 mm right lower lobe pulmonary nodule image 2/6, and 10 mm left lower lobe pulmonary nodule image 17/6, not appreciably changed since prior study. Findings are consistent with pulmonary metastases. No acute airspace disease, effusion, or pneumothorax. Hepatobiliary: Stable 10 mm hypodense nodule superior aspect right lobe liver image 10/2. Metastatic disease is not excluded. The remainder of the liver is grossly unremarkable. Gallbladder is moderately  distended, with minimal sludge or small noncalcified stones layering dependently within the gallbladder lumen. No gallbladder wall thickening or pericholecystic fluid. Pancreas: Unremarkable. No pancreatic ductal dilatation or surrounding inflammatory changes. Spleen: Normal in size without focal abnormality. Adrenals/Urinary Tract: Adrenal glands are unremarkable. Kidneys are normal, without renal calculi, focal lesion, or hydronephrosis. Bladder is unremarkable. Stomach/Bowel: There is an enlarging ill-defined soft tissue mass within the right lower quadrant at the level of the ileocecal valve, measuring up to 4.7 x 3.6 cm. This results in high-grade small bowel obstruction, with a proximal small bowel measuring up to 3.9 cm in diameter, with multiple gas fluid levels. Mild wall thickening of the distal ileum persists. Mild gas and stool throughout the colon to the level of the rectum. Diffuse distal colonic diverticulosis without evidence of diverticulitis. Vascular/Lymphatic: Aortic atherosclerosis. No discrete pathologic adenopathy within the abdomen or pelvis. Reproductive: Status post hysterectomy. No adnexal masses. Other: Aggressive nodularity within the ventral omentum in the lower abdomen and pelvis again noted, consistent with progressive omental metastatic disease. No free fluid or free intraperitoneal gas. There is a small fat containing umbilical hernia, with omental nodularity contain within the umbilical hernia site. Musculoskeletal: No acute or destructive bony  lesions. Reconstructed images demonstrate no additional findings. IMPRESSION: 1. Enlarging soft tissue mass within the right lower quadrant, centered at the level of the ileocecal valve, resulting in high-grade small bowel obstruction. Findings are consistent with progressive malignancy. 2. Progressive omental nodularity within the lower abdomen and pelvis consistent with worsening metastatic disease. 3. Stable bilateral pulmonary nodules  and right lobe hepatic hypodensity, consistent with metastatic disease. 4. Distal colonic diverticulosis without diverticulitis. 5.  Aortic Atherosclerosis (ICD10-I70.0). Electronically Signed   By: Randa Ngo M.D.   On: 01/01/2022 18:10    Labs:  CBC: Recent Labs    12/10/21 1830 12/11/21 1619 12/29/21 1420 01/01/22 1637  WBC 5.3 5.0 3.3* 4.9  HGB 13.7 10.4* 12.8 11.9*  HCT 41.9 32.2* 36.8 34.5*  PLT 203 139* 252 234    COAGS: Recent Labs    03/13/21 0952  INR 1.2    BMP: Recent Labs    12/12/21 0446 12/29/21 1420 01/01/22 1637 01/02/22 0939  NA 137 136 129* 133*  K 3.7 3.8 4.1 3.9  CL 106 96* 93* 100  CO2 '24 27 27 25  '$ GLUCOSE 89 94 120* 87  BUN 7* 9 10 7*  CALCIUM 8.8* 10.0 9.4 9.0  CREATININE 0.43* 0.60 0.61 0.45  GFRNONAA >60 >60 >60 >60    LIVER FUNCTION TESTS: Recent Labs    11/24/21 1034 12/10/21 1830 12/29/21 1420 01/01/22 1637  BILITOT 0.4 0.9 0.9 0.5  AST 12* 17 12* 14*  ALT '6 11 8 10  '$ ALKPHOS 68 71 64 62  PROT 7.2 8.5* 8.5* 7.9  ALBUMIN 3.9 4.5 4.2 3.9    Assessment and Plan: Patient known to IR service from Port-A-Cath placement and right lower quadrant omental mass biopsy on 03/13/2021.  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  There is also progressive omental nodularity within the lower abdomen and pelvis consistent with worsening disease as well as stable pulmonary nodules and right hepatic lobe hypodensity consistent with mets.  Patient's last BM was last Friday.  She has NG tube in place. Currently afebrile, last WBC 2 days ago normal, hemoglobin 11.9, platelets normal, creatinine normal PT/INR pending.  Patient not surgical candidate secondary to worsening carcinomatosis and request now  received for palliative/venting gastrostomy.Imaging studies were reviewed by Dr. Pascal Lux.Risks and benefits image guided gastrostomy tube placement was discussed with the patient /family including, but not limited to the need for a barium enema during the procedure, bleeding, infection, peritonitis and/or damage to adjacent structures.  All of the patient's questions were answered, patient is agreeable to proceed.  Consent signed and in chart. Procedure tent planned for 5/25 or 5/26    Electronically Signed: D. Rowe Robert, PA-C 01/03/2022, 1:27 PM   I spent a total of 25 minutes at the the patient's bedside AND on the patient's hospital floor or unit, greater than 50% of which was counseling/coordinating care for percutaneous venting gastrostomy tube    Patient ID: Kari Patel, female   DOB: [birth year], 71 y.o.   MRN: [id number]

## 2022-01-03 NOTE — Consult Note (Signed)
Consultation Note Date: 01/03/2022   Patient Name: Kari Patel  DOB: [birth year]  MRN: [id number]  Age / Sex: 71 y.o., female  PCP: Pcp, No Referring Physician: Bonnielee Haff, MD  Reason for Consultation: Establishing goals of care  HPI/Patient Profile: 71 y.o. female   admitted on 01/01/2022    Clinical Assessment and Goals of Care: 71 year old lady who sees [name] with medical oncology for metastatic right colon cancer stage IV diagnosed in April 2022.  Patient underwent chemotherapy.  Patient presented 3 weeks ago with small bowel obstruction secondary to malignancy which appeared to clinically resolved and patient was discharged.  Patient admitted this time to hospital medicine service because of constipation and CT scan showing enlarging soft tissue mass in right lower quadrant at ileocecal valve with high-grade small bowel obstruction along with progressive metastatic disease including worsening carcinomatosis and she also has evidence of pulmonary and hepatic nodules.  General surgery colleagues are also following alongside medical oncology and patient remains admitted to hospital medicine service.  Palliative medicine team consulted for complex decision making in the face of serious irreversible illness and for additional support as well as for facilitating ongoing goals of care discussions and for support regarding decision making. Patient is awake alert resting in bed.  She has NG tube.  Her youngest son is present at the bedside.  Introduced myself and palliative care as follows: Palliative medicine is specialized medical care for people living with serious illness. It focuses on providing relief from the symptoms and stress of a serious illness. The goal is to improve quality of life for both the patient and the family. Goals of care: Broad aims of medical therapy in relation to the patient's values and  preferences. Our aim is to provide medical care aimed at enabling patients to achieve the goals that matter most to them, given the circumstances of their particular medical situation and their constraints.    NEXT OF KIN  Husband 3 sons.   Discussion/SUMMARY OF RECOMMENDATIONS    Full Code/Full scope care for now.  CODE STATUS and broad goals of care discussions initiated at the time of this initial encounter.  We spent some time reviewing the patient's CT scan both from 12-08-2021 as well as 01-01-2022 compared and contrasted to the best of my ability.  Discussed about prognostic implications of progressive cancer and worsening carcinomatosis in detail.  Briefly introduced concept of hospice/palliative services.  Briefly introduced concept of aggressive symptom management and focusing on comfort measures/quality of life.  Patient and family state that they are still very much in shock about patient's recent imaging findings and about the extensive nature of her disease and about her ongoing progressive illness.  Patient and family are willing to consider venting PEG, IR consulted.  Family meeting to re discuss code status and goals of care scheduled for 11: 30 AM on 01-04-22 with palliative care. Further recommendations to follow.  Thank you for the consult.   Code Status/Advance Care Planning: Full code   Symptom Management:  Palliative Prophylaxis:  Frequent Pain Assessment  Additional Recommendations (Limitations, Scope, Preferences): Full Scope Treatment  Psycho-social/Spiritual:  Desire for further Chaplaincy support:yes Additional Recommendations: Caregiving  Support/Resources  Prognosis:  Unable to determine  Discharge Planning: To Be Determined      Primary Diagnoses: Present on Admission:  SBO (small bowel obstruction) from carcinomatosis  Cancer of right colon ([facility])  HTN (hypertension)  Small bowel obstruction ([place])   I have reviewed the medical record,  interviewed the patient and family, and examined the patient. The following aspects are pertinent.  Past Medical History:  Diagnosis Date   Anemia    Colon cancer ([facility])    cecum   Heart murmur    Hypertension    Social History   Socioeconomic History   Marital status: Married    Spouse name: Not on file   Number of children: Not on file   Years of education: Not on file   Highest education level: Not on file  Occupational History   Not on file  Tobacco Use   Smoking status: Former    Packs/day: 0.50    Years: 5.00    Pack years: 2.50    Types: Cigarettes    Quit date: 08/13/1993    Years since quitting: 28.4   Smokeless tobacco: Never  Vaping Use   Vaping Use: Never used  Substance and Sexual Activity   Alcohol use: Not Currently    Comment: social drinker   Drug use: Never   Sexual activity: Not on file  Other Topics Concern   Not on file  Social History Narrative   Not on file   Social Determinants of Health   Financial Resource Strain: Not on file  Food Insecurity: Not on file  Transportation Needs: Not on file  Physical Activity: Not on file  Stress: Not on file  Social Connections: Not on file   Family History  Problem Relation Age of Onset   Stroke Mother    Hypertension Mother    Stroke Father    Hypertension Father    Scheduled Meds:  amLODipine  10 mg Per Tube Daily   Chlorhexidine Gluconate Cloth  6 each Topical Daily   DULoxetine  20 mg Oral Daily   lisinopril  5 mg Per Tube Daily   pantoprazole sodium  40 mg Per Tube Daily   sucralfate  1 g Per Tube QID   Continuous Infusions:  sodium chloride 75 mL/hr at 01/03/22 0304   PRN Meds:.ondansetron (ZOFRAN) IV Medications Prior to Admission:  Prior to Admission medications   Medication Sig Start Date End Date Taking? Authorizing Provider  amLODipine (NORVASC) 10 MG tablet Take 1 tablet (10 mg total) by mouth daily. 11/24/21  Yes [name], MD  DULoxetine (CYMBALTA) 20 MG capsule Take 1  capsule (20 mg total) by mouth daily. 12/13/21  Yes Memon, Jolaine Artist, MD  KLOR-CON M20 20 MEQ tablet TAKE 1 TABLET BY MOUTH EVERY DAY Patient taking differently: Take 20 mEq by mouth daily as needed (only after having blood work done). 11/13/21  Yes [name], MD  lactulose (CHRONULAC) 10 GM/15ML solution Take 30 mLs (20 g total) by mouth every 4 (four) hours as needed for severe constipation. Reduce dose or stop when you have diarrhea 12/29/21  Yes [name], MD  lidocaine-prilocaine (EMLA) cream Apply 1 application. topically as needed. Patient taking differently: Apply 1 application. topically as needed (for port access). 10/18/21  Yes [name], MD  lisinopril (ZESTRIL) 5 MG tablet TAKE 1  TABLET (5 MG TOTAL) BY MOUTH DAILY. Patient taking differently: Take 5 mg by mouth daily. 09/04/21  Yes [name], MD  LORazepam (ATIVAN) 0.5 MG tablet Take 1 tablet (0.5 mg total) by mouth every 8 (eight) hours as needed (Anxiety, Cancer Chemotherapy-Induced Nausea and Vomiting). 12/14/21  Yes [name], MD  sucralfate (CARAFATE) 1 g tablet TAKE 1 TABLET BY MOUTH 4 TIMES DAILY - WITH MEALS AND AT BEDTIME. Patient taking differently: Take 1 g by mouth 4 (four) times daily. 12/20/21  Yes [name], MD  ondansetron (ZOFRAN) 8 MG tablet Take 1 tablet (8 mg total) by mouth every 6 (six) hours as needed for nausea or vomiting. Patient not taking: Reported on 01/01/2022 12/12/21   Kathie Dike, MD  pantoprazole (PROTONIX) 40 MG tablet TAKE 1 TABLET BY MOUTH EVERY DAY 01/01/22   [name], MD  trifluridine-tipiracil (LONSURF) 20-8.19 MG tablet Take 3 tablets (60 mg of trifluridine total) by mouth 2 (two) times daily after a meal. 1 hr after AM & PM meals on days 1-5, 8-12. Repeat every 28day 12/27/21   [name], MD   Allergies  Allergen Reactions   Demerol [Meperidine Hcl] Nausea Only   Review of Systems Denies pain.   Physical Exam Thin appearing lady awake alert resting in bed Has NG tube with minimal output Abdomen is  not distended Regular work of breathing No edema Reports right lower quadrant discomfort at times  Vital Signs: BP 134/71 (BP Location: Left Arm)   Pulse 84   Temp 98.4 F (36.9 C) (Oral)   Resp 17   Ht '5\' 4"'$  (1.626 m)   Wt 54.6 kg   SpO2 99%   BMI 20.66 kg/m  Pain Scale: 0-10   Pain Score: 0-No pain   SpO2: SpO2: 99 % O2 Device:SpO2: 99 % O2 Flow Rate: .   IO: Intake/output summary:  Intake/Output Summary (Last 24 hours) at 01/03/2022 1000 Last data filed at 01/03/2022 0958 Gross per 24 hour  Intake 746.19 ml  Output 900 ml  Net -153.81 ml    LBM: Last BM Date : 12/28/21 Baseline Weight: Weight: 54.6 kg Most recent weight: Weight: 54.6 kg     Palliative Assessment/Data:   PPS 50%  Time In:  9 Time Out: 10.10  Time Total:  70 min.  Greater than 50%  of this time was spent counseling and coordinating care related to the above assessment and plan.  Signed by: Loistine Chance, MD   Please contact Palliative Medicine Team phone at [phone] for questions and concerns.  For individual provider: See Shea Evans

## 2022-01-03 NOTE — Progress Notes (Signed)
[facility] PROGRESS NOTE   Eshaal Duby PIR:[id number] DOB: 1951-05-24 DOA: 01/01/2022  PCP: Pcp, No  Brief History/Interval Summary: 71 y.o. 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  Found to have small bowel obstruction.  Was hospitalized for further management.  Consultants: Medical oncology.  General surgery.  Palliative care.  Procedures: NG tube placement    Subjective/Interval History: Patient mentions that she does not have any nausea currently.  Abdominal pain has improved.  Had multiple questions about her disease process which were answered.    Assessment/Plan:  Small bowel obstruction in the setting of colon cancer Obstruction seems to be caused by the cancerous process.  Patient seen by general surgery.  No surgical options are available at this time.  Patient currently has NG tube. Palliative care has been consulted to discuss goals of care.  Gastrostomy tube for palliative purposes is being considered.  Metastatic colon cancer Progression of disease noted on CT scan.  Medical oncology is following.  Palliative care has been consulted. Per oncology note does not appear that patient is a candidate for any further chemotherapy.  Essential hypertension Monitor blood pressures closely.  Noted to be on amlodipine and ACE inhibitor.  Pressure is reasonably well controlled.  Hyponatremia Likely due to hypovolemia.  Improved  Normocytic anemia No evidence of overt bleeding.  Recheck labs tomorrow.   DVT Prophylaxis: SCDs Code Status: Full code Family Communication: Discussed with patient and her son Disposition Plan: To be determined  Status is: Inpatient Remains inpatient appropriate because: Small bowel obstruction requiring NG tube      Medications: Scheduled:  amLODipine  10 mg Per Tube Daily   Chlorhexidine Gluconate Cloth   6 each Topical Daily   DULoxetine  20 mg Oral Daily   lisinopril  5 mg Per Tube Daily   pantoprazole sodium  40 mg Per Tube Daily   sucralfate  1 g Per Tube QID   Continuous:  sodium chloride 75 mL/hr at 01/03/22 0304   YTK:ZSWFUXNATFT (ZOFRAN) IV  Antibiotics: Anti-infectives (From admission, onward)    None       Objective:  Vital Signs  Vitals:   01/02/22 0544 01/02/22 1629 01/02/22 2244 01/03/22 0534  BP: 130/73 140/75 130/66 134/71  Pulse: 81 94 85 84  Resp: '14 16 17 17  '$ Temp: 97.9 F (36.6 C) 98.1 F (36.7 C) 97.9 F (36.6 C) 98.4 F (36.9 C)  TempSrc: Oral Oral Oral Oral  SpO2: 97% 99% 100% 99%  Weight:      Height:        Intake/Output Summary (Last 24 hours) at 01/03/2022 1013 Last data filed at 01/03/2022 0958 Gross per 24 hour  Intake 746.19 ml  Output 900 ml  Net -153.81 ml   Filed Weights   01/01/22 2101  Weight: 54.6 kg    General appearance: Awake alert.  In no distress Resp: Clear to auscultation bilaterally.  Normal effort Cardio: S1-S2 is normal regular.  No S3-S4.  No rubs murmurs or bruit GI: Abdomen is soft.  Mildly distended.  Mildly tender diffusely without any rebound rigidity or guarding.  No masses organomegaly.  Bowel sounds very sluggish. Extremities: No edema.  Full range of motion of lower extremities. Neurologic: Alert and oriented x3.  No focal neurological deficits.    Lab Results:  Data Reviewed: I have personally reviewed following labs and reports of the imaging studies  CBC:  Recent Labs  Lab 12/29/21 1420 01/01/22 1637  WBC 3.3* 4.9  NEUTROABS 2.1 3.7  HGB 12.8 11.9*  HCT 36.8 34.5*  MCV 92.0 93.5  PLT 252 937    Basic Metabolic Panel: Recent Labs  Lab 12/29/21 1420 01/01/22 1637 01/02/22 0939  NA 136 129* 133*  K 3.8 4.1 3.9  CL 96* 93* 100  CO2 '27 27 25  '$ GLUCOSE 94 120* 87  BUN 9 10 7*  CREATININE 0.60 0.61 0.45  CALCIUM 10.0 9.4 9.0    GFR: Estimated Creatinine Clearance: 55.6 mL/min  (by C-G formula based on SCr of 0.45 mg/dL).  Liver Function Tests: Recent Labs  Lab 12/29/21 1420 01/01/22 1637  AST 12* 14*  ALT 8 10  ALKPHOS 64 62  BILITOT 0.9 0.5  PROT 8.5* 7.9  ALBUMIN 4.2 3.9    Recent Labs  Lab 01/01/22 1637  LIPASE 32     Radiology Studies: DG Abdomen 1 View  Result Date: 01/01/2022 CLINICAL DATA:  NG tube placement. EXAM: ABDOMEN - 1 VIEW COMPARISON:  CT examination dated Jan 01, 2022 FINDINGS: NG tube with distal tip projecting over the gastric body. Multiple dilated small bowel loops suggesting ileus or obstruction. No radio-opaque calculi or other significant radiographic abnormality are seen. IMPRESSION: 1. NG tube with distal tip projecting over the body of the stomach. 2. Multiple dilated small bowel loops suggesting ileus or obstruction. Electronically Signed   By: Keane Police D.O.   On: 01/01/2022 19:25   CT ABDOMEN PELVIS W CONTRAST  Result Date: 01/01/2022 CLINICAL DATA:  Abdominal pain, nausea, history of colon cancer EXAM: CT ABDOMEN AND PELVIS WITH CONTRAST TECHNIQUE: Multidetector CT imaging of the abdomen and pelvis was performed using the standard protocol following bolus administration of intravenous contrast. RADIATION DOSE REDUCTION: This exam was performed according to the departmental dose-optimization program which includes automated exposure control, adjustment of the mA and/or kV according to patient size and/or use of iterative reconstruction technique. CONTRAST:  30m OMNIPAQUE IOHEXOL 300 MG/ML  SOLN COMPARISON:  12/08/2021 FINDINGS: Lower chest: Bilateral lower lobe pulmonary nodules are again identified. 6 mm right lower lobe pulmonary nodule image 2/6, and 10 mm left lower lobe pulmonary nodule image 17/6, not appreciably changed since prior study. Findings are consistent with pulmonary metastases. No acute airspace disease, effusion, or pneumothorax. Hepatobiliary: Stable 10 mm hypodense nodule superior aspect right lobe liver  image 10/2. Metastatic disease is not excluded. The remainder of the liver is grossly unremarkable. Gallbladder is moderately distended, with minimal sludge or small noncalcified stones layering dependently within the gallbladder lumen. No gallbladder wall thickening or pericholecystic fluid. Pancreas: Unremarkable. No pancreatic ductal dilatation or surrounding inflammatory changes. Spleen: Normal in size without focal abnormality. Adrenals/Urinary Tract: Adrenal glands are unremarkable. Kidneys are normal, without renal calculi, focal lesion, or hydronephrosis. Bladder is unremarkable. Stomach/Bowel: There is an enlarging ill-defined soft tissue mass within the right lower quadrant at the level of the ileocecal valve, measuring up to 4.7 x 3.6 cm. This results in high-grade small bowel obstruction, with a proximal small bowel measuring up to 3.9 cm in diameter, with multiple gas fluid levels. Mild wall thickening of the distal ileum persists. Mild gas and stool throughout the colon to the level of the rectum. Diffuse distal colonic diverticulosis without evidence of diverticulitis. Vascular/Lymphatic: Aortic atherosclerosis. No discrete pathologic adenopathy within the abdomen or pelvis. Reproductive: Status post hysterectomy. No adnexal masses. Other: Aggressive nodularity within the ventral omentum in the lower abdomen and pelvis  again noted, consistent with progressive omental metastatic disease. No free fluid or free intraperitoneal gas. There is a small fat containing umbilical hernia, with omental nodularity contain within the umbilical hernia site. Musculoskeletal: No acute or destructive bony lesions. Reconstructed images demonstrate no additional findings. IMPRESSION: 1. Enlarging soft tissue mass within the right lower quadrant, centered at the level of the ileocecal valve, resulting in high-grade small bowel obstruction. Findings are consistent with progressive malignancy. 2. Progressive omental  nodularity within the lower abdomen and pelvis consistent with worsening metastatic disease. 3. Stable bilateral pulmonary nodules and right lobe hepatic hypodensity, consistent with metastatic disease. 4. Distal colonic diverticulosis without diverticulitis. 5.  Aortic Atherosclerosis (ICD10-I70.0). Electronically Signed   By: Randa Ngo M.D.   On: 01/01/2022 18:10       LOS: 1 day   [facility] Pager on [email or web address]  01/03/2022, 10:13 AM

## 2022-01-03 NOTE — Telephone Encounter (Signed)
Oral Oncology Patient Advocate Encounter  Application process halted pending therapy review.   Kari Patel, CPhT-Adv Pharmacy Patient Advocate Specialist [facility] Patient Advocate Team Direct Number: [phone]  Fax: [phone]

## 2022-01-04 DIAGNOSIS — Z7189 Other specified counseling: Secondary | ICD-10-CM | POA: Diagnosis not present

## 2022-01-04 DIAGNOSIS — R531 Weakness: Secondary | ICD-10-CM | POA: Diagnosis not present

## 2022-01-04 DIAGNOSIS — C182 Malignant neoplasm of ascending colon: Secondary | ICD-10-CM | POA: Diagnosis not present

## 2022-01-04 DIAGNOSIS — K56609 Unspecified intestinal obstruction, unspecified as to partial versus complete obstruction: Secondary | ICD-10-CM | POA: Diagnosis not present

## 2022-01-04 DIAGNOSIS — E871 Hypo-osmolality and hyponatremia: Secondary | ICD-10-CM | POA: Diagnosis not present

## 2022-01-04 LAB — CBC
HCT: 31.1 % — ABNORMAL LOW (ref 36.0–46.0)
Hemoglobin: 10.5 g/dL — ABNORMAL LOW (ref 12.0–15.0)
MCH: 32.2 pg (ref 26.0–34.0)
MCHC: 33.8 g/dL (ref 30.0–36.0)
MCV: 95.4 fL (ref 80.0–100.0)
Platelets: 196 10*3/uL (ref 150–400)
RBC: 3.26 MIL/uL — ABNORMAL LOW (ref 3.87–5.11)
RDW: 13.3 % (ref 11.5–15.5)
WBC: 4.6 10*3/uL (ref 4.0–10.5)
nRBC: 0 % (ref 0.0–0.2)

## 2022-01-04 LAB — BASIC METABOLIC PANEL
Anion gap: 14 (ref 5–15)
BUN: <5 mg/dL — ABNORMAL LOW (ref 8–23)
CO2: 21 mmol/L — ABNORMAL LOW (ref 22–32)
Calcium: 9.1 mg/dL (ref 8.9–10.3)
Chloride: 102 mmol/L (ref 98–111)
Creatinine, Ser: 0.53 mg/dL (ref 0.44–1.00)
GFR, Estimated: >60 mL/min (ref >60)
Glucose, Bld: 60 mg/dL — ABNORMAL LOW (ref 70–99)
Potassium: 3.6 mmol/L (ref 3.5–5.1)
Sodium: 137 mmol/L (ref 135–145)

## 2022-01-04 LAB — PROTIME-INR
INR: 1.3 — ABNORMAL HIGH (ref 0.8–1.2)
Prothrombin Time: 15.8 seconds — ABNORMAL HIGH (ref 11.4–15.2)

## 2022-01-04 MED ORDER — MELATONIN 3 MG PO TABS
3.0000 mg | ORAL_TABLET | Freq: Once | ORAL | Status: AC | PRN
  Administered 2022-01-04: 3 mg via ORAL
  Filled 2022-01-04: qty 1

## 2022-01-04 MED ORDER — POTASSIUM CHLORIDE IN NACL 20-0.9 MEQ/L-% IV SOLN
INTRAVENOUS | Status: DC
Start: 2022-01-04 — End: 2022-01-05
  Filled 2022-01-04 (×4): qty 1000

## 2022-01-04 MED ORDER — LORAZEPAM 0.5 MG PO TABS
0.5000 mg | ORAL_TABLET | Freq: Once | ORAL | Status: AC
  Administered 2022-01-04: 0.5 mg via ORAL
  Filled 2022-01-04: qty 1

## 2022-01-04 MED ORDER — PANTOPRAZOLE SODIUM 40 MG IV SOLR
40.0000 mg | Freq: Every day | INTRAVENOUS | Status: DC
  Administered 2022-01-04 – 2022-01-09 (×6): 40 mg via INTRAVENOUS
  Filled 2022-01-04 (×5): qty 10

## 2022-01-04 MED ORDER — POTASSIUM CHLORIDE 10 MEQ/100ML IV SOLN
10.0000 meq | INTRAVENOUS | Status: AC
  Administered 2022-01-04 (×2): 10 meq via INTRAVENOUS
  Filled 2022-01-04 (×2): qty 100

## 2022-01-04 NOTE — Progress Notes (Addendum)
HEMATOLOGY-ONCOLOGY PROGRESS NOTE  ASSESSMENT AND PLAN: 1.  Metastatic colon cancer 2.  Small bowel obstruction  -The patient has progressive metastatic colon cancer and now with recurrent small bowel obstruction. -The progressive nature of her disease have been discussed with the patient as well has her son who was at the bedside today.  Her husband is also at the bedside but has dementia and has limited understanding. -At this point, the patient is interested in pursuing a venting gastrostomy tube. -She would not be a candidate for additional chemotherapy and we would recommend hospice. -Palliative care following and continues to engage in goals of care discussion with the patient and her family.  [name], NP   SUBJECTIVE: Denies pain, nausea, vomiting today.  Met with palliative care and the patient is interested in proceeding with venting gastrostomy tube placement.  There are ongoing discussions regarding hospice but no decisions have been made at this point.  NG tube remains in place.  Oncology History Overview Note  Cancer Staging Cancer of right colon [facility]) Staging form: Colon and Rectum - Neuroendocine Tumors, AJCC 8th Edition - Clinical stage from 12/13/2020: Stage IV (cTX, cN1, cM1) - Signed by [name], MD on 03/01/2021    Cancer of right colon [facility])  12/07/2020 Procedure   Colonoscopy  Impression: - non-bleeding internal hemorrhoids - diverticulosis in entire examined colon - one 9 mm polyp in ascending colon removed with hot snare - one 13 mm polyp in transverse colon removed with hot fnare - likely malignant partially-obstructing tumor in cecum.   12/07/2020 Pathology Results   FINAL PATHOLOGIC DIAGNOSIS  SMALL BOWEL, BIOPSIES - duodenal mucosa within normal limits GASTRIC BODY AND ATRIUM, BIOPSY - gastric antral and body-type mucosa with moderate chronic Helicobacter pylori gastritis ASCENDING COLON, POLYPECTOMY - tubular adenoma with prominent  eosinophilic infiltrate CECUM, "MASS," BIOPSIES - moderately differentiated adenocarcinoma TRANSVERSE COLON, POLYPECTOMY - high grade dysplasia involving tubulovillous adenoma - cauterized margins negative   12/13/2020 Cancer Staging   Staging form: Colon and Rectum - Neuroendocine Tumors, AJCC 8th Edition - Clinical stage from 12/13/2020: Stage IV (cTX, cN1, cM1) - Signed by [name], MD on 03/01/2021   12/22/2020 Imaging   CT CAP  IMPRESSION: Large 7.5 cm cecal mass with surrounding nodularity/lymphadenopathy 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 The largest measures 6 mm in the RUL. Findings are nonspecific but can be followed per Fleischner criteria Hiatal hernia   03/01/2021 Initial Diagnosis   Cancer of right colon ([place])   03/10/2021 Imaging   CT CAP  IMPRESSION: 1. Large infiltrative cecal mass measuring approximately 7.1 cm, reflecting the known right-sided colonic neoplasm. 2. Similar extensive peritoneal/omental nodularity throughout the abdomen and pelvis with trace free fluid in the pelvis along the pericolic gutters, consistent with disease involvement. 3. Similar prominent and mildly enlarged retroperitoneal and mesenteric lymph nodes, most likely reflecting disease involvement. 4. Numerous scattered bilateral pulmonary nodules measuring up to 6 mm are similar to prior and nonspecific but suspicious for disease involvement. Attention on follow-up imaging suggested. 5. Similar mild right-sided hydronephrosis without hydroureter, with the distal ureter traversing peritoneal nodularity. Likely reflecting partial UPJ obstruction given lack of hydroureter. Attention on follow-up  imaging suggested. Cholelithiasis without findings of acute cholecystitis. 6.  Aortic Atherosclerosis (ICD10-I70.0).   03/13/2021 Pathology Results   FINAL MICROSCOPIC  DIAGNOSIS:   A. OMENTUM, NEEDLE CORE BIOPSY:  - Metastatic adenocarcinoma, consistent with clinical impression of a  colonic primary.  See comment    03/13/2021 Miscellaneous      03/16/2021 - 09/09/2021 Chemotherapy   Patient is on Treatment Plan : COLORECTAL FOLFOX + Bevacizumab q14d     06/05/2021 Imaging   CT CAP  IMPRESSION: Chest Impression:   1. Bilateral small pulmonary nodules appear unchanged. 2. No new nodularity. 3. No metastatic adenopathy.   Abdomen / Pelvis Impression:   1. Marked decrease in volume of circumferential mass surrounding the distal ileum leading up the terminal ileum. 2. Marked decrease in size of peritoneal implants along the ventral peritoneal surface of the lower abdomen. Persistent nodularity again much improved. 3. No evidence of liver metastasis.   09/15/2021 Imaging   CT CAP IMPRESSION: 1. Multiple scattered tiny bilateral pulmonary nodules are stable to minimally increased in size in the interval. There is a new 11 mm anterior left upper lobe pulmonary nodule on today's study. 2. Interval progression of soft tissue nodularity in the lower omentum with new disease now noted more cranially in the omentum. 3. Wall thickening in the terminal ileum appears qualitatively progressed in the interval with a string sign lumen on today's study. No features to suggest obstruction at this point. 4. Stable mild right hydronephrosis without associated hydroureter. 5. Colonic diverticulosis without diverticulitis. 6. Layering sludge or tiny stones in the gallbladder fundus. 7. Aortic Atherosclerosis (ICD10-I70.0).   09/21/2021 -  Chemotherapy   Patient is on Treatment Plan : COLORECTAL FOLFIRI / BEVACIZUMAB Q14D        REVIEW OF SYSTEMS:   Review of Systems  Constitutional:  Negative for  chills and fever.  Respiratory:  Negative for cough and shortness of breath.   Cardiovascular:  Negative for chest pain.  Gastrointestinal:  Positive for constipation. Negative for nausea and vomiting.  Neurological: Negative.   Endo/Heme/Allergies: Negative.    I have reviewed the past medical history, past surgical history, social history and family history with the patient and they are unchanged from previous note.   PHYSICAL EXAMINATION: ECOG PERFORMANCE STATUS: 2 - Symptomatic, <50% confined to bed  Vitals:   01/03/22 2028 01/04/22 0548  BP: (!) 122/56 (!) 150/58  Pulse: 76 74  Resp: 18 18  Temp: 98.2 F (36.8 C) 98.4 F (36.9 C)  SpO2: 100% 100%   Filed Weights   01/01/22 2101  Weight: 54.6 kg    Intake/Output from previous day: 05/24 0701 - 05/25 0700 In: 2576.2 [I.V.:2576.2] Out: 850 [Urine:850]  Physical Exam Vitals reviewed.  Constitutional:      General: She is not in acute distress. HENT:     Head: Normocephalic.  Cardiovascular:     Rate and Rhythm: Normal rate and regular rhythm.  Pulmonary:     Effort: Pulmonary effort is normal. No respiratory distress.  Abdominal:     Palpations: Abdomen is soft.     Comments: Minimally tender in RLQ, minimally distended.  Mass felt at her umbilicus as well as in her RLQ.   Skin:    General: Skin is warm and dry.  Neurological:     Mental Status: She is alert and oriented to person, place, and time.    LABORATORY DATA:  I have reviewed the data as listed    Latest Ref Rng & Units 01/04/2022    8:55 AM 01/02/2022    9:39 AM 01/01/2022    4:37 PM  CMP  Glucose  70 - 99 mg/dL 60   87   120    BUN 8 - 23 mg/dL '5   7   10    '$ Creatinine 0.44 - 1.00 mg/dL 0.53   0.45   0.61    Sodium 135 - 145 mmol/L 137   133   129    Potassium 3.5 - 5.1 mmol/L 3.6   3.9   4.1    Chloride 98 - 111 mmol/L 102   100   93    CO2 22 - 32 mmol/L $RemoveB'21   25   27    'tGVHLaTy$ Calcium 8.9 - 10.3 mg/dL 9.1   9.0   9.4    Total Protein 6.5 - 8.1  g/dL   7.9    Total Bilirubin 0.3 - 1.2 mg/dL   0.5    Alkaline Phos 38 - 126 U/L   62    AST 15 - 41 U/L   14    ALT 0 - 44 U/L   10      Lab Results  Component Value Date   WBC 4.6 01/04/2022   HGB 10.5 (L) 01/04/2022   HCT 31.1 (L) 01/04/2022   MCV 95.4 01/04/2022   PLT 196 01/04/2022   NEUTROABS 3.7 01/01/2022    Lab Results  Component Value Date   CEA1 5.56 (H) 12/29/2021    DG Abdomen 1 View  Result Date: 01/01/2022 CLINICAL DATA:  NG tube placement. EXAM: ABDOMEN - 1 VIEW COMPARISON:  CT examination dated Jan 01, 2022 FINDINGS: NG tube with distal tip projecting over the gastric body. Multiple dilated small bowel loops suggesting ileus or obstruction. No radio-opaque calculi or other significant radiographic abnormality are seen. IMPRESSION: 1. NG tube with distal tip projecting over the body of the stomach. 2. Multiple dilated small bowel loops suggesting ileus or obstruction. Electronically Signed   By: Keane Police D.O.   On: 01/01/2022 19:25   CT ABDOMEN PELVIS W CONTRAST  Result Date: 01/01/2022 CLINICAL DATA:  Abdominal pain, nausea, history of colon cancer EXAM: CT ABDOMEN AND PELVIS WITH CONTRAST TECHNIQUE: Multidetector CT imaging of the abdomen and pelvis was performed using the standard protocol following bolus administration of intravenous contrast. RADIATION DOSE REDUCTION: This exam was performed according to the departmental dose-optimization program which includes automated exposure control, adjustment of the mA and/or kV according to patient size and/or use of iterative reconstruction technique. CONTRAST:  43mL OMNIPAQUE IOHEXOL 300 MG/ML  SOLN COMPARISON:  12/08/2021 FINDINGS: Lower chest: Bilateral lower lobe pulmonary nodules are again identified. 6 mm right lower lobe pulmonary nodule image 2/6, and 10 mm left lower lobe pulmonary nodule image 17/6, not appreciably changed since prior study. Findings are consistent with pulmonary metastases. No acute airspace  disease, effusion, or pneumothorax. Hepatobiliary: Stable 10 mm hypodense nodule superior aspect right lobe liver image 10/2. Metastatic disease is not excluded. The remainder of the liver is grossly unremarkable. Gallbladder is moderately distended, with minimal sludge or small noncalcified stones layering dependently within the gallbladder lumen. No gallbladder wall thickening or pericholecystic fluid. Pancreas: Unremarkable. No pancreatic ductal dilatation or surrounding inflammatory changes. Spleen: Normal in size without focal abnormality. Adrenals/Urinary Tract: Adrenal glands are unremarkable. Kidneys are normal, without renal calculi, focal lesion, or hydronephrosis. Bladder is unremarkable. Stomach/Bowel: There is an enlarging ill-defined soft tissue mass within the right lower quadrant at the level of the ileocecal valve, measuring up to 4.7 x 3.6 cm. This results in high-grade small bowel obstruction, with a proximal  small bowel measuring up to 3.9 cm in diameter, with multiple gas fluid levels. Mild wall thickening of the distal ileum persists. Mild gas and stool throughout the colon to the level of the rectum. Diffuse distal colonic diverticulosis without evidence of diverticulitis. Vascular/Lymphatic: Aortic atherosclerosis. No discrete pathologic adenopathy within the abdomen or pelvis. Reproductive: Status post hysterectomy. No adnexal masses. Other: Aggressive nodularity within the ventral omentum in the lower abdomen and pelvis again noted, consistent with progressive omental metastatic disease. No free fluid or free intraperitoneal gas. There is a small fat containing umbilical hernia, with omental nodularity contain within the umbilical hernia site. Musculoskeletal: No acute or destructive bony lesions. Reconstructed images demonstrate no additional findings. IMPRESSION: 1. Enlarging soft tissue mass within the right lower quadrant, centered at the level of the ileocecal valve, resulting in  high-grade small bowel obstruction. Findings are consistent with progressive malignancy. 2. Progressive omental nodularity within the lower abdomen and pelvis consistent with worsening metastatic disease. 3. Stable bilateral pulmonary nodules and right lobe hepatic hypodensity, consistent with metastatic disease. 4. Distal colonic diverticulosis without diverticulitis. 5.  Aortic Atherosclerosis (ICD10-I70.0). Electronically Signed   By: Randa Ngo M.D.   On: 01/01/2022 18:10   CT CHEST ABDOMEN PELVIS W CONTRAST  Result Date: 12/10/2021 CLINICAL DATA:  Colon cancer restaging. EXAM: CT CHEST, ABDOMEN, AND PELVIS WITH CONTRAST TECHNIQUE: Multidetector CT imaging of the chest, abdomen and pelvis was performed following the standard protocol during bolus administration of intravenous contrast. RADIATION DOSE REDUCTION: This exam was performed according to the departmental dose-optimization program which includes automated exposure control, adjustment of the mA and/or kV according to patient size and/or use of iterative reconstruction technique. CONTRAST:  42mL OMNIPAQUE IOHEXOL 300 MG/ML  SOLN COMPARISON:  CT 09/15/2021 FINDINGS: CT CHEST FINDINGS Cardiovascular: Atherosclerosis of the thoracic aorta. Normal heart size. Coronary artery calcifications. Right chest port in place. No pericardial effusion. Mediastinum/Nodes: No enlarged mediastinal, hilar, or axillary lymph nodes. No supraclavicular adenopathy. 9 mm left thyroid nodule. Not clinically significant; no follow-up imaging recommended (ref: J Am Coll Radiol. 2015 Feb;12(2): 143-50).Minimal wall thickening of the gastroesophageal junction. Lungs/Pleura: Multiple pulmonary nodules, dominant nodules are as follows. -6 mm right upper lobe, series 6, image 26, previously 6 mm -5 mm subpleural left upper lobe series 6, image 33, previously 5 mm -4 mm anterior right upper lobe series 6, image 48, unchanged in size but now cavitary -13 x 10 mm left upper lobe  anteriorly series 6, image 62, previously 11 x 7 mm -6 mm right lower lobe series 6, image 69, previously 6 mm There are multiple additional millimetric nodules ladder not significantly changed. No confluent consolidation. No pleural effusion. Trachea and central bronchi are patent. Musculoskeletal: No destructive lytic or blastic osseous lesions. Stable osseous structures. Vascular calcifications in the anterior chest wall. No chest wall soft tissue lesions. CT ABDOMEN PELVIS FINDINGS Hepatobiliary: There is a subtle 9 mm hypodensity in the right hepatic lobe, series 2, image 38. This was not seen on the prior exam layering sludge or stones in the gallbladder. No pericholecystic inflammation. No biliary dilatation. Pancreas: No ductal dilatation or inflammation. No evidence of pancreatic mass. Spleen: Normal in size without focal abnormality. Adrenals/Urinary Tract: No adrenal nodule. Slight decreased right hydronephrosis without hydroureter. There is homogeneous enhancement. Slight decreased right renal excretion. Focal renal mass. Partially distended, unremarkable urinary bladder Stomach/Bowel: The stomach is unremarkable. Small bowel is prominent in caliber measuring up to 2.6 cm, enteric contrast is not reached the colon.  There is a moderate length of distal ileum in the pelvis with demonstrates wall thickening and edema, series 2, image 97. the soft tissue nodule adjacent to the terminal ileum is difficult to accurately measure as the adjacent bowel is unopacified, however measures approximately 3.2 x 2 cm, unchanged, series 2, image 93. Thickening in the region of the terminal ileum persists. Moderate volume of colonic stool with diffuse colonic diverticulosis, prominent in the sigmoid. There is no evidence of diverticulitis. Vascular/Lymphatic: Aortic atherosclerosis. Patent portal, splenic, and mesenteric veins. Hepatic duodenal node measures 11 mm, series 2, image 56, previously 10 mm, but subjectively  unchanged. There is a 6 mm central mesenteric node, series 2, image 78, not definitively seen on prior. Occasional additional prominent central mesenteric nodes. Reproductive: The uterus is surgically absent.  No adnexal mass. Other: Ill-defined soft tissue nodularity in the lower abdomen, right greater than left, appears progressed in the interim. Representative left anterior omental lesion measures 3.7 x 1.4 cm, series 2, image 91, grossly unchanged in size but increased in density. Abdominal anterior soft tissue nodule measures 8 mm, series 2, image 71, previously 6 mm. There is trace perihepatic ascites that is new. There is a small umbilical hernia containing fat and omental nodularity. Musculoskeletal: Stable punctate L1 bone island. No lytic or destructive osseous lesion. Degenerative subchondral cyst in the right acetabulum. No acute osseous findings. IMPRESSION: 1. Progressive soft tissue nodularity in the lower abdomen, right greater than left, consistent with progressive metastatic disease. 2. Ill-defined 9 mm hypodensity in the right hepatic lobe was not seen on the prior exam, suspicious for metastasis. 3. Multiple pulmonary nodules, with largest nodule slightly increasing in size from prior exam. The remainder of the nodules are stable. Some of the nodules are now cavitary indicating treatment response. 4. Wall thickening in the terminal ileum appears similar with stable adjacent soft tissue nodule. There is new wall thickening and perienteric edema in the distal ileum in the low pelvis that may represent enteritis. More proximal small bowel is prominent in caliber, and contrast does not reach the colon. Partial obstruction is considered. 5. Trace perihepatic ascites, new. 6. Slight decreased right hydronephrosis without hydroureter. 7. Colonic diverticulosis without acute inflammation. 8. Layering gallstones or sludge without inflammation. Aortic Atherosclerosis (ICD10-I70.0). Electronically Signed    By: Keith Rake M.D.   On: 12/10/2021 20:42   DG CHEST PORT 1 VIEW  Result Date: 12/11/2021 CLINICAL DATA:  Fever.  Metastatic colon cancer. EXAM: PORTABLE CHEST 1 VIEW COMPARISON:  CT chest 12/08/2021 FINDINGS: Right chest wall porta catheter tip overlies the superior aspect of the superior vena cava. Cardiac silhouette and mediastinal contours are within normal limits with mild calcification within aortic arch. A skin fold overlies the superior and lateral aspects of the left lung, with vascular marking seen peripheral to this no definite pneumothorax seen. The lungs are clear. Note is made of multiple bilateral pulmonary nodule seen on recent CT that are not as well visualized on radiographs, including right lung apex 6 mm anteromedial left upper lung 13 mm pulmonary nodules. No pleural effusion or pneumothorax. No acute skeletal abnormality. IMPRESSION:: IMPRESSION: 1. No acute lung process. 2. Multiple bilateral pulmonary nodules measuring up to 13 mm on the left and 6 mm on the right are better seen on prior CT. Electronically Signed   By: Yvonne Kendall M.D.   On: 12/11/2021 10:27   DG Abd Portable 1V  Result Date: 12/11/2021 CLINICAL DATA:  Small-bowel obstruction.  History of colon  cancer. EXAM: PORTABLE ABDOMEN - 1 VIEW COMPARISON:  Abdominopelvic CT 12/08/2021 FINDINGS: The bowel gas pattern is nonobstructive. There is no supine evidence of free intraperitoneal air or bowel wall thickening. Residual contrast is noted throughout colonic diverticula. There is new patchy airspace disease at the right lung base with a possible associated small right pleural effusion. Mild convex right lumbar scoliosis. Telemetry leads overlie the abdomen. IMPRESSION: Nonobstructive bowel gas pattern. New opacity overlying the right lung base suspicious for basilar infiltrate or atelectasis and a possible small right pleural effusion. Electronically Signed   By: Richardean Sale M.D.   On: 12/11/2021 08:00      Future Appointments  Date Time Provider [place]  02/05/2022  7:45 AM [facility] None  02/05/2022  8:20 AM [name], MD [facility] None  02/05/2022  9:15 AM [facility] INFUSION [facility] None      LOS: 2 days   Addendum  I have seen the patient, examined her. I agree with the assessment and and plan and have edited the notes.   Dannia has agreed with venting G-tube placement, she understands this is palliative purposes only, to empty her stomach and decrease her nausea feeling, and that she can take some oral liquid for pleasure.  Unfortunately given the small bowel obstruction and diffuse peritoneal metastasis, she is not a candidate for further chemotherapy, and I recommend palliative care and hospice.  Dr. Rowe Pavy had extensive discussion with patient and her sons this morning, they have agreed with DNR, and much more open to hospice now.  [place] told me she is walking with her sons on some logistics, and she wants me to call her son Ed tomorrow (he is busy today), which I will do. I will be happy to serve as her hospice attending.  I will be out of the country for 2 weeks from this weekend, please call my covering physician as needed.   [name]  01/04/2022

## 2022-01-04 NOTE — Progress Notes (Signed)
Kari Patel [id number] 13-Sep-1950  CARE TEAM:  PCP: Merryl Hacker, No  Outpatient Care Team: Patient Care Team: Pcp, No as PCP - General [name], MD as Consulting Physician (Oncology) Earl Gala, Deliah Goody, RN as Nurse Navigator (Oncology)  Inpatient Treatment Team: Treatment Team: Attending Provider: Bonnielee Haff, MD; Rounding Team: Joycelyn Das, MD; Consulting Physician: Edison Pace, Md, MD; Consulting Physician: [name], MD; Registered Nurse: Luanne Bras, RN; Technician: Wynema Birch, NT; Registered Nurse: Dorena Bodo, RN   Problem List:   Principal Problem:   SBO (small bowel obstruction) from carcinomatosis Active Problems:   Cancer of right colon ([place])   HTN (hypertension)   Hyponatremia   Small bowel obstruction ([place])   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  [facility] Stay = 2 days)  Plan:  -Patient asked complete instructed.  Symptoms somewhat improved with NG tube in place.  While my instinct is to try and help with surgery, I think that would be a very good idea.  She has metastasis in her abdominal wall periumbilically as well as caking on her omentum.  There is no good window to try and bring up a diverting ostomy and I suspect she has disease that will cause obstructions more proximally.  I think it is reasonable to do a palliative G-tube to help decompress the stomach and get the NG tube out since I worried that this will not resolve and gradually progress.  It is clear the patient has thought about things and she likes the idea of a palliative gastrostomy tube to get the NG tube out.  I placed an order for intermittent radiology to see if they feel it is possible.  Looking at the CAT scan, there seems to be a good window in the left upper quadrant and it would be less morbid to as opposed to be trying to do  things laparoscopically or open the risk metastasis at port sites leaking and delay of chemotherapy  Defer to medical oncology to see if there is a chance of salvage with chemotherapy to regroup.   -VTE prophylaxis- SCDs, etc -mobilize as tolerated to help recovery  Disposition: To be determined.  Most likely at least home health versus hospice versus skilled facility      I reviewed Consultant ,medical opncology notes, hospitalist notes, last 24 h vitals and pain scores, last 48 h intake and output, last 24 h labs and trends, and last 24 h imaging results. I have reviewed this patient's available data, including medical history, events of note, test results, etc as part of my evaluation.  A significant portion of that time was spent in counseling.  Care during the described time interval was provided by me.  This care required high  level of medical decision making.  01/04/2022    Subjective: (Chief complaint)  Nasogastric tube in place.  Feels less abdominal pressure without in place.  Son in room.  Denies much abdominal pain  Objective:  Vital signs:  Vitals:   01/03/22 0534 01/03/22 1414 01/03/22 2028 01/04/22 0548  BP: 134/71 (!) 141/62 (!) 122/56 (!) 150/58  Pulse: 84 80 76 74  Resp: '17 16 18 18  '$ Temp: 98.4 F (36.9 C) 98 F (36.7 C) 98.2 F (36.8 C) 98.4 F (36.9 C)  TempSrc: Oral Oral Oral Oral  SpO2: 99% 98% 100% 100%  Weight:      Height:        Last BM Date : 12/28/21  Intake/Output   Yesterday:  05/24 0701 - 05/25 0700 In: 7408.1 [I.V.:2576.2] Out: 850 [Urine:850] This shift:  No intake/output data recorded.  Bowel function:  Flatus: No  BM:  No  Drain: Gastric tube with bilious output.  Mildly   Physical Exam:  General: Pt awake/alert in no acute distress Eyes: PERRL, normal EOM.  Sclera clear.  No icterus Neuro: CN II-XII intact w/o focal sensory/motor deficits. Lymph: No head/neck/groin lymphadenopathy Psych:  No  delerium/psychosis/paranoia.  Oriented x 4 HENT: Normocephalic, Mucus membranes moist.  No thrush Neck: Supple, No tracheal deviation.  No obvious thyromegaly Chest: No pain to chest wall compression.  Good respiratory excursion.  No audible wheezing CV:  Pulses intact.  Regular rhythm.  No major extremity edema MS: Normal AROM mjr joints.  No obvious deformity  Abdomen: Somewhat firm.  Moderately distended.  Nontender.  No evidence of peritonitis.  No incarcerated hernias. Nodularity the subcutaneous tissues of the abdominal wall felt periumbilically that seems to correlate with probable metastasis on CT scan  Ext:   No deformity.  No mjr edema.  No cyanosis Skin: No petechiae / purpurea.  No major sores.  Warm and dry    Results:   Cultures: No results found for this or any previous visit (from the past 720 hour(s)).  Labs: Results for orders placed or performed during the hospital encounter of 01/01/22 (from the past 48 hour(s))  Basic metabolic panel     Status: Abnormal   Collection Time: 01/02/22  9:39 AM  Result Value Ref Range   Sodium 133 (L) 135 - 145 mmol/L   Potassium 3.9 3.5 - 5.1 mmol/L   Chloride 100 98 - 111 mmol/L   CO2 25 22 - 32 mmol/L   Glucose, Bld 87 70 - 99 mg/dL    Comment: Glucose reference range applies only to samples taken after fasting for at least 8 hours.   BUN 7 (L) 8 - 23 mg/dL   Creatinine, Ser 0.45 0.44 - 1.00 mg/dL   Calcium 9.0 8.9 - 10.3 mg/dL   GFR, Estimated >60 >60 mL/min    Comment: (NOTE) Calculated using the CKD-EPI Creatinine Equation (2021)    Anion gap 8 5 - 15    Comment: Performed at [facility], [place] [address] [place], [place] [address]    Imaging / Studies: No results found.  Medications / Allergies: per chart  Antibiotics: Anti-infectives (From admission, onward)    Start     Dose/Rate Route Frequency Ordered Stop   01/04/22 1500  ceFAZolin (ANCEF) IVPB 2g/100 mL premix        2 g 200 mL/hr  over 30 Minutes Intravenous To Radiology 01/03/22 1344 01/05/22 1500         Note: Portions of this report may have been transcribed using voice recognition software. Every effort was made to ensure accuracy; however, inadvertent computerized transcription errors may be present.   Any transcriptional errors that result from this process are unintentional.    Adin Hector, MD, FACS, MASCRS Esophageal, Gastrointestinal & Colorectal Surgery Robotic and Minimally Invasive Surgery  [facility], [place]  [place]. [address] [place], [place] [address] [phone] Fax [phone] Main  CONTACT INFORMATION:  Weekday (9AM-5PM): Call [facility] main office at [phone]  Weeknight (5PM-9AM) or Weekend/Holiday: Check [email or web address] (password " [email or web address]")  for General Surgery [facility] coverage  (Please, do not use SecureChat as it is not reliable communication to operating surgeons for immediate patient care)      01/04/2022  7:46 AM

## 2022-01-04 NOTE — Progress Notes (Signed)
Daily Progress Note   Patient Name: Kari Patel       Date: 01/04/2022 DOB: 1951-04-21  Age: 71 y.o. MRN#: [id number] Attending Physician: Kari Haff, MD Primary Care Physician: Pcp, No Admit Date: 01/01/2022  Reason for Consultation/Follow-up: Establishing goals of care  Subjective: Patient is awake alert sitting up in a chair.  Has NG tube which is draining today.  Patient's son and husband are at the bedside and CODE STATUS and goals of care discussion was held, see below.  Length of Stay: 2  Current Medications: Scheduled Meds:   Chlorhexidine Gluconate Cloth  6 each Topical Daily   pantoprazole (PROTONIX) IV  40 mg Intravenous Daily    Continuous Infusions:  0.9 % NaCl with KCl 20 mEq / L 75 mL/hr at 01/04/22 1134    ceFAZolin (ANCEF) IV      PRN Meds: acetaminophen, ondansetron (ZOFRAN) IV, phenol  Physical Exam         Awake alert oriented No acute distress Minimal tenderness right lower quadrant Mild distention of abdomen Has NG tube Regular work of breathing Mood and affect within normal limits S1-S2  Vital Signs: BP (!) 147/81 (BP Location: Right Arm)   Pulse 82   Temp 98.3 F (36.8 C) (Oral)   Resp 18   Ht 5' 4" (1.626 m)   Wt 54.6 kg   SpO2 100%   BMI 20.66 kg/m  SpO2: SpO2: 100 % O2 Device: O2 Device: Room Air O2 Flow Rate:    Intake/output summary:  Intake/Output Summary (Last 24 hours) at 01/04/2022 1354 Last data filed at 01/04/2022 1000 Gross per 24 hour  Intake 2576.21 ml  Output 850 ml  Net 1726.21 ml   LBM: Last BM Date : 12/28/21 Baseline Weight: Weight: 54.6 kg Most recent weight: Weight: 54.6 kg       Palliative Assessment/Data:      Patient Active Problem List   Diagnosis Date Noted   Small bowel obstruction ([place])  01/02/2022   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 01/02/2022   SBO (small bowel obstruction) from carcinomatosis 01/01/2022   Hyponatremia 01/01/2022   Enteritis 12/11/2021   HTN (hypertension) 12/11/2021   Malnutrition of moderate degree 12/11/2021   Partial small bowel obstruction ([place]) 12/10/2021   Port-A-Cath in place 06/07/2021   Iron deficiency anemia due to chronic  blood loss 03/06/2021   Cancer of right colon [facility]) 03/01/2021    Palliative Care Assessment & Plan   Patient Profile:    Assessment: Progressive metastatic colon cancer Recurrent small bowel obstruction   Recommendations/Plan: Palliative medicine team consulted for CODE STATUS and for participation in ongoing goals of care discussions and for assistance with complex decision making.  I met with the patient, husband (who could not participate much due to his history of dementia) and his son Kari Patel at bedside.  Introduced scope of inpatient palliative services.  CODE STATUS and broad goals of care discussions undertaken.  Hospitalization course thus far discussed in detail.  Goals wishes and values attempted to be explored. It is important to the patient and family to figure out how she will get her nutrition.  We spent a long time today talking about role and scope of nutrition in the face of seriously progressive cancer.  Discussed about the concept of " pleasure feeds" where in the patient takes it in sips/bites of liquids/soft foods only as a comfort measure. Discussed about scope of current hospitalization.  Patient is to undergo venting gastrostomy.  To the best of my ability, explained about the role and purpose of pursuing a venting gastrostomy tube.  Life limiting illness is progressive metastatic colon cancer with recurrent small bowel obstruction and with recent imaging evidence of ongoing progression.  We spent some time talking about differences between full code/full scope care versus DNR/DNI.   Explained that it would be our medical recommendation for the patient to consider DNR/DNI.  Patient would like to discuss further with her family with regards to her CODE STATUS. Additionally today, we spent some time talking about differences between hospice versus palliative mode of care.  Discussed about how hospice can support the patient at this stage of her illness.  Differences between home with hospice versus residential hospice explored.  Described to her about hospice philosophy of care to the best of my ability.  Offered support active listening and compassionate presence. Plan remains to continue full CODE STATUS for now and the patient to discuss this further with the family about DNR/DNI.  Plan remains for the patient to undergo venting gastrostomy, to see if she can be started on some kind of a p.o. diet even if it is liquids and to monitor her symptom burden while the patient and family explore next steps with regards to whether or not they would like to pursue hospice services.  Palliative medicine team to continue to follow along and help guide complex medical decision making.  Goals of Care and Additional Recommendations: Limitations on Scope of Treatment: Full Scope Treatment  Code Status:    Code Status Orders  (From admission, onward)           Start     Ordered   01/01/22 2333  Full code  Continuous        01/01/22 2332           Code Status History     Date Active Date Inactive Code Status Order ID Comments User Context   12/11/2021 0428 12/12/2021 2203 Full Code [id number]  Kari Leff, MD Inpatient   12/11/2021 0208 12/11/2021 0428 Full Code [id number]  Mansy, Arvella Merles, MD Inpatient      Advance Directive Documentation    Flowsheet Row Most Recent Value  Type of Advance Directive Healthcare Power of Attorney, Living will  Pre-existing out of facility DNR order (yellow form or pink MOST form) --  "  MOST" Form in Place? --       Prognosis:  Unable to  determine  Discharge Planning: To Be Determined  Care plan was discussed with  patient, son Kari Patel and husband.   Thank you for allowing the Palliative Medicine Team to assist in the care of this patient.   Time In: 11 Time Out: 11.55 Total Time 55 Prolonged Time Billed  no       Greater than 50%  of this time was spent counseling and coordinating care related to the above assessment and plan.  Kari Chance, MD  Please contact Palliative Medicine Team phone at [phone] for questions and concerns.

## 2022-01-04 NOTE — Plan of Care (Signed)
  Problem: Coping: Goal: Level of anxiety will decrease Outcome: Not Progressing   

## 2022-01-04 NOTE — Progress Notes (Signed)
[facility] PROGRESS NOTE   Kari Patel ESP:[id number] DOB: [birth year] DOA: 01/01/2022  PCP: Pcp, No  Brief History/Interval Summary: 71 y.o. 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  Found to have small bowel obstruction.  Was hospitalized for further management.  Consultants: Medical oncology.  General surgery.  Palliative care.  Procedures: NG tube placement    Subjective/Interval History: Patient mentions that she is feeling better today compared to yesterday.  Abdominal pain has improved.  No nausea.  Son is at the bedside.  Multiple questions were asked which were answered to the best of my ability.  Many questions had to be deferred to medical oncology and palliative care.    Assessment/Plan:  Small bowel obstruction in the setting of colon cancer Obstruction seems to be caused by the cancerous process.  Patient seen by general surgery.  No surgical options are available at this time.  Patient currently has NG tube. Palliative care has been consulted to discuss goals of care.   Gastrostomy tube for palliative purposes is being considered.  Seen by interventional radiology.  Plan is for procedure either later today or tomorrow.  Metastatic colon cancer Progression of disease noted on CT scan.  Medical oncology is following.  Palliative care has been consulted. Per oncology note does not appear that patient is a candidate for any further chemotherapy. Goals of care conversation to occur today.  Medical oncology to discuss with patient and family this afternoon.  Likely a hospice candidate.  Essential hypertension Noted to be on amlodipine and ACE inhibitor.  Blood pressure with occasional high reading.  Continue to monitor.    Hyponatremia Likely due to hypovolemia.  labs from today are pending.  Normocytic anemia No evidence of overt  bleeding.  Labs are pending.   DVT Prophylaxis: SCDs Code Status: Full code Family Communication: Discussed with patient and her son Disposition Plan: To be determined.  Mobilize.  Status is: Inpatient Remains inpatient appropriate because: Small bowel obstruction requiring NG tube      Medications: Scheduled:  amLODipine  10 mg Per Tube Daily   Chlorhexidine Gluconate Cloth  6 each Topical Daily   DULoxetine  20 mg Oral Daily   lisinopril  5 mg Per Tube Daily   pantoprazole sodium  40 mg Per Tube Daily   sucralfate  1 g Per Tube QID   Continuous:  sodium chloride 75 mL/hr at 01/04/22 0531    ceFAZolin (ANCEF) IV     [id number], ondansetron (ZOFRAN) IV, phenol  Antibiotics: Anti-infectives (From admission, onward)    Start     Dose/Rate Route Frequency Ordered Stop   01/04/22 1500  ceFAZolin (ANCEF) IVPB 2g/100 mL premix        2 g 200 mL/hr over 30 Minutes Intravenous To Radiology 01/03/22 1344 01/05/22 1500       Objective:  Vital Signs  Vitals:   01/03/22 0534 01/03/22 1414 01/03/22 2028 01/04/22 0548  BP: 134/71 (!) 141/62 (!) 122/56 (!) 150/58  Pulse: 84 80 76 74  Resp: '17 16 18 18  '$ Temp: 98.4 F (36.9 C) 98 F (36.7 C) 98.2 F (36.8 C) 98.4 F (36.9 C)  TempSrc: Oral Oral Oral Oral  SpO2: 99% 98% 100% 100%  Weight:      Height:        Intake/Output Summary (Last 24 hours) at 01/04/2022 0850 Last data filed at 01/04/2022 0330 Gross per 24  hour  Intake 2576.21 ml  Output 450 ml  Net 2126.21 ml    Filed Weights   01/01/22 2101  Weight: 54.6 kg    General appearance: Awake alert.  In no distress Resp: Clear to auscultation bilaterally.  Normal effort Cardio: S1-S2 is normal regular.  No S3-S4.  No rubs murmurs or bruit GI: Abdomen is soft.  Nontender.  Nondistended.  Bowel sounds sluggish but present.  No masses organomegaly. Extremities: No edema.  Full range of motion of lower extremities. Neurologic: Alert and oriented x3.  No  focal neurological deficits.     Lab Results:  Data Reviewed: I have personally reviewed following labs and reports of the imaging studies  CBC: Recent Labs  Lab 12/29/21 1420 01/01/22 1637  WBC 3.3* 4.9  NEUTROABS 2.1 3.7  HGB 12.8 11.9*  HCT 36.8 34.5*  MCV 92.0 93.5  PLT 252 234     Basic Metabolic Panel: Recent Labs  Lab 12/29/21 1420 01/01/22 1637 01/02/22 0939  NA 136 129* 133*  K 3.8 4.1 3.9  CL 96* 93* 100  CO2 '27 27 25  '$ GLUCOSE 94 120* 87  BUN 9 10 7*  CREATININE 0.60 0.61 0.45  CALCIUM 10.0 9.4 9.0     GFR: Estimated Creatinine Clearance: 55.6 mL/min (by C-G formula based on SCr of 0.45 mg/dL).  Liver Function Tests: Recent Labs  Lab 12/29/21 1420 01/01/22 1637  AST 12* 14*  ALT 8 10  ALKPHOS 64 62  BILITOT 0.9 0.5  PROT 8.5* 7.9  ALBUMIN 4.2 3.9     Recent Labs  Lab 01/01/22 1637  LIPASE 32      Radiology Studies: No results found.     LOS: 2 days   [facility] Pager on [email or web address]  01/04/2022, 8:50 AM

## 2022-01-05 ENCOUNTER — Inpatient Hospital Stay (HOSPITAL_COMMUNITY): Payer: Medicare HMO

## 2022-01-05 ENCOUNTER — Other Ambulatory Visit: Payer: Self-pay | Admitting: Hematology

## 2022-01-05 DIAGNOSIS — Z7189 Other specified counseling: Secondary | ICD-10-CM | POA: Diagnosis not present

## 2022-01-05 DIAGNOSIS — D649 Anemia, unspecified: Secondary | ICD-10-CM | POA: Diagnosis not present

## 2022-01-05 DIAGNOSIS — I10 Essential (primary) hypertension: Secondary | ICD-10-CM | POA: Diagnosis not present

## 2022-01-05 DIAGNOSIS — K56609 Unspecified intestinal obstruction, unspecified as to partial versus complete obstruction: Secondary | ICD-10-CM | POA: Diagnosis not present

## 2022-01-05 DIAGNOSIS — R531 Weakness: Secondary | ICD-10-CM | POA: Diagnosis not present

## 2022-01-05 DIAGNOSIS — C182 Malignant neoplasm of ascending colon: Secondary | ICD-10-CM | POA: Diagnosis not present

## 2022-01-05 HISTORY — PX: IR GASTROSTOMY TUBE MOD SED: IMG625

## 2022-01-05 LAB — GLUCOSE, CAPILLARY
Glucose-Capillary: 118 mg/dL — ABNORMAL HIGH (ref 70–99)
Glucose-Capillary: 145 mg/dL — ABNORMAL HIGH (ref 70–99)
Glucose-Capillary: 150 mg/dL — ABNORMAL HIGH (ref 70–99)
Glucose-Capillary: 189 mg/dL — ABNORMAL HIGH (ref 70–99)
Glucose-Capillary: 53 mg/dL — ABNORMAL LOW (ref 70–99)
Glucose-Capillary: 61 mg/dL — ABNORMAL LOW (ref 70–99)

## 2022-01-05 MED ORDER — DEXTROSE 50 % IV SOLN
25.0000 g | INTRAVENOUS | Status: AC
  Administered 2022-01-05: 25 g via INTRAVENOUS

## 2022-01-05 MED ORDER — IOHEXOL 300 MG/ML  SOLN
50.0000 mL | Freq: Once | INTRAMUSCULAR | Status: AC | PRN
  Administered 2022-01-05: 15 mL

## 2022-01-05 MED ORDER — DEXTROSE 50 % IV SOLN: 12.5000 g | INTRAVENOUS | Status: DC

## 2022-01-05 MED ORDER — LIDOCAINE VISCOUS HCL 2 % MT SOLN
OROMUCOSAL | Status: AC
  Filled 2022-01-05: qty 15

## 2022-01-05 MED ORDER — GLUCAGON HCL RDNA (DIAGNOSTIC) 1 MG IJ SOLR
INTRAMUSCULAR | Status: AC
  Filled 2022-01-05: qty 1

## 2022-01-05 MED ORDER — FENTANYL CITRATE (PF) 100 MCG/2ML IJ SOLN
INTRAMUSCULAR | Status: AC
  Filled 2022-01-05: qty 2

## 2022-01-05 MED ORDER — GLUCAGON HCL (RDNA) 1 MG IJ SOLR
INTRAMUSCULAR | Status: AC | PRN
Start: 2022-01-05 — End: 2022-01-05
  Administered 2022-01-05: .5 mL via INTRAVENOUS

## 2022-01-05 MED ORDER — CEFAZOLIN SODIUM-DEXTROSE 2-4 GM/100ML-% IV SOLN
INTRAVENOUS | Status: AC
  Administered 2022-01-05: 2 g via INTRAVENOUS
  Filled 2022-01-05: qty 100

## 2022-01-05 MED ORDER — FENTANYL CITRATE (PF) 100 MCG/2ML IJ SOLN
INTRAMUSCULAR | Status: AC | PRN
  Administered 2022-01-05: 50 ug via INTRAVENOUS

## 2022-01-05 MED ORDER — LIDOCAINE HCL 1 % IJ SOLN
INTRAMUSCULAR | Status: AC
  Filled 2022-01-05: qty 20

## 2022-01-05 MED ORDER — DEXTROSE 10 % IV SOLN: INTRAVENOUS | Status: DC

## 2022-01-05 MED ORDER — CEFAZOLIN SODIUM-DEXTROSE 2-4 GM/100ML-% IV SOLN: INTRAVENOUS | Status: AC | PRN

## 2022-01-05 MED ORDER — KETOROLAC TROMETHAMINE 30 MG/ML IJ SOLN
30.0000 mg | Freq: Once | INTRAMUSCULAR | Status: AC
  Administered 2022-01-05: 30 mg via INTRAVENOUS
  Filled 2022-01-05: qty 1

## 2022-01-05 MED ORDER — MIDAZOLAM HCL 2 MG/2ML IJ SOLN
INTRAMUSCULAR | Status: AC
  Filled 2022-01-05: qty 2

## 2022-01-05 MED ORDER — MIDAZOLAM HCL 2 MG/2ML IJ SOLN
INTRAMUSCULAR | Status: AC | PRN
  Administered 2022-01-05: 1 mg via INTRAVENOUS

## 2022-01-05 MED ORDER — CEFAZOLIN SODIUM-DEXTROSE 2-4 GM/100ML-% IV SOLN
2.0000 g | INTRAVENOUS | Status: AC
  Filled 2022-01-05: qty 100

## 2022-01-05 MED ORDER — DEXTROSE-NACL 5-0.45 % IV SOLN: INTRAVENOUS | Status: DC

## 2022-01-05 MED ORDER — DEXTROSE 50 % IV SOLN
INTRAVENOUS | Status: AC
  Filled 2022-01-05: qty 50

## 2022-01-05 MED ORDER — LIDOCAINE HCL 1 % IJ SOLN
INTRAMUSCULAR | Status: AC | PRN
  Administered 2022-01-05: 10 mL via INTRADERMAL

## 2022-01-05 MED ORDER — DEXTROSE 50 % IV SOLN
12.5000 g | INTRAVENOUS | Status: AC
  Administered 2022-01-05: 12.5 g via INTRAVENOUS

## 2022-01-05 NOTE — Progress Notes (Signed)
[facility] PROGRESS NOTE   Kari Patel XMI:[id number] DOB: 08/18/1950 DOA: 01/01/2022  PCP: Pcp, No  Brief History/Interval Summary: 71 y.o. 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  Found to have small bowel obstruction.  Was hospitalized for further management.  Consultants: Medical oncology.  General surgery.  Palliative care.  Procedures: NG tube placement    Subjective/Interval History: Patient denies any abdominal pain this morning.  No nausea or vomiting.  Son is at the bedside.    Assessment/Plan:  Small bowel obstruction in the setting of colon cancer Obstruction seems to be caused by the cancerous process.  Patient seen by general surgery.  No surgical options are available at this time.   Patient currently has NG tube. Palliative care is following.  Medical oncology is following. Plan is for palliative gastrostomy tube placement by interventional radiology.  Hopefully this will be done today.  Metastatic colon cancer Progression of disease noted on CT scan.   Per oncology note does not appear that patient is a candidate for any further chemotherapy.  She appears to be a hospice candidate. Palliative care following for goals of care.  Essential hypertension Noted to be on amlodipine and ACE inhibitor prior to admission.  Due to NG tube with suctioning she is currently not on any oral agents.  Blood pressure reasonably well controlled with occasional high readings.  Use parenteral agents as needed for significantly elevated readings.     Hyponatremia Likely due to hypovolemia.  Improved on labs from yesterday.  Hypoglycemia Likely due to n.p.o. status.  We will change IV fluids to D5.  Normocytic anemia No evidence of overt bleeding.  Drop in hemoglobin is likely dilutional.   DVT Prophylaxis: SCDs Code Status: Full code Family  Communication: Discussed with patient and her son Disposition Plan: To be determined.  Mobilize.  Status is: Inpatient Remains inpatient appropriate because: Small bowel obstruction requiring NG tube      Medications: Scheduled:  Chlorhexidine Gluconate Cloth  6 each Topical Daily   dextrose       pantoprazole (PROTONIX) IV  40 mg Intravenous Daily   Continuous:   ceFAZolin (ANCEF) IV     dextrose 5 % and 0.45% NaCl 75 mL/hr at 01/05/22 8250   [id number], ondansetron (ZOFRAN) IV, phenol  Antibiotics: Anti-infectives (From admission, onward)    Start     Dose/Rate Route Frequency Ordered Stop   01/04/22 1500  ceFAZolin (ANCEF) IVPB 2g/100 mL premix        2 g 200 mL/hr over 30 Minutes Intravenous To Radiology 01/03/22 1344 01/05/22 1500       Objective:  Vital Signs  Vitals:   01/04/22 0548 01/04/22 1352 01/04/22 2029 01/05/22 0548  BP: (!) 150/58 (!) 147/81 (!) 153/80 (!) 147/66  Pulse: 74 82 88 72  Resp: '18  18 18  '$ Temp: 98.4 F (36.9 C) 98.3 F (36.8 C) 98.5 F (36.9 C) 97.7 F (36.5 C)  TempSrc: Oral Oral Oral Oral  SpO2: 100% 100% 98% 98%  Weight:      Height:        Intake/Output Summary (Last 24 hours) at 01/05/2022 0925 Last data filed at 01/04/2022 2212 Gross per 24 hour  Intake 658.46 ml  Output 525 ml  Net 133.46 ml    Filed Weights   01/01/22 2101  Weight: 54.6 kg    General appearance: Awake alert.  In no  distress Resp: Clear to auscultation bilaterally.  Normal effort Cardio: S1-S2 is normal regular.  No S3-S4.  No rubs murmurs or bruit GI: Abdomen is soft.  Nontender nondistended.  Sluggish bowel sounds. Extremities: No edema.  Moving all 4 extremities Neurologic: Alert and oriented x3.  No focal neurological deficits.      Lab Results:  Data Reviewed: I have personally reviewed following labs and reports of the imaging studies  CBC: Recent Labs  Lab 12/29/21 1420 01/01/22 1637 01/04/22 0855  WBC 3.3* 4.9 4.6   NEUTROABS 2.1 3.7  --   HGB 12.8 11.9* 10.5*  HCT 36.8 34.5* 31.1*  MCV 92.0 93.5 95.4  PLT 252 234 196     Basic Metabolic Panel: Recent Labs  Lab 12/29/21 1420 01/01/22 1637 01/02/22 0939 01/04/22 0855  NA 136 129* 133* 137  K 3.8 4.1 3.9 3.6  CL 96* 93* 100 102  CO2 '27 27 25 '$ 21*  GLUCOSE 94 120* 87 60*  BUN 9 10 7* <5*  CREATININE 0.60 0.61 0.45 0.53  CALCIUM 10.0 9.4 9.0 9.1     GFR: Estimated Creatinine Clearance: 55.6 mL/min (by C-G formula based on SCr of 0.53 mg/dL).  Liver Function Tests: Recent Labs  Lab 12/29/21 1420 01/01/22 1637  AST 12* 14*  ALT 8 10  ALKPHOS 64 62  BILITOT 0.9 0.5  PROT 8.5* 7.9  ALBUMIN 4.2 3.9     Recent Labs  Lab 01/01/22 1637  LIPASE 32      Radiology Studies: No results found.     LOS: 3 days   Fatimah Sundquist [employer] on [email or web address]  01/05/2022, 9:25 AM

## 2022-01-05 NOTE — Procedures (Signed)
Interventional Radiology Procedure Note  Procedure: FLUORO 20 FR PULL THRU GTUBE    Complications: None  Estimated Blood Loss:  MIN  Findings: FULL USE TOMORROW    M. Daryll Brod, MD

## 2022-01-05 NOTE — Progress Notes (Signed)
24 hour chart audit completed 

## 2022-01-05 NOTE — Telephone Encounter (Signed)
Oral Chemotherapy Pharmacist Encounter   Oral chemotherapy clinic will sign off at this time as patient is not proceeding with [facility].   Leron Croak, PharmD, BCPS Hematology/Oncology Clinical Pharmacist [facility] and [place] [phone] 01/05/2022 8:01 AM

## 2022-01-05 NOTE — Progress Notes (Signed)
Daily Progress Note   Patient Name: Kari Patel       Date: 01/05/2022 DOB: [birth year]  Age: 71 y.o. MRN#: [id number] Attending Physician: Bonnielee Haff, MD Primary Care Physician: Pcp, No Admit Date: 01/01/2022  Reason for Consultation/Follow-up: Establishing goals of care  Subjective: Patient is awake alert resting in bed, has NGT, did not rest well overnight, awaiting venting PEG placement today.   Length of Stay: 3  Current Medications: Scheduled Meds:   Chlorhexidine Gluconate Cloth  6 each Topical Daily   dextrose       pantoprazole (PROTONIX) IV  40 mg Intravenous Daily    Continuous Infusions:   ceFAZolin (ANCEF) IV     dextrose 5 % and 0.45% NaCl 75 mL/hr at 01/05/22 0822    PRN Meds: acetaminophen, ondansetron (ZOFRAN) IV, phenol  Physical Exam         Awake alert oriented No acute distress Minimal tenderness right lower quadrant Mild distention of abdomen Has NG tube Regular work of breathing Mood and affect within normal limits S1-S2  Vital Signs: BP (!) 147/66 (BP Location: Right Arm)   Pulse 72   Temp 97.7 F (36.5 C) (Oral)   Resp 18   Ht '5\' 4"'$  (1.626 m)   Wt 54.6 kg   SpO2 98%   BMI 20.66 kg/m  SpO2: SpO2: 98 % O2 Device: O2 Device: Room Air O2 Flow Rate:    Intake/output summary:  Intake/Output Summary (Last 24 hours) at 01/05/2022 1696 Last data filed at 01/04/2022 2212 Gross per 24 hour  Intake 658.46 ml  Output 525 ml  Net 133.46 ml    LBM: Last BM Date : 12/28/21 Baseline Weight: Weight: 54.6 kg Most recent weight: Weight: 54.6 kg       Palliative Assessment/Data:      Patient Active Problem List   Diagnosis Date Noted   Goals of care, counseling/discussion    General weakness    Small bowel obstruction ([place])  01/02/2022   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 01/02/2022   SBO (small bowel obstruction) from carcinomatosis 01/01/2022   Hyponatremia 01/01/2022   Enteritis 12/11/2021   HTN (hypertension) 12/11/2021   Malnutrition of moderate degree 12/11/2021   Partial small bowel obstruction ([place]) 12/10/2021   Port-A-Cath in place 06/07/2021   Iron deficiency anemia due to chronic blood  loss 03/06/2021   Cancer of right colon [facility]) 03/01/2021    Palliative Care Assessment & Plan   Patient Profile:    Assessment: Progressive metastatic colon cancer Recurrent small bowel obstruction   Recommendations/Plan: Full code full scope for now Venting PEG for palliative to be placed today Monitor hospital course, consider "comfort feeds" when venting gastrostomy tube is placed.  Ongoing [place] discussions with patient and family regarding consideration of DNR DNI and home with hospice.    Palliative medicine team to continue to follow along and help guide complex medical decision making.  Goals of Care and Additional Recommendations: Limitations on Scope of Treatment: Full Scope Treatment  Code Status:    Code Status Orders  (From admission, onward)           Start     Ordered   01/01/22 2333  Full code  Continuous        01/01/22 2332           Code Status History     Date Active Date Inactive Code Status Order ID Comments User Context   12/11/2021 0428 12/12/2021 2203 Full Code [id number]  Shela Leff, MD Inpatient   12/11/2021 0208 12/11/2021 0428 Full Code [id number]  Mansy, Arvella Merles, MD Inpatient      Advance Directive Documentation    Flowsheet Row Most Recent Value  Type of Advance Directive Healthcare Power of Attorney, Living will  Pre-existing out of facility DNR order (yellow form or pink MOST form) --  "MOST" Form in Place? --       Prognosis:  Unable to determine  Discharge Planning: To Be Determined  Care plan was discussed with  patient,    Thank  you for allowing the Palliative Medicine Team to assist in the care of this patient.   Time In: 9 Time Out: 9.25 Total Time 25 Prolonged Time Billed  no       Greater than 50%  of this time was spent counseling and coordinating care related to the above assessment and plan.  Loistine Chance, MD  Please contact Palliative Medicine Team phone at [phone] for questions and concerns.

## 2022-01-05 NOTE — Care Management Important Message (Signed)
Important Message  Patient Details IM Letter placed in Patients room. Name: Kari Patel MRN: [id number] Date of Birth: September 19, 1950   Medicare Important Message Given:  Yes     Kerin Salen 01/05/2022, 9:57 AM

## 2022-01-06 DIAGNOSIS — Z7189 Other specified counseling: Secondary | ICD-10-CM | POA: Diagnosis not present

## 2022-01-06 DIAGNOSIS — K56609 Unspecified intestinal obstruction, unspecified as to partial versus complete obstruction: Secondary | ICD-10-CM | POA: Diagnosis not present

## 2022-01-06 DIAGNOSIS — E162 Hypoglycemia, unspecified: Secondary | ICD-10-CM

## 2022-01-06 DIAGNOSIS — D649 Anemia, unspecified: Secondary | ICD-10-CM | POA: Diagnosis not present

## 2022-01-06 DIAGNOSIS — R531 Weakness: Secondary | ICD-10-CM | POA: Diagnosis not present

## 2022-01-06 DIAGNOSIS — I10 Essential (primary) hypertension: Secondary | ICD-10-CM | POA: Diagnosis not present

## 2022-01-06 DIAGNOSIS — C182 Malignant neoplasm of ascending colon: Secondary | ICD-10-CM | POA: Diagnosis not present

## 2022-01-06 LAB — BASIC METABOLIC PANEL
Anion gap: 7 (ref 5–15)
BUN: 8 mg/dL (ref 8–23)
CO2: 24 mmol/L (ref 22–32)
Calcium: 9.3 mg/dL (ref 8.9–10.3)
Chloride: 100 mmol/L (ref 98–111)
Creatinine, Ser: 0.44 mg/dL (ref 0.44–1.00)
GFR, Estimated: >60 mL/min (ref >60)
Glucose, Bld: 149 mg/dL — ABNORMAL HIGH (ref 70–99)
Potassium: 3.2 mmol/L — ABNORMAL LOW (ref 3.5–5.1)
Sodium: 131 mmol/L — ABNORMAL LOW (ref 135–145)

## 2022-01-06 LAB — CBC
HCT: 32 % — ABNORMAL LOW (ref 36.0–46.0)
Hemoglobin: 10.7 g/dL — ABNORMAL LOW (ref 12.0–15.0)
MCH: 31 pg (ref 26.0–34.0)
MCHC: 33.4 g/dL (ref 30.0–36.0)
MCV: 92.8 fL (ref 80.0–100.0)
Platelets: 232 10*3/uL (ref 150–400)
RBC: 3.45 MIL/uL — ABNORMAL LOW (ref 3.87–5.11)
RDW: 13.2 % (ref 11.5–15.5)
WBC: 3.7 10*3/uL — ABNORMAL LOW (ref 4.0–10.5)
nRBC: 0 % (ref 0.0–0.2)

## 2022-01-06 LAB — GLUCOSE, CAPILLARY
Glucose-Capillary: 120 mg/dL — ABNORMAL HIGH (ref 70–99)
Glucose-Capillary: 124 mg/dL — ABNORMAL HIGH (ref 70–99)
Glucose-Capillary: 142 mg/dL — ABNORMAL HIGH (ref 70–99)
Glucose-Capillary: 161 mg/dL — ABNORMAL HIGH (ref 70–99)

## 2022-01-06 MED ORDER — ALUM & MAG HYDROXIDE-SIMETH 200-200-20 MG/5ML PO SUSP
30.0000 mL | ORAL | Status: DC | PRN
  Administered 2022-01-06: 30 mL via ORAL
  Filled 2022-01-06: qty 30

## 2022-01-06 MED ORDER — DIPHENHYDRAMINE HCL 50 MG/ML IJ SOLN
25.0000 mg | Freq: Once | INTRAMUSCULAR | Status: AC
  Administered 2022-01-06: 25 mg via INTRAVENOUS
  Filled 2022-01-06: qty 1

## 2022-01-06 MED ORDER — POTASSIUM CHLORIDE 10 MEQ/100ML IV SOLN
10.0000 meq | INTRAVENOUS | Status: AC
  Administered 2022-01-06 (×4): 10 meq via INTRAVENOUS
  Filled 2022-01-06 (×2): qty 100

## 2022-01-06 NOTE — Progress Notes (Signed)
Daily Progress Note   Patient Name: Kari Patel       Date: 01/06/2022 DOB: [birth year]  Age: 71 y.o. MRN#: [id number] Attending Physician: Bonnielee Haff, MD Primary Care Physician: Pcp, No Admit Date: 01/01/2022  Reason for Consultation/Follow-up: Establishing goals of care  Subjective: Patient is awake alert resting in bed, gastrostomy tube placement by interventional radiology on 5/26, took some clear liquids so far. Son at bedside.     Length of Stay: 4  Current Medications: Scheduled Meds:   Chlorhexidine Gluconate Cloth  6 each Topical Daily   pantoprazole (PROTONIX) IV  40 mg Intravenous Daily    Continuous Infusions:  dextrose 50 mL/hr at 01/06/22 0531   potassium chloride 10 mEq (01/06/22 1124)    PRN Meds: acetaminophen, ondansetron (ZOFRAN) IV, phenol  Physical Exam         Awake alert oriented No acute distress Minimal tenderness right lower quadrant Mild distention of abdomen  NG tube D/C Regular work of breathing Mood and affect within normal limits S1-S2  Vital Signs: BP 134/66 (BP Location: Left Arm)   Pulse 84   Temp 99.3 F (37.4 C) (Oral)   Resp 18   Ht '5\' 4"'$  (1.626 m)   Wt 54.6 kg   SpO2 97%   BMI 20.66 kg/m  SpO2: SpO2: 97 % O2 Device: O2 Device: Room Air O2 Flow Rate: O2 Flow Rate (L/min): 2 L/min  Intake/output summary:  Intake/Output Summary (Last 24 hours) at 01/06/2022 1228 Last data filed at 01/06/2022 5176 Gross per 24 hour  Intake 1163.41 ml  Output 500 ml  Net 663.41 ml    LBM: Last BM Date : 12/28/21 Baseline Weight: Weight: 54.6 kg Most recent weight: Weight: 54.6 kg       Palliative Assessment/Data:      Patient Active Problem List   Diagnosis Date Noted   Goals of care, counseling/discussion    General  weakness    Small bowel obstruction ([place]) 01/02/2022   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 01/02/2022   SBO (small bowel obstruction) from carcinomatosis 01/01/2022   Hyponatremia 01/01/2022   Enteritis 12/11/2021   HTN (hypertension) 12/11/2021   Malnutrition of moderate degree 12/11/2021   Partial small bowel obstruction ([place]) 12/10/2021   Port-A-Cath in place 06/07/2021   Iron deficiency anemia due to chronic blood loss  03/06/2021   Cancer of right colon ([place]) 03/01/2021    Palliative Care Assessment & Plan   Patient Profile:    Assessment: Progressive metastatic colon cancer Recurrent small bowel obstruction   Recommendations/Plan: Full code full scope for now  Gastrostomy tube placement by interventional radiology on 5/26  Monitor hospital course, now on clear liquids as a trial of "comfort feeds" since venting gastrostomy tube has been placed.  Ongoing [place] discussions with patient and family regarding consideration of DNR DNI and home with hospice. Briefly re visited this again with both patient and son Ed at bedside today.    Palliative medicine team to continue to follow along and help guide complex medical decision making.  Goals of Care and Additional Recommendations: Limitations on Scope of Treatment: Full Scope Treatment  Code Status:    Code Status Orders  (From admission, onward)           Start     Ordered   01/01/22 2333  Full code  Continuous        01/01/22 2332           Code Status History     Date Active Date Inactive Code Status Order ID Comments User Context   12/11/2021 0428 12/12/2021 2203 Full Code [id number]  Shela Leff, MD Inpatient   12/11/2021 0208 12/11/2021 0428 Full Code [id number]  Mansy, Arvella Merles, MD Inpatient      Advance Directive Documentation    Flowsheet Row Most Recent Value  Type of Advance Directive Healthcare Power of Attorney, Living will  Pre-existing out of facility DNR order (yellow form or pink MOST  form) --  "MOST" Form in Place? --       Prognosis:  Unable to determine  Discharge Planning: To Be Determined  Care plan was discussed with  patient and son.   Thank you for allowing the Palliative Medicine Team to assist in the care of this patient.   Time In: 9 Time Out: 9.25 Total Time 25 Prolonged Time Billed  no       Greater than 50%  of this time was spent counseling and coordinating care related to the above assessment and plan.  Loistine Chance, MD  Please contact Palliative Medicine Team phone at [phone] for questions and concerns.

## 2022-01-06 NOTE — Progress Notes (Signed)
Referring Physician(s): * No referring provider recorded for this case *  Supervising Physician: Mir, Sharen Heck  Patient Status:  [facility] - In-pt  Chief Complaint: Metastatic colon cancer  Subjective: Patient s/p venting gastrostomy tube placement yesterday.  Site intact today.  Some bloody gastric drainage in tube. Flushes easily.  Tolerating clear liquids today  Allergies: Demerol [meperidine hcl]  Medications: Prior to Admission medications   Medication Sig Start Date End Date Taking? Authorizing Provider  amLODipine (NORVASC) 10 MG tablet Take 1 tablet (10 mg total) by mouth daily. 11/24/21  Yes [name], MD  DULoxetine (CYMBALTA) 20 MG capsule Take 1 capsule (20 mg total) by mouth daily. 12/13/21  Yes Memon, Jolaine Artist, MD  KLOR-CON M20 20 MEQ tablet TAKE 1 TABLET BY MOUTH EVERY DAY Patient taking differently: Take 20 mEq by mouth daily as needed (only after having blood work done). 11/13/21  Yes [name], MD  lactulose (CHRONULAC) 10 GM/15ML solution Take 30 mLs (20 g total) by mouth every 4 (four) hours as needed for severe constipation. Reduce dose or stop when you have diarrhea 12/29/21  Yes [name], MD  lidocaine-prilocaine (EMLA) cream Apply 1 application. topically as needed. Patient taking differently: Apply 1 application. topically as needed (for port access). 10/18/21  Yes [name], MD  lisinopril (ZESTRIL) 5 MG tablet TAKE 1 TABLET (5 MG TOTAL) BY MOUTH DAILY. Patient taking differently: Take 5 mg by mouth daily. 09/04/21  Yes [name], MD  LORazepam (ATIVAN) 0.5 MG tablet Take 1 tablet (0.5 mg total) by mouth every 8 (eight) hours as needed (Anxiety, Cancer Chemotherapy-Induced Nausea and Vomiting). 12/14/21  Yes [name], MD  ondansetron (ZOFRAN) 8 MG tablet Take 1 tablet (8 mg total) by mouth every 6 (six) hours as needed for nausea or vomiting. Patient not taking: Reported on 01/01/2022 12/12/21   Kathie Dike, MD  pantoprazole (PROTONIX) 40 MG tablet TAKE 1 TABLET BY  MOUTH EVERY DAY 01/01/22   [name], MD  sucralfate (CARAFATE) 1 g tablet TAKE 1 TABLET BY MOUTH 4 TIMES DAILY - WITH MEALS AND AT BEDTIME. 01/05/22   [name], MD  trifluridine-tipiracil (LONSURF) 20-8.19 MG tablet Take 3 tablets (60 mg of trifluridine total) by mouth 2 (two) times daily after a meal. 1 hr after AM & PM meals on days 1-5, 8-12. Repeat every 28day 12/27/21   [name], MD     Vital Signs: BP (!) 144/78 (BP Location: Left Arm)   Pulse 83   Temp 99.3 F (37.4 C) (Oral)   Resp 18   Ht '5\' 4"'$  (1.626 m)   Wt 120 lb 5.9 oz (54.6 kg)   SpO2 100%   BMI 20.66 kg/m   Physical Exam NAD, alert ABdomen: G-tube intact.  Clamped. Bloody gastric contents in tubing.  Flushes easily without pain.   Imaging: IR GASTROSTOMY TUBE MOD SED  Result Date: 01/05/2022 INDICATION: Gastric outlet obstruction related to small-bowel obstruction and peritoneal carcinomatosis EXAM: FLUOROSCOPIC 20 FRENCH PULL-THROUGH GASTROSTOMY Date:  01/05/2022 01/05/2022 4:47 pm Radiologist:  Jerilynn Mages. Daryll Brod, MD Guidance:  Kerby Less MEDICATIONS: 2 G ANCEF; Antibiotics were administered within 1 hour of the procedure. GLUCAGON 0.5 MG IV ANESTHESIA/SEDATION: Versed 1.0 mg IV; Fentanyl 50 mcg IV Moderate Sedation Time:  16 minutes The patient was continuously monitored during the procedure by the interventional radiology nurse under my direct supervision. CONTRAST:  32m OMNIPAQUE IOHEXOL 300 MG/ML SOLN - administered into the gastric lumen. FLUOROSCOPY: Fluoroscopy Time: 1 minutes 42 seconds (3.0  mGy). COMPLICATIONS: None immediate. PROCEDURE: Informed consent was obtained from the patient following explanation of the procedure, risks, benefits and alternatives. The patient understands, agrees and consents for the procedure. All questions were addressed. A time out was performed. Maximal barrier sterile technique utilized including caps, mask, sterile gowns, sterile gloves, large sterile drape, hand hygiene, and betadine  prep. The left upper quadrant was sterilely prepped and draped. An oral gastric catheter was inserted into the stomach under fluoroscopy. The existing nasogastric feeding tube was removed. Air was injected into the stomach for insufflation and visualization under fluoroscopy. The air distended stomach was confirmed beneath the anterior abdominal wall in the frontal and lateral projections. Under sterile conditions and local anesthesia, a 23 gauge trocar needle was utilized to access the stomach percutaneously beneath the left subcostal margin. Needle position was confirmed within the stomach under biplane fluoroscopy. Contrast injection confirmed position also. A single T tack was deployed for gastropexy. Over an Amplatz guide wire, a 9-French sheath was inserted into the stomach. A snare device was utilized to capture the oral gastric catheter. The snare device was pulled retrograde from the stomach up the esophagus and out the oropharynx. The 20-French pull-through gastrostomy was connected to the snare device and pulled antegrade through the oropharynx down the esophagus into the stomach and then through the percutaneous tract external to the patient. The gastrostomy was assembled externally. Contrast injection confirms position in the stomach. Images were obtained for documentation. The patient tolerated procedure well. No immediate complication. IMPRESSION: Fluoroscopic insertion of a 20-French "pull-through" gastrostomy. Electronically Signed   By: Jerilynn Mages.  Shick M.D.   On: 01/05/2022 16:56    Labs:  CBC: Recent Labs    12/29/21 1420 01/01/22 1637 01/04/22 0855 01/06/22 0939  WBC 3.3* 4.9 4.6 3.7*  HGB 12.8 11.9* 10.5* 10.7*  HCT 36.8 34.5* 31.1* 32.0*  PLT 252 234 196 232    COAGS: Recent Labs    03/13/21 0952 01/04/22 0855  INR 1.2 1.3*    BMP: Recent Labs    01/01/22 1637 01/02/22 0939 01/04/22 0855 01/06/22 0939  NA 129* 133* 137 131*  K 4.1 3.9 3.6 3.2*  CL 93* 100 102 100   CO2 27 25 21* 24  GLUCOSE 120* 87 60* 149*  BUN 10 7* <5* 8  CALCIUM 9.4 9.0 9.1 9.3  CREATININE 0.61 0.45 0.53 0.44  GFRNONAA >60 >60 >60 >60    LIVER FUNCTION TESTS: Recent Labs    11/24/21 1034 12/10/21 1830 12/29/21 1420 01/01/22 1637  BILITOT 0.4 0.9 0.9 0.5  AST 12* 17 12* 14*  ALT '6 11 8 10  '$ ALKPHOS 68 71 64 62  PROT 7.2 8.5* 8.5* 7.9  ALBUMIN 3.9 4.5 4.2 3.9    Assessment and Plan: Metastatic colon cancer, obstructive s/p venting gastrostomy tube placement 5/26 Patient with dark, bloody gastric drainage.  Dressing with old blood as well.  Site intact.  Dressing replaced.  Tube flushes easily.  Tolerating clear liquids today.   Electronically Signed: Docia Barrier, PA 01/06/2022, 3:29 PM   I spent a total of 15 Minutes at the the patient's bedside AND on the patient's hospital floor or unit, greater than 50% of which was counseling/coordinating care for metastatic colon cancer.

## 2022-01-06 NOTE — Progress Notes (Signed)
[facility] PROGRESS NOTE   Kari Patel VOZ:[id number] DOB: 1951-01-18 DOA: 01/01/2022  PCP: Pcp, No  Brief History/Interval Summary: 71 y.o. 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  Found to have small bowel obstruction.  Was hospitalized for further management.  Consultants: Medical oncology.  General surgery.  Palliative care.  Procedures: Gastrostomy tube placement by interventional radiology on 5/26    Subjective/Interval History: Son is at the bedside.  Patient mentions that she is feeling well.  Denies any abdominal pain this morning.  Tolerated placement of gastrostomy tube yesterday without difficulty.  The NG tube was pulled out.  Assessment/Plan:  Small bowel obstruction in the setting of colon cancer Obstruction seems to be caused by the cancerous process.  Patient seen by general surgery.  No surgical options are available at this time.   NGT was placed.  Patient was seen by medical oncology as well as palliative care.  Gastrostomy tube was placed for palliative purposes on 5/26.  NG tube was removed at that time.  Could try clear liquid diet today.  Could clamp gastrostomy tube and see how she does.   Patient and her son were informed that the reason for her obstruction is the cancer which is only going to progress.  Metastatic colon cancer Progression of disease noted on CT scan.   Per oncology note does not appear that patient is a candidate for any further chemotherapy.  She appears to be a hospice candidate. Palliative care has met with patient and family at least twice.  Patient and family still considering their options.  Hypoglycemia Likely due to n.p.o. status.  She was started on D5 infusion without any improvement.  Had to be switched over to D10 infusion.  Improvement in CBGs noted.  We will see how she does with the comfort  feeds.  Essential hypertension Noted to be on amlodipine and ACE inhibitor prior to admission.  Due to bowel obstruction tube with suctioning she is currently not on any oral agents.  Occasional high readings noted.  Continue to monitor for now.  Using parenteral agents as needed for significantly elevated readings.   Hyponatremia Likely due to hypovolemia.  Labs are pending from today.  Normocytic anemia Some bleeding noted in the output from the gastrostomy tube.  Hemoglobin from this morning is pending.   DVT Prophylaxis: SCDs Code Status: Full code Family Communication: Discussed with patient and her son Disposition Plan: To be determined.  Mobilize.  Status is: Inpatient Remains inpatient appropriate because: Small bowel obstruction requiring NG tube      Medications: Scheduled:  Chlorhexidine Gluconate Cloth  6 each Topical Daily   pantoprazole (PROTONIX) IV  40 mg Intravenous Daily   Continuous:  dextrose 50 mL/hr at 01/06/22 0531   QQV:[id number], ondansetron (ZOFRAN) IV, phenol  Antibiotics: Anti-infectives (From admission, onward)    Start     Dose/Rate Route Frequency Ordered Stop   01/05/22 1630  ceFAZolin (ANCEF) IVPB 2g/100 mL premix        over 30 Minutes Intravenous Continuous PRN 01/05/22 1631 01/05/22 1630   01/05/22 1230  ceFAZolin (ANCEF) IVPB 2g/100 mL premix        2 g 200 mL/hr over 30 Minutes Intravenous To Radiology 01/05/22 1130 01/05/22 1700   01/04/22 1500  ceFAZolin (ANCEF) IVPB 2g/100 mL premix  Status:  Discontinued        2 g 200 mL/hr over  30 Minutes Intravenous To Radiology 01/03/22 1344 01/05/22 1130       Objective:  Vital Signs  Vitals:   01/05/22 1640 01/05/22 1645 01/05/22 1930 01/06/22 0614  BP: (!) 188/77 (!) 152/75 (!) 147/79 134/66  Pulse: 89 75 89 84  Resp: $Remo'16 14 17 18  'LHwTP$ Temp:   98.2 F (36.8 C) 99.3 F (37.4 C)  TempSrc:   Oral Oral  SpO2: 100% 99% 97% 97%  Weight:      Height:        Intake/Output  Summary (Last 24 hours) at 01/06/2022 0914 Last data filed at 01/06/2022 0651 Gross per 24 hour  Intake 1163.41 ml  Output 500 ml  Net 663.41 ml    Filed Weights   01/01/22 2101  Weight: 54.6 kg    General appearance: Awake alert.  In no distress Resp: Clear to auscultation bilaterally.  Normal effort Cardio: S1-S2 is normal regular.  No S3-S4.  No rubs murmurs or bruit GI: Abdomen is soft.  Tender around PEG tube site.  Some blood noted in the PEG tube. Extremities: No edema.  Full range of motion of lower extremities. Neurologic: Alert and oriented x3.  No focal neurological deficits.     Lab Results:  Data Reviewed: I have personally reviewed following labs and reports of the imaging studies  CBC: Recent Labs  Lab 01/01/22 1637 01/04/22 0855  WBC 4.9 4.6  NEUTROABS 3.7  --   HGB 11.9* 10.5*  HCT 34.5* 31.1*  MCV 93.5 95.4  PLT 234 196     Basic Metabolic Panel: Recent Labs  Lab 01/01/22 1637 01/02/22 0939 01/04/22 0855  NA 129* 133* 137  K 4.1 3.9 3.6  CL 93* 100 102  CO2 27 25 21*  GLUCOSE 120* 87 60*  BUN 10 7* <5*  CREATININE 0.61 0.45 0.53  CALCIUM 9.4 9.0 9.1     GFR: Estimated Creatinine Clearance: 55.6 mL/min (by C-G formula based on SCr of 0.53 mg/dL).  Liver Function Tests: Recent Labs  Lab 01/01/22 1637  AST 14*  ALT 10  ALKPHOS 62  BILITOT 0.5  PROT 7.9  ALBUMIN 3.9     Recent Labs  Lab 01/01/22 1637  LIPASE 32      Radiology Studies: IR GASTROSTOMY TUBE MOD SED  Result Date: 01/05/2022 INDICATION: Gastric outlet obstruction related to small-bowel obstruction and peritoneal carcinomatosis EXAM: FLUOROSCOPIC 20 FRENCH PULL-THROUGH GASTROSTOMY Date:  01/05/2022 01/05/2022 4:47 pm Radiologist:  Jerilynn Mages. Daryll Brod, MD Guidance:  Kerby Less MEDICATIONS: 2 G ANCEF; Antibiotics were administered within 1 hour of the procedure. GLUCAGON 0.5 MG IV ANESTHESIA/SEDATION: Versed 1.0 mg IV; Fentanyl 50 mcg IV Moderate Sedation Time:  16  minutes The patient was continuously monitored during the procedure by the interventional radiology nurse under my direct supervision. CONTRAST:  67mL OMNIPAQUE IOHEXOL 300 MG/ML SOLN - administered into the gastric lumen. FLUOROSCOPY: Fluoroscopy Time: 1 minutes 42 seconds (3.0 mGy). COMPLICATIONS: None immediate. PROCEDURE: Informed consent was obtained from the patient following explanation of the procedure, risks, benefits and alternatives. The patient understands, agrees and consents for the procedure. All questions were addressed. A time out was performed. Maximal barrier sterile technique utilized including caps, mask, sterile gowns, sterile gloves, large sterile drape, hand hygiene, and betadine prep. The left upper quadrant was sterilely prepped and draped. An oral gastric catheter was inserted into the stomach under fluoroscopy. The existing nasogastric feeding tube was removed. Air was injected into the stomach for insufflation and visualization under fluoroscopy. The  air distended stomach was confirmed beneath the anterior abdominal wall in the frontal and lateral projections. Under sterile conditions and local anesthesia, a 61 gauge trocar needle was utilized to access the stomach percutaneously beneath the left subcostal margin. Needle position was confirmed within the stomach under biplane fluoroscopy. Contrast injection confirmed position also. A single T tack was deployed for gastropexy. Over an Amplatz guide wire, a 9-French sheath was inserted into the stomach. A snare device was utilized to capture the oral gastric catheter. The snare device was pulled retrograde from the stomach up the esophagus and out the oropharynx. The 20-French pull-through gastrostomy was connected to the snare device and pulled antegrade through the oropharynx down the esophagus into the stomach and then through the percutaneous tract external to the patient. The gastrostomy was assembled externally. Contrast injection  confirms position in the stomach. Images were obtained for documentation. The patient tolerated procedure well. No immediate complication. IMPRESSION: Fluoroscopic insertion of a 20-French "pull-through" gastrostomy. Electronically Signed   By: Jerilynn Mages.  Shick M.D.   On: 01/05/2022 16:56       LOS: 4 days   [facility] Pager on [email or web address]  01/06/2022, 9:14 AM

## 2022-01-07 DIAGNOSIS — C182 Malignant neoplasm of ascending colon: Secondary | ICD-10-CM | POA: Diagnosis not present

## 2022-01-07 DIAGNOSIS — K56609 Unspecified intestinal obstruction, unspecified as to partial versus complete obstruction: Secondary | ICD-10-CM | POA: Diagnosis not present

## 2022-01-07 DIAGNOSIS — I10 Essential (primary) hypertension: Secondary | ICD-10-CM | POA: Diagnosis not present

## 2022-01-07 DIAGNOSIS — D649 Anemia, unspecified: Secondary | ICD-10-CM | POA: Diagnosis not present

## 2022-01-07 LAB — CBC
HCT: 33.3 % — ABNORMAL LOW (ref 36.0–46.0)
Hemoglobin: 11.4 g/dL — ABNORMAL LOW (ref 12.0–15.0)
MCH: 31.8 pg (ref 26.0–34.0)
MCHC: 34.2 g/dL (ref 30.0–36.0)
MCV: 92.8 fL (ref 80.0–100.0)
Platelets: 290 10*3/uL (ref 150–400)
RBC: 3.59 MIL/uL — ABNORMAL LOW (ref 3.87–5.11)
RDW: 13.2 % (ref 11.5–15.5)
WBC: 3.2 10*3/uL — ABNORMAL LOW (ref 4.0–10.5)
nRBC: 0 % (ref 0.0–0.2)

## 2022-01-07 LAB — BASIC METABOLIC PANEL
Anion gap: 9 (ref 5–15)
BUN: <5 mg/dL — ABNORMAL LOW (ref 8–23)
CO2: 24 mmol/L (ref 22–32)
Calcium: 9.3 mg/dL (ref 8.9–10.3)
Chloride: 98 mmol/L (ref 98–111)
Creatinine, Ser: 0.35 mg/dL — ABNORMAL LOW (ref 0.44–1.00)
GFR, Estimated: >60 mL/min (ref >60)
Glucose, Bld: 136 mg/dL — ABNORMAL HIGH (ref 70–99)
Potassium: 3.3 mmol/L — ABNORMAL LOW (ref 3.5–5.1)
Sodium: 131 mmol/L — ABNORMAL LOW (ref 135–145)

## 2022-01-07 LAB — GLUCOSE, CAPILLARY
Glucose-Capillary: 129 mg/dL — ABNORMAL HIGH (ref 70–99)
Glucose-Capillary: 112 mg/dL — ABNORMAL HIGH (ref 70–99)
Glucose-Capillary: 119 mg/dL — ABNORMAL HIGH (ref 70–99)

## 2022-01-07 LAB — MAGNESIUM: Magnesium: 1.3 mg/dL — ABNORMAL LOW (ref 1.7–2.4)

## 2022-01-07 MED ORDER — POTASSIUM CHLORIDE 10 MEQ/100ML IV SOLN
10.0000 meq | INTRAVENOUS | Status: AC
  Administered 2022-01-07 (×4): 10 meq via INTRAVENOUS
  Filled 2022-01-07 (×2): qty 100

## 2022-01-07 MED ORDER — MAGNESIUM SULFATE 4 GM/100ML IV SOLN
4.0000 g | Freq: Two times a day (BID) | INTRAVENOUS | Status: AC
  Administered 2022-01-07 (×2): 4 g via INTRAVENOUS
  Filled 2022-01-07 (×2): qty 100

## 2022-01-07 MED ORDER — AMLODIPINE BESYLATE 5 MG PO TABS
2.5000 mg | ORAL_TABLET | Freq: Every day | ORAL | Status: DC
  Administered 2022-01-07 – 2022-01-09 (×3): 2.5 mg
  Filled 2022-01-07 (×3): qty 1

## 2022-01-07 MED ORDER — LORAZEPAM 0.5 MG PO TABS
0.5000 mg | ORAL_TABLET | Freq: Three times a day (TID) | ORAL | Status: DC | PRN
  Administered 2022-01-08: 0.5 mg via ORAL
  Filled 2022-01-07: qty 1

## 2022-01-07 MED ORDER — POTASSIUM CHLORIDE CRYS ER 20 MEQ PO TBCR
40.0000 meq | EXTENDED_RELEASE_TABLET | Freq: Once | ORAL | Status: AC
  Administered 2022-01-07: 40 meq via ORAL
  Filled 2022-01-07: qty 2

## 2022-01-07 MED ORDER — MELATONIN 3 MG PO TABS
3.0000 mg | ORAL_TABLET | Freq: Every day | ORAL | Status: DC
Start: 2022-01-07 — End: 2022-01-09
  Administered 2022-01-07: 3 mg via ORAL
  Filled 2022-01-07 (×2): qty 1

## 2022-01-07 NOTE — Progress Notes (Signed)
[facility] PROGRESS NOTE   Kari Patel UXL:[id number] DOB: 1950-12-28 DOA: 01/01/2022  PCP: Pcp, No  Brief History/Interval Summary: 71 y.o. 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  Found to have small bowel obstruction.  Was hospitalized for further management.  Consultants: Medical oncology.  General surgery.  Palliative care.  Procedures: Gastrostomy tube placement by interventional radiology on 5/26    Subjective/Interval History: Patient mentioned that she is feeling well.  Denies any nausea vomiting.  No significant abdominal pain.  States that she did not have much of the clear liquid yesterday but will try to have more today.    Assessment/Plan:  Small bowel obstruction in the setting of colon cancer Obstruction seems to be caused by the cancerous process.  Patient seen by general surgery.  No surgical options are available at this time.   NGT was placed.  Patient was seen by medical oncology as well as palliative care.  Gastrostomy tube was placed for palliative purposes on 5/26.  NG tube was removed at that time.   Started on clear liquids.  Gastrostomy tube was clamped yesterday.  Discussed with nursing staff.  They will educate patient and son regarding use of gastrostomy tube for venting.  We will see how she does with clear liquids over the next 24 hours.    Metastatic colon cancer Progression of disease noted on CT scan.   Per oncology note does not appear that patient is a candidate for any further chemotherapy.  She appears to be a hospice candidate. Palliative care has met with the patient and family multiple times.  Patient and family still considering their options.  Hypoglycemia Likely due to n.p.o. status.  She was started on D5 infusion without any improvement.  Subsequently had to be switched over to D10 infusion.  CBGs appear to  be better.  We will decrease the rate to 10 mL/h today. We will see how she does with the comfort feeds.  Essential hypertension/hypokalemia Noted to be on amlodipine and ACE inhibitor prior to admission.  These medications were held due to small bowel obstruction.  We will start her back on amlodipine at low-dose.  We will replace potassium.  Check magnesium.  Hyponatremia Multifactorial including hypovolemia and malignancy.  Stable.  Normocytic anemia Some bleeding noted in the output from the gastrostomy tube.  Hemoglobin has been stable.   DVT Prophylaxis: SCDs Code Status: Full code Family Communication: Discussed with patient and her son Disposition Plan: To be determined.  Mobilize.  Status is: Inpatient Remains inpatient appropriate because: Small bowel obstruction requiring NG tube      Medications: Scheduled:  amLODipine  2.5 mg Per Tube Daily   Chlorhexidine Gluconate Cloth  6 each Topical Daily   melatonin  3 mg Oral QHS   pantoprazole (PROTONIX) IV  40 mg Intravenous Daily   Continuous:  dextrose 50 mL/hr at 01/07/22 0600   ZDG:[id number], alum & mag hydroxide-simeth, LORazepam, ondansetron (ZOFRAN) IV, phenol  Antibiotics: Anti-infectives (From admission, onward)    Start     Dose/Rate Route Frequency Ordered Stop   01/05/22 1630  ceFAZolin (ANCEF) IVPB 2g/100 mL premix        over 30 Minutes Intravenous Continuous PRN 01/05/22 1631 01/05/22 1630   01/05/22 1230  ceFAZolin (ANCEF) IVPB 2g/100 mL premix        2 g 200 mL/hr over 30 Minutes Intravenous To Radiology 01/05/22 1130  01/05/22 1700   01/04/22 1500  ceFAZolin (ANCEF) IVPB 2g/100 mL premix  Status:  Discontinued        2 g 200 mL/hr over 30 Minutes Intravenous To Radiology 01/03/22 1344 01/05/22 1130       Objective:  Vital Signs  Vitals:   01/06/22 0614 01/06/22 1320 01/06/22 2057 01/07/22 0530  BP: 134/66 (!) 144/78 (!) 156/84 (!) 149/78  Pulse: 84 83 87 83  Resp: _0 Temp:  99.3 F (37.4 C)  98.4 F (36.9 C) 98.8 F (37.1 C)  TempSrc: Oral  Oral Oral  SpO2: 97% 100% 100% 100%  Weight:      Height:        Intake/Output Summary (Last 24 hours) at 01/07/2022 0906 Last data filed at 01/07/2022 0200 Gross per 24 hour  Intake 755.5 ml  Output --  Net 755.5 ml    Filed Weights   01/01/22 2101  Weight: 54.6 kg    General appearance: Awake alert.  In no distress Resp: Clear to auscultation bilaterally.  Normal effort Cardio: S1-S2 is normal regular.  No S3-S4.  No rubs murmurs or bruit GI: Abdomen is soft.  Mildly tender around the PEG tube site.  Bowel sounds sluggish. Extremities: No edema.  Full range of motion of lower extremities. Neurologic: Alert and oriented x3.  No focal neurological deficits.     Lab Results:  Data Reviewed: I have personally reviewed following labs and reports of the imaging studies  CBC: Recent Labs  Lab 01/01/22 1637 01/04/22 0855 01/06/22 0939 01/07/22 0648  WBC 4.9 4.6 3.7* 3.2*  NEUTROABS 3.7  --   --   --   HGB 11.9* 10.5* 10.7* 11.4*  HCT 34.5* 31.1* 32.0* 33.3*  MCV 93.5 95.4 92.8 92.8  PLT 234 196 232 290     Basic Metabolic Panel: Recent Labs  Lab 01/01/22 1637 01/02/22 0939 01/04/22 0855 01/06/22 0939 01/07/22 0648  NA 129* 133* 137 131* 131*  K 4.1 3.9 3.6 3.2* 3.3*  CL 93* 100 102 100 98  CO2 27 25 21* 24 24  GLUCOSE 120* 87 60* 149* 136*  BUN 10 7* <5* 8 <5*  CREATININE 0.61 0.45 0.53 0.44 0.35*  CALCIUM 9.4 9.0 9.1 9.3 9.3     GFR: Estimated Creatinine Clearance: 55.6 mL/min (A) (by C-G formula based on SCr of 0.35 mg/dL (L)).  Liver Function Tests: Recent Labs  Lab 01/01/22 1637  AST 14*  ALT 10  ALKPHOS 62  BILITOT 0.5  PROT 7.9  ALBUMIN 3.9     Recent Labs  Lab 01/01/22 1637  LIPASE 32      Radiology Studies: IR GASTROSTOMY TUBE MOD SED  Result Date: 01/05/2022 INDICATION: Gastric outlet obstruction related to small-bowel obstruction and peritoneal  carcinomatosis EXAM: FLUOROSCOPIC 20 FRENCH PULL-THROUGH GASTROSTOMY Date:  01/05/2022 01/05/2022 4:47 pm Radiologist:  Jerilynn Mages. Daryll Brod, MD Guidance:  Kerby Less MEDICATIONS: 2 G ANCEF; Antibiotics were administered within 1 hour of the procedure. GLUCAGON 0.5 MG IV ANESTHESIA/SEDATION: Versed 1.0 mg IV; Fentanyl 50 mcg IV Moderate Sedation Time:  16 minutes The patient was continuously monitored during the procedure by the interventional radiology nurse under my direct supervision. CONTRAST:  69m OMNIPAQUE IOHEXOL 300 MG/ML SOLN - administered into the gastric lumen. FLUOROSCOPY: Fluoroscopy Time: 1 minutes 42 seconds (3.0 mGy). COMPLICATIONS: None immediate. PROCEDURE: Informed consent was obtained from the patient following explanation of the procedure, risks, benefits and alternatives. The patient understands, agrees and consents for  the procedure. All questions were addressed. A time out was performed. Maximal barrier sterile technique utilized including caps, mask, sterile gowns, sterile gloves, large sterile drape, hand hygiene, and betadine prep. The left upper quadrant was sterilely prepped and draped. An oral gastric catheter was inserted into the stomach under fluoroscopy. The existing nasogastric feeding tube was removed. Air was injected into the stomach for insufflation and visualization under fluoroscopy. The air distended stomach was confirmed beneath the anterior abdominal wall in the frontal and lateral projections. Under sterile conditions and local anesthesia, a 30 gauge trocar needle was utilized to access the stomach percutaneously beneath the left subcostal margin. Needle position was confirmed within the stomach under biplane fluoroscopy. Contrast injection confirmed position also. A single T tack was deployed for gastropexy. Over an Amplatz guide wire, a 9-French sheath was inserted into the stomach. A snare device was utilized to capture the oral gastric catheter. The snare device was  pulled retrograde from the stomach up the esophagus and out the oropharynx. The 20-French pull-through gastrostomy was connected to the snare device and pulled antegrade through the oropharynx down the esophagus into the stomach and then through the percutaneous tract external to the patient. The gastrostomy was assembled externally. Contrast injection confirms position in the stomach. Images were obtained for documentation. The patient tolerated procedure well. No immediate complication. IMPRESSION: Fluoroscopic insertion of a 20-French "pull-through" gastrostomy. Electronically Signed   By: Jerilynn Mages.  Shick M.D.   On: 01/05/2022 16:56       LOS: 5 days   [facility] Pager on [email or web address]  01/07/2022, 9:06 AM

## 2022-01-07 NOTE — Progress Notes (Signed)
Daily Progress Note   Patient Name: Kari Patel       Date: 01/07/2022 DOB: Dec 09, 1950  Age: 71 y.o. MRN#: [id number] Attending Physician: Kari Haff, MD Primary Care Physician: Pcp, No Admit Date: 01/01/2022  Reason for Consultation/Follow-up: Establishing goals of care  Subjective: Patient is awake alert resting in bed, gastrostomy tube placement by interventional radiology on 5/26, continues to be on clear liquids so far. Son at bedside.     Length of Stay: 5  Current Medications: Scheduled Meds:   amLODipine  2.5 mg Per Tube Daily   Chlorhexidine Gluconate Cloth  6 each Topical Daily   melatonin  3 mg Oral QHS   pantoprazole (PROTONIX) IV  40 mg Intravenous Daily    Continuous Infusions:  dextrose 10 mL/hr at 01/07/22 0959   magnesium sulfate bolus IVPB 4 g (01/07/22 1204)   potassium chloride 10 mEq (01/07/22 1105)    PRN Meds: acetaminophen, alum & mag hydroxide-simeth, LORazepam, ondansetron (ZOFRAN) IV, phenol  Physical Exam         Awake alert oriented No acute distress Minimal tenderness right lower quadrant Mild distention of abdomen  NG tube D/C Regular work of breathing Mood and affect within normal limits S1-S2  Vital Signs: BP (!) 149/78   Pulse 83   Temp 98.8 F (37.1 C) (Oral)   Resp 18   Ht '5\' 4"'$  (1.626 m)   Wt 54.6 kg   SpO2 100%   BMI 20.66 kg/m  SpO2: SpO2: 100 % O2 Device: O2 Device: Room Air O2 Flow Rate: O2 Flow Rate (L/min): 2 L/min  Intake/output summary:  Intake/Output Summary (Last 24 hours) at 01/07/2022 1341 Last data filed at 01/07/2022 0200 Gross per 24 hour  Intake 755.5 ml  Output --  Net 755.5 ml    LBM: Last BM Date : 12/28/21 Baseline Weight: Weight: 54.6 kg Most recent weight: Weight: 54.6 kg        Palliative Assessment/Data:      Patient Active Problem List   Diagnosis Date Noted   Goals of care, counseling/discussion    General weakness    Small bowel obstruction ([place]) 01/02/2022   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) 03/01/2021    Palliative Care Assessment & Plan   Patient Profile:    Assessment: Progressive metastatic colon cancer Recurrent small bowel obstruction   Recommendations/Plan: Full code full scope for now  Gastrostomy tube placement by interventional radiology on 5/26  Monitor hospital course, now on clear liquids as a trial of "comfort feeds" since venting gastrostomy tube has been placed.  Ongoing [place] discussions with patient and family regarding consideration of DNR DNI and home with hospice. Briefly re visited this again with both patient and son Kari Patel at bedside today.    Palliative medicine team to continue to follow along and help guide complex medical decision making. As per patient and family request, a copy of " hard choices" booklet provided to patient and son.  They will review.  They state they have not made any decisions with regards to CODE STATUS or hospice consideration yet.  Goals of Care and Additional Recommendations: Limitations on Scope of Treatment: Full Scope Treatment  Code Status:    Code Status Orders  (From admission, onward)           Start     Ordered   01/01/22 2333  Full code  Continuous        01/01/22 2332           Code Status History     Date Active Date Inactive Code Status Order ID Comments User Context   12/11/2021  0428 12/12/2021 2203 Full Code [id number]  Kari Leff, MD Inpatient   12/11/2021 0208 12/11/2021 0428 Full Code [id number]  Kari Patel, Kari Merles, MD Inpatient      Advance Directive Documentation    Flowsheet Row Most Recent Value  Type of Advance Directive Healthcare Power of Attorney, Living will  Pre-existing out of facility DNR order (yellow form or pink MOST form) --  "MOST" Form in Place? --       Prognosis:  Unable to determine  Discharge Planning: To Be Determined  Care plan was discussed with  patient and son.   Thank you for allowing the Palliative Medicine Team to assist in the care of this patient.   Time In: 1300 Time Out: 1325 Total Time 25 Prolonged Time Billed  no       Greater than 50%  of this time was spent counseling and coordinating care related to the above assessment and plan.  Kari Chance, MD  Please contact Palliative Medicine Team phone at [phone] for questions and concerns.

## 2022-01-08 DIAGNOSIS — K56609 Unspecified intestinal obstruction, unspecified as to partial versus complete obstruction: Secondary | ICD-10-CM | POA: Diagnosis not present

## 2022-01-08 DIAGNOSIS — C182 Malignant neoplasm of ascending colon: Secondary | ICD-10-CM | POA: Diagnosis not present

## 2022-01-08 DIAGNOSIS — Z515 Encounter for palliative care: Secondary | ICD-10-CM

## 2022-01-08 LAB — GLUCOSE, CAPILLARY
Glucose-Capillary: 108 mg/dL — ABNORMAL HIGH (ref 70–99)
Glucose-Capillary: 120 mg/dL — ABNORMAL HIGH (ref 70–99)

## 2022-01-08 LAB — CBC
HCT: 33.6 % — ABNORMAL LOW (ref 36.0–46.0)
Hemoglobin: 11.6 g/dL — ABNORMAL LOW (ref 12.0–15.0)
MCH: 32 pg (ref 26.0–34.0)
MCHC: 34.5 g/dL (ref 30.0–36.0)
MCV: 92.8 fL (ref 80.0–100.0)
Platelets: 249 10*3/uL (ref 150–400)
RBC: 3.62 MIL/uL — ABNORMAL LOW (ref 3.87–5.11)
RDW: 13.3 % (ref 11.5–15.5)
WBC: 3.4 10*3/uL — ABNORMAL LOW (ref 4.0–10.5)
nRBC: 0 % (ref 0.0–0.2)

## 2022-01-08 LAB — BASIC METABOLIC PANEL
Anion gap: 8 (ref 5–15)
BUN: 5 mg/dL — ABNORMAL LOW (ref 8–23)
CO2: 26 mmol/L (ref 22–32)
Calcium: 9.2 mg/dL (ref 8.9–10.3)
Chloride: 101 mmol/L (ref 98–111)
Creatinine, Ser: 0.41 mg/dL — ABNORMAL LOW (ref 0.44–1.00)
GFR, Estimated: >60 mL/min (ref >60)
Glucose, Bld: 120 mg/dL — ABNORMAL HIGH (ref 70–99)
Potassium: 4.2 mmol/L (ref 3.5–5.1)
Sodium: 135 mmol/L (ref 135–145)

## 2022-01-08 LAB — MAGNESIUM: Magnesium: 2.5 mg/dL — ABNORMAL HIGH (ref 1.7–2.4)

## 2022-01-08 MED ORDER — SENNOSIDES-DOCUSATE SODIUM 8.6-50 MG PO TABS
1.0000 | ORAL_TABLET | Freq: Two times a day (BID) | ORAL | Status: DC
  Administered 2022-01-09: 1 via ORAL
  Filled 2022-01-08 (×2): qty 1

## 2022-01-08 MED ORDER — AMLODIPINE BESYLATE 2.5 MG PO TABS: 2.5000 mg | ORAL_TABLET | Freq: Every day | ORAL | 2 refills | Status: AC

## 2022-01-08 MED ORDER — FLEET ENEMA 7-19 GM/118ML RE ENEM
1.0000 | ENEMA | Freq: Once | RECTAL | Status: AC | PRN
Start: 2022-01-08 — End: 2022-01-08
  Administered 2022-01-08: 1 via RECTAL
  Filled 2022-01-08: qty 1

## 2022-01-08 MED ORDER — HYDROCODONE-ACETAMINOPHEN 5-325 MG PO TABS: 1.0000 | ORAL_TABLET | Freq: Four times a day (QID) | ORAL | 0 refills | Status: AC | PRN

## 2022-01-08 MED ORDER — BISACODYL 10 MG RE SUPP
10.0000 mg | Freq: Once | RECTAL | Status: AC
  Administered 2022-01-08: 10 mg via RECTAL
  Filled 2022-01-08: qty 1

## 2022-01-08 NOTE — Progress Notes (Addendum)
Manufacturing engineer [facility]) Hospital Liaison Note  Referral received for patient/family interest in home with hospice. [facility] liaison spoke with patient's son ED to confirm interest. Interest confirmed.   Hospice eligibility confirmed   Plan is to discharge home tomorrow.   DME in the home: none  DME needs: hospital bed, BSC, shower chair, over the bed table and wheelchair  Please send patient home with comfort prescriptions/medications at discharge.   Please call with any questions or concerns. Thank you  Roselee Nova, [facility] Liaison [phone]

## 2022-01-08 NOTE — Progress Notes (Signed)
Educated family on process of venting g-tube, return demonstration by son

## 2022-01-08 NOTE — Progress Notes (Signed)
Daily Progress Note   Patient Name: Kari Patel       Date: 01/08/2022 DOB: [birth year]  Age: 71 y.o. MRN#: [id number] Attending Physician: Bonnielee Haff, MD Primary Care Physician: Pcp, No Admit Date: 01/01/2022  Reason for Consultation/Follow-up: Establishing goals of care  Subjective: Patient defers decision making to her son, remains on clear liquids.   Discussed with son outside the patient's room.       Length of Stay: 6  Current Medications: Scheduled Meds:   amLODipine  2.5 mg Per Tube Daily   Chlorhexidine Gluconate Cloth  6 each Topical Daily   melatonin  3 mg Oral QHS   pantoprazole (PROTONIX) IV  40 mg Intravenous Daily    Continuous Infusions:  dextrose 10 mL/hr at 01/07/22 0959    PRN Meds: acetaminophen, alum & mag hydroxide-simeth, LORazepam, ondansetron (ZOFRAN) IV, phenol  Physical Exam         Awake alert oriented No acute distress Minimal tenderness right lower quadrant Mild distention of abdomen  NG tube D/C Regular work of breathing Mood and affect within normal limits S1-S2  Vital Signs: BP 133/70 (BP Location: Left Arm)   Pulse 81   Temp 97.9 F (36.6 C) (Oral)   Resp 18   Ht '5\' 4"'$  (1.626 m)   Wt 54.6 kg   SpO2 100%   BMI 20.66 kg/m  SpO2: SpO2: 100 % O2 Device: O2 Device: Room Air O2 Flow Rate: O2 Flow Rate (L/min): 2 L/min  Intake/output summary:  Intake/Output Summary (Last 24 hours) at 01/08/2022 0855 Last data filed at 01/07/2022 1757 Gross per 24 hour  Intake --  Output 250 ml  Net -250 ml    LBM: Last BM Date : 12/28/21 Baseline Weight: Weight: 54.6 kg Most recent weight: Weight: 54.6 kg       Palliative Assessment/Data:      Patient Active Problem List   Diagnosis Date Noted   Goals of care,  counseling/discussion    General weakness    Small bowel obstruction ([place]) 01/02/2022   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 01/02/2022   SBO (small bowel obstruction) from carcinomatosis 01/01/2022   Hyponatremia 01/01/2022   Enteritis 12/11/2021   HTN (hypertension) 12/11/2021   Malnutrition of moderate degree 12/11/2021   Partial small bowel obstruction ([place]) 12/10/2021   Port-A-Cath in place  06/07/2021   Iron deficiency anemia due to chronic blood loss 03/06/2021   Cancer of right colon [facility]) 03/01/2021    Palliative Care Assessment & Plan   Patient Profile:    Assessment: Progressive metastatic colon cancer Recurrent small bowel obstruction   Recommendations/Plan: Full code full scope for now  Gastrostomy tube placement by interventional radiology on 5/26  Monitor hospital course, now on clear liquids as a trial of "comfort feeds" since venting gastrostomy tube has been placed.  Ongoing [place] discussions with patient and family regarding consideration of DNR DNI and home with hospice.   Son wishes to discuss further with hospice liaison about home hospice arrangements. They will consider DNR once patient is home. Discussed with son in detail about hospice philosophy of care, difference between home with hospice versus home with palliative.  TOC consulted.   Goals of Care and Additional Recommendations: Limitations on Scope of Treatment: Full Scope Treatment  Code Status:    Code Status Orders  (From admission, onward)           Start     Ordered   01/01/22 2333  Full code  Continuous        01/01/22 2332           Code Status History     Date Active Date Inactive Code Status Order ID Comments User Context   12/11/2021 0428 12/12/2021 2203 Full Code [id number]  Shela Leff, MD Inpatient   12/11/2021 0208 12/11/2021 0428 Full Code [id number]  Mansy, Arvella Merles, MD Inpatient      Advance Directive Documentation    Flowsheet Row Most Recent Value  Type  of Advance Directive Healthcare Power of Attorney, Living will  Pre-existing out of facility DNR order (yellow form or pink MOST form) --  "MOST" Form in Place? --       Prognosis:  Unable to determine  Discharge Planning: To Be Determined  Care plan was discussed with  IDT and son   Thank you for allowing the Palliative Medicine Team to assist in the care of this patient.   Time In: 8 Time Out: 8.25 Total Time 25 Prolonged Time Billed  no       Greater than 50%  of this time was spent counseling and coordinating care related to the above assessment and plan.  Loistine Chance, MD  Please contact Palliative Medicine Team phone at [phone] for questions and concerns.

## 2022-01-08 NOTE — Plan of Care (Signed)
  Problem: Activity: Goal: Risk for activity intolerance will decrease Outcome: Progressing   

## 2022-01-08 NOTE — TOC Transition Note (Signed)
Transition of Care [facility]) - CM/SW Discharge Note  Patient Details  Name: Kari Patel MRN: [id number] Date of Birth: 04-Jan-1951  [facility] CM/SW Contact:  Sherie Don, LCSW Phone Number: 01/08/2022, 12:34 PM  Clinical Narrative: [facility] consulted for home hospice referral. CSW spoke with patient's son, Kari Patel, to discuss referral. Son confirmed the plan is for the patient to discharge home with hospice to his home at [place], [place] [address]. Son agreeable to referral to [place]. CSW made hospice referral to [facility] with [facility]. Shanita notified CSW that family is declining any DME at this time, so patient can be discharged to her son's home. CSW updated treatment team. TOC signing off.  Final next level of care: Home w Hospice Care Barriers to Discharge: Barriers Resolved  Patient Goals and CMS Choice Patient states their goals for this hospitalization and ongoing recovery are:: Discharge home with hospice CMS Medicare.gov Compare Post Acute Care list provided to:: Patient Represenative (must comment) Choice offered to / list presented to : Adult Children  Discharge Plan and Services        DME Arranged: N/A DME Agency: NA  Readmission Risk Interventions     View : No data to display.

## 2022-01-08 NOTE — Discharge Summary (Signed)
[facility]  Physician Discharge Summary   Patient ID: Kari Patel MRN: [id number] DOB/AGE: 72-Jul-1952 72 y.o.  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  Found to have small bowel obstruction.  Was hospitalized for further management.   Consultants: Medical oncology.  General surgery.  Palliative care.   Procedures: Gastrostomy tube placement by interventional radiology on 5/26    HOSPITAL COURSE:   Small bowel obstruction in the setting of colon cancer Obstruction seems to be caused by the cancerous process.  Patient seen by general surgery.  No surgical options are available at this time.   NGT was placed.  Patient was seen by medical oncology as well as palliative care.  Gastrostomy tube was placed for palliative purposes on 5/26.  NG tube was removed at that time.   Started on clear liquids.  Gastrostomy tube was clamped.  Seems to be tolerating the clear liquids well.   She is passing gas but no bowel movements.   Patient and family understands what to expect in the setting of the small bowel obstruction caused by her colon cancer.   Nursing staff to educate the patient on how to use the  gastrostomy tube for venting purposes.     Metastatic colon cancer Progression of disease noted on CT scan.   Per oncology note does not appear that patient is a candidate for any further chemotherapy.  She appears to be a hospice candidate. Palliative care has met with the patient and family multiple times.  Patient and family agreeable to hospice.  Patient and family will decide on CODE STATUS at later date.   Hypoglycemia Likely due to n.p.o. status.  Required D10 infusion.  Now that she is tolerating clear liquids glucose levels have improved.     Essential hypertension/hypokalemia/hypomagnesemia Blood pressure is stable.  Electrolytes corrected.    Hyponatremia Multifactorial including hypovolemia and malignancy.  Stable.  Normocytic anemia Some bleeding noted in the output from the gastrostomy tube.  Hemoglobin has been stable.   To be ambulated today.  She is stable for discharge.  Hopefully discharge later today.   PERTINENT LABS:  The results of significant diagnostics from this hospitalization (including imaging, microbiology, ancillary and laboratory) are listed below for reference.      Labs:   Basic Metabolic Panel: Recent Labs  Lab 01/02/22 0939 01/04/22 0855 01/06/22 0939 01/07/22 0648 01/08/22 0430  NA 133* 137 131* 131* 135  K 3.9 3.6 3.2* 3.3* 4.2  CL 100 102 100 98 101  CO2 25 21* _0 GLUCOSE 87 60* 149* 136* 120*  BUN 7* <5* 8 <5* 5*  CREATININE 0.45 0.53 0.44 0.35* 0.41*  CALCIUM 9.0 9.1 9.3 9.3 9.2  MG  --   --   --  1.3* 2.5*   Liver Function Tests: Recent Labs  Lab 01/01/22 1637  AST 14*  ALT 10  ALKPHOS 62  BILITOT 0.5  PROT 7.9  ALBUMIN 3.9   Recent Labs  Lab 01/01/22 1637  LIPASE 32    CBC: Recent Labs  Lab 01/01/22 1637 01/04/22 0855 01/06/22 0939 01/07/22 0648 01/08/22 0430  WBC 4.9 4.6 3.7* 3.2* 3.4*  NEUTROABS 3.7  --   --   --   --   HGB 11.9* 10.5* 10.7* 11.4* 11.6*  HCT 34.5* 31.1* 32.0* 33.3* 33.6*   MCV 93.5 95.4 92.8 92.8 92.8  PLT 234 196 232 290 249     CBG: Recent Labs  Lab 01/07/22 0530 01/07/22 1223 01/07/22 1702 01/08/22 0030 01/08/22 0559  GLUCAP 129* 112* 119* 120* 108*     IMAGING STUDIES DG Abdomen 1 View  Result Date: 01/01/2022 CLINICAL DATA:  NG tube placement. EXAM: ABDOMEN - 1 VIEW COMPARISON:  CT examination dated Jan 01, 2022 FINDINGS: NG tube with distal tip projecting over the gastric body. Multiple dilated small bowel loops suggesting ileus or obstruction. No radio-opaque calculi or other significant radiographic abnormality are seen. IMPRESSION: 1. NG tube with distal tip projecting over the body of the stomach. 2. Multiple dilated small bowel loops suggesting ileus or obstruction. Electronically Signed   By: Keane Police D.O.   On: 01/01/2022 19:25   CT ABDOMEN PELVIS W CONTRAST  Result Date: 01/01/2022 CLINICAL DATA:  Abdominal pain, nausea, history of colon cancer EXAM: CT ABDOMEN AND PELVIS WITH CONTRAST TECHNIQUE: Multidetector CT imaging of the abdomen and pelvis was performed using the standard protocol following bolus administration of intravenous contrast. RADIATION DOSE REDUCTION: This exam was performed according to the departmental dose-optimization program which includes automated exposure control, adjustment of the mA and/or kV according to patient size and/or use of iterative reconstruction technique. CONTRAST:  74m OMNIPAQUE IOHEXOL 300 MG/ML  SOLN COMPARISON:  12/08/2021 FINDINGS: Lower chest: Bilateral lower lobe pulmonary nodules are again identified. 6 mm right lower lobe pulmonary nodule image 2/6, and 10 mm left lower lobe pulmonary nodule image 17/6, not appreciably changed since prior study. Findings are consistent with pulmonary metastases. No acute airspace disease, effusion, or pneumothorax. Hepatobiliary: Stable 10 mm hypodense nodule superior aspect right lobe liver image 10/2. Metastatic disease is not excluded. The remainder of the  liver is grossly unremarkable. Gallbladder is moderately distended, with minimal sludge or small noncalcified stones layering dependently within the gallbladder lumen. No gallbladder wall thickening or pericholecystic fluid. Pancreas: Unremarkable. No pancreatic ductal dilatation or surrounding inflammatory changes. Spleen: Normal in size without focal abnormality. Adrenals/Urinary Tract: Adrenal glands are unremarkable. Kidneys are normal, without renal calculi, focal lesion, or hydronephrosis. Bladder is unremarkable. Stomach/Bowel: There is an enlarging ill-defined soft tissue mass within the right lower quadrant at the level of the ileocecal valve, measuring up to 4.7 x 3.6 cm. This results in high-grade small bowel obstruction, with a proximal small bowel measuring up to 3.9 cm in diameter, with multiple gas fluid levels. Mild wall thickening of the distal ileum persists. Mild gas and stool throughout the colon to the level of the rectum. Diffuse distal colonic diverticulosis without evidence of diverticulitis. Vascular/Lymphatic: Aortic atherosclerosis. No discrete pathologic adenopathy within the abdomen or pelvis. Reproductive: Status post hysterectomy. No adnexal masses. Other: Aggressive nodularity within the ventral omentum in the lower abdomen and pelvis again noted, consistent with progressive omental metastatic disease. No free fluid or free intraperitoneal gas. There is a  small fat containing umbilical hernia, with omental nodularity contain within the umbilical hernia site. Musculoskeletal: No acute or destructive bony lesions. Reconstructed images demonstrate no additional findings. IMPRESSION: 1. Enlarging soft tissue mass within the right lower quadrant, centered at the level of the ileocecal valve, resulting in high-grade small bowel obstruction. Findings are consistent with progressive malignancy. 2. Progressive omental nodularity within the lower abdomen and pelvis consistent with worsening  metastatic disease. 3. Stable bilateral pulmonary nodules and right lobe hepatic hypodensity, consistent with metastatic disease. 4. Distal colonic diverticulosis without diverticulitis. 5.  Aortic Atherosclerosis (ICD10-I70.0). Electronically Signed   By: Randa Ngo M.D.   On: 01/01/2022 18:10   IR GASTROSTOMY TUBE MOD SED  Result Date: 01/05/2022 INDICATION: Gastric outlet obstruction related to small-bowel obstruction and peritoneal carcinomatosis EXAM: FLUOROSCOPIC 20 FRENCH PULL-THROUGH GASTROSTOMY Date:  01/05/2022 01/05/2022 4:47 pm Radiologist:  Jerilynn Mages. Daryll Brod, MD Guidance:  Kerby Less MEDICATIONS: 2 G ANCEF; Antibiotics were administered within 1 hour of the procedure. GLUCAGON 0.5 MG IV ANESTHESIA/SEDATION: Versed 1.0 mg IV; Fentanyl 50 mcg IV Moderate Sedation Time:  16 minutes The patient was continuously monitored during the procedure by the interventional radiology nurse under my direct supervision. CONTRAST:  58m OMNIPAQUE IOHEXOL 300 MG/ML SOLN - administered into the gastric lumen. FLUOROSCOPY: Fluoroscopy Time: 1 minutes 42 seconds (3.0 mGy). COMPLICATIONS: None immediate. PROCEDURE: Informed consent was obtained from the patient following explanation of the procedure, risks, benefits and alternatives. The patient understands, agrees and consents for the procedure. All questions were addressed. A time out was performed. Maximal barrier sterile technique utilized including caps, mask, sterile gowns, sterile gloves, large sterile drape, hand hygiene, and betadine prep. The left upper quadrant was sterilely prepped and draped. An oral gastric catheter was inserted into the stomach under fluoroscopy. The existing nasogastric feeding tube was removed. Air was injected into the stomach for insufflation and visualization under fluoroscopy. The air distended stomach was confirmed beneath the anterior abdominal wall in the frontal and lateral projections. Under sterile conditions and local  anesthesia, a 19gauge trocar needle was utilized to access the stomach percutaneously beneath the left subcostal margin. Needle position was confirmed within the stomach under biplane fluoroscopy. Contrast injection confirmed position also. A single T tack was deployed for gastropexy. Over an Amplatz guide wire, a 9-French sheath was inserted into the stomach. A snare device was utilized to capture the oral gastric catheter. The snare device was pulled retrograde from the stomach up the esophagus and out the oropharynx. The 20-French pull-through gastrostomy was connected to the snare device and pulled antegrade through the oropharynx down the esophagus into the stomach and then through the percutaneous tract external to the patient. The gastrostomy was assembled externally. Contrast injection confirms position in the stomach. Images were obtained for documentation. The patient tolerated procedure well. No immediate complication. IMPRESSION: Fluoroscopic insertion of a 20-French "pull-through" gastrostomy. Electronically Signed   By: MJerilynn Mages  Shick M.D.   On: 01/05/2022 16:56    DISCHARGE EXAMINATION: Vitals:   01/07/22 0530 01/07/22 1350 01/07/22 2151 01/08/22 0557  BP: (!) 149/78 138/79 137/80 133/70  Pulse: 83 88 82 81  Resp:  _0 Temp: 98.8 F (37.1 C) 98.3 F (36.8 C) 97.9 F (36.6 C) 97.9 F (36.6 C)  TempSrc: Oral Oral Oral Oral  SpO2: 100% 100% 100% 100%  Weight:      Height:       General appearance: Awake alert.  In no distress Resp: Clear to auscultation bilaterally.  Normal effort Cardio: S1-S2 is normal regular.  No S3-S4.  No rubs murmurs or bruit GI: Abdomen is soft.  Mildly tender.  Gastrostomy tube is noted.    DISPOSITION: Home with son  Discharge Instructions     Call MD for:  difficulty breathing, headache or visual disturbances   Complete by: As directed    Call MD for:  extreme fatigue   Complete by: As directed    Call MD for:  persistant dizziness or  light-headedness   Complete by: As directed    Call MD for:  persistant nausea and vomiting   Complete by: As directed    Call MD for:  severe uncontrolled pain   Complete by: As directed    Call MD for:  temperature >100.4   Complete by: As directed    Diet clear liquid   Complete by: As directed    Discharge instructions   Complete by: As directed    Please take your medications as prescribed.  Stay on liquid diet for now.  Stay on clear liquids for now and you may try broth in a few days.  If you feel bloated or if your abdomen feels distended or if you feel nauseated then the tube in your abdomen will need to be opened up to decompress your bowels.  Nurse will provide education on how to do this.    You were cared for by a hospitalist during your hospital stay. If you have any questions about your discharge medications or the care you received while you were in the hospital after you are discharged, you can call the unit and asked to speak with the hospitalist on call if the hospitalist that took care of you is not available. Once you are discharged, your primary care physician will handle any further medical issues. Please note that NO REFILLS for any discharge medications will be authorized once you are discharged, as it is imperative that you return to your primary care physician (or establish a relationship with a primary care physician if you do not have one) for your aftercare needs so that they can reassess your need for medications and monitor your lab values. If you do not have a primary care physician, you can call [phone] for a physician referral.   Increase activity slowly   Complete by: As directed          Allergies as of 01/08/2022       Reactions   Demerol [meperidine Hcl] Nausea Only        Medication List     STOP taking these medications    lisinopril 5 MG tablet Commonly known as: ZESTRIL   Lonsurf 20-8.19 MG tablet Generic drug:  trifluridine-tipiracil   ondansetron 8 MG tablet Commonly known as: ZOFRAN       TAKE these medications    amLODipine 2.5 MG tablet Commonly known as: NORVASC Place 1 tablet (2.5 mg total) into feeding tube daily. What changed:  medication strength how much to take how to take this   DULoxetine 20 MG capsule Commonly known as: CYMBALTA Take 1 capsule (20 mg total) by mouth daily.   HYDROcodone-acetaminophen 5-325 MG tablet Commonly known as: NORCO/VICODIN Take 1 tablet by mouth every 6 (six) hours as needed for moderate pain.   Klor-Con M20 20 MEQ tablet Generic drug: potassium chloride SA TAKE 1 TABLET BY MOUTH EVERY DAY What changed:  how much to take when to take this reasons to take this   lactulose  10 GM/15ML solution Commonly known as: CHRONULAC Take 30 mLs (20 g total) by mouth every 4 (four) hours as needed for severe constipation. Reduce dose or stop when you have diarrhea   lidocaine-prilocaine cream Commonly known as: EMLA Apply 1 application. topically as needed. What changed: reasons to take this   LORazepam 0.5 MG tablet Commonly known as: ATIVAN Take 1 tablet (0.5 mg total) by mouth every 8 (eight) hours as needed (Anxiety, Cancer Chemotherapy-Induced Nausea and Vomiting).   pantoprazole 40 MG tablet Commonly known as: PROTONIX TAKE 1 TABLET BY MOUTH EVERY DAY   sucralfate 1 g tablet Commonly known as: CARAFATE TAKE 1 TABLET BY MOUTH 4 TIMES DAILY - WITH MEALS AND AT BEDTIME. What changed: See the new instructions.           TOTAL DISCHARGE TIME: 35 minutes  [employer] on [email or web address]  01/08/2022, 10:26 AM

## 2022-01-09 ENCOUNTER — Telehealth: Payer: Self-pay | Admitting: Hematology

## 2022-01-09 DIAGNOSIS — K56609 Unspecified intestinal obstruction, unspecified as to partial versus complete obstruction: Secondary | ICD-10-CM | POA: Diagnosis not present

## 2022-01-09 LAB — GLUCOSE, CAPILLARY
Glucose-Capillary: 126 mg/dL — ABNORMAL HIGH (ref 70–99)
Glucose-Capillary: 109 mg/dL — ABNORMAL HIGH (ref 70–99)
Glucose-Capillary: 142 mg/dL — ABNORMAL HIGH (ref 70–99)
Glucose-Capillary: 121 mg/dL — ABNORMAL HIGH (ref 70–99)

## 2022-01-09 MED ORDER — HEPARIN SOD (PORK) LOCK FLUSH 100 UNIT/ML IV SOLN
500.0000 [IU] | INTRAVENOUS | Status: DC | PRN
  Filled 2022-01-09: qty 5

## 2022-01-09 MED ORDER — SENNOSIDES-DOCUSATE SODIUM 8.6-50 MG PO TABS
1.0000 | ORAL_TABLET | Freq: Two times a day (BID) | ORAL | 0 refills | Status: AC | PRN
Start: 2022-01-09 — End: ?

## 2022-01-09 NOTE — Telephone Encounter (Signed)
Scheduled follow-up appointments per 5/19 los. Patient is aware. 

## 2022-01-09 NOTE — Progress Notes (Signed)
24 hour chart audit completed 

## 2022-01-09 NOTE — Discharge Summary (Signed)
[facility]  Physician Discharge Summary   Patient ID: Kari Patel MRN: [id number] DOB/AGE: 1951-02-06 71 y.o.  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  Found to have small bowel obstruction.  Was hospitalized for further management.   Consultants: Medical oncology.  General surgery.  Palliative care.   Procedures: Gastrostomy tube placement by interventional radiology on 5/26    HOSPITAL COURSE:   Small bowel obstruction in the setting of colon cancer Obstruction seems to be caused by the cancerous process.  Patient seen by general surgery.  No surgical options are available at this time.   NGT was placed.  Patient was seen by medical oncology as well as palliative care.  Gastrostomy tube was placed for palliative purposes on 5/26.  NG tube was removed at that time.   Started on clear liquids.  Gastrostomy tube was clamped.  Seems to be tolerating the clear liquids well.   Patient has been passing gas.  She did have a bowel movement after she was prescribed Senokot. Patient and family understands what to expect in the setting of the small bowel obstruction caused by her colon cancer.    Nursing staff to educate the patient on how to use the gastrostomy tube for venting purposes.     Metastatic colon cancer Progression of disease noted on CT scan.   Per oncology note does not appear that patient is a candidate for any further chemotherapy.  She appears to be a hospice candidate. Palliative care has met with the patient and family multiple times.  Patient and family agreeable to hospice.  Patient and family will decide on CODE STATUS at later date.   Hypoglycemia Likely due to n.p.o. status.  Required D10 infusion.  Now that she is tolerating clear liquids glucose levels have improved.     Essential hypertension/hypokalemia/hypomagnesemia Blood pressure is stable.  Electrolytes corrected.    Hyponatremia Multifactorial including hypovolemia and malignancy.  Stable.  Normocytic anemia Some bleeding noted in the output from the gastrostomy tube.  Hemoglobin has been stable.   Patient could not leave yesterday since DME was not delivered.  Plan is for delivery of hospital bed and other DME today.  Remains stable for discharge.   PERTINENT LABS:  The results of significant diagnostics from this hospitalization (including imaging, microbiology, ancillary and laboratory) are listed below for reference.      Labs:   Basic Metabolic Panel: Recent Labs  Lab 01/04/22 0855 01/06/22 0939 01/07/22 0648 01/08/22 0430  NA 137 131* 131* 135  K 3.6 3.2* 3.3* 4.2  CL 102 100 98 101  CO2 21* $Remov'24 24 26  'fKjVDw$ GLUCOSE 60* 149* 136* 120*  BUN <5* 8 <5* 5*  CREATININE 0.53 0.44 0.35* 0.41*  CALCIUM 9.1 9.3 9.3 9.2  MG  --   --  1.3* 2.5*    CBC: Recent Labs  Lab 01/04/22 0855 01/06/22 0939 01/07/22 0648 01/08/22 0430  WBC 4.6 3.7* 3.2* 3.4*  HGB 10.5* 10.7* 11.4* 11.6*  HCT 31.1* 32.0* 33.3* 33.6*  MCV 95.4 92.8 92.8 92.8  PLT 196 232 290 249     CBG: Recent Labs  Lab 01/08/22 0030 01/08/22 0559 01/09/22 0143 01/09/22 0552 01/09/22 0728  GLUCAP 120* 108*  126* 109* 142*     IMAGING STUDIES DG Abdomen 1 View  Result Date: 01/01/2022 CLINICAL DATA:  NG tube placement. EXAM: ABDOMEN - 1 VIEW COMPARISON:  CT examination dated Jan 01, 2022 FINDINGS: NG tube with distal tip projecting over the gastric body. Multiple dilated small bowel loops suggesting ileus or obstruction. No radio-opaque calculi or other significant radiographic abnormality are seen. IMPRESSION: 1. NG tube with distal tip projecting over the body of the stomach. 2. Multiple dilated small bowel loops suggesting ileus or obstruction. Electronically Signed   By: Keane Police D.O.   On: 01/01/2022 19:25   CT ABDOMEN PELVIS W CONTRAST  Result Date: 01/01/2022 CLINICAL DATA:  Abdominal pain, nausea, history of colon cancer EXAM: CT ABDOMEN AND PELVIS WITH CONTRAST TECHNIQUE: Multidetector CT imaging of the abdomen and pelvis was performed using the standard protocol following bolus administration of intravenous contrast. RADIATION DOSE REDUCTION: This exam was performed according to the departmental dose-optimization program which includes automated exposure control, adjustment of the mA and/or kV according to patient size and/or use of iterative reconstruction technique. CONTRAST:  69mL OMNIPAQUE IOHEXOL 300 MG/ML  SOLN COMPARISON:  12/08/2021 FINDINGS: Lower chest: Bilateral lower lobe pulmonary nodules are again identified. 6 mm right lower lobe pulmonary nodule image 2/6, and 10 mm left lower lobe pulmonary nodule image 17/6, not appreciably changed since prior study. Findings are consistent with pulmonary metastases. No acute airspace disease, effusion, or pneumothorax. Hepatobiliary: Stable 10 mm hypodense nodule superior aspect right lobe liver image 10/2. Metastatic disease is not excluded. The remainder of the liver is grossly unremarkable. Gallbladder is moderately distended, with minimal sludge or small noncalcified stones layering dependently within the gallbladder lumen. No gallbladder  wall thickening or pericholecystic fluid. Pancreas: Unremarkable. No pancreatic ductal dilatation or surrounding inflammatory changes. Spleen: Normal in size without focal abnormality. Adrenals/Urinary Tract: Adrenal glands are unremarkable. Kidneys are normal, without renal calculi, focal lesion, or hydronephrosis. Bladder is unremarkable. Stomach/Bowel: There is an enlarging ill-defined soft tissue mass within the right lower quadrant at the level of the ileocecal valve, measuring up to 4.7 x 3.6 cm. This results in high-grade small bowel obstruction, with a proximal small bowel measuring up to 3.9 cm in diameter, with multiple gas fluid levels. Mild wall thickening of the distal ileum persists. Mild gas and stool throughout the colon to the level of the rectum. Diffuse distal colonic diverticulosis without evidence of diverticulitis. Vascular/Lymphatic: Aortic atherosclerosis. No discrete pathologic adenopathy within the abdomen or pelvis. Reproductive: Status post hysterectomy. No adnexal masses. Other: Aggressive nodularity within the ventral omentum in the lower abdomen and pelvis again noted, consistent with progressive omental metastatic disease. No free fluid or free intraperitoneal gas. There is a small fat containing umbilical hernia, with omental nodularity contain within the umbilical hernia site. Musculoskeletal: No acute or destructive bony lesions. Reconstructed images demonstrate no additional findings. IMPRESSION: 1. Enlarging soft tissue mass within the right lower quadrant, centered at the level of the ileocecal valve, resulting in high-grade small bowel obstruction. Findings are consistent  with progressive malignancy. 2. Progressive omental nodularity within the lower abdomen and pelvis consistent with worsening metastatic disease. 3. Stable bilateral pulmonary nodules and right lobe hepatic hypodensity, consistent with metastatic disease. 4. Distal colonic diverticulosis without diverticulitis.  5.  Aortic Atherosclerosis (ICD10-I70.0). Electronically Signed   By: Randa Ngo M.D.   On: 01/01/2022 18:10   IR GASTROSTOMY TUBE MOD SED  Result Date: 01/05/2022 INDICATION: Gastric outlet obstruction related to small-bowel obstruction and peritoneal carcinomatosis EXAM: FLUOROSCOPIC 20 FRENCH PULL-THROUGH GASTROSTOMY Date:  01/05/2022 01/05/2022 4:47 pm Radiologist:  Jerilynn Mages. Daryll Brod, MD Guidance:  Kerby Less MEDICATIONS: 2 G ANCEF; Antibiotics were administered within 1 hour of the procedure. GLUCAGON 0.5 MG IV ANESTHESIA/SEDATION: Versed 1.0 mg IV; Fentanyl 50 mcg IV Moderate Sedation Time:  16 minutes The patient was continuously monitored during the procedure by the interventional radiology nurse under my direct supervision. CONTRAST:  66mL OMNIPAQUE IOHEXOL 300 MG/ML SOLN - administered into the gastric lumen. FLUOROSCOPY: Fluoroscopy Time: 1 minutes 42 seconds (3.0 mGy). COMPLICATIONS: None immediate. PROCEDURE: Informed consent was obtained from the patient following explanation of the procedure, risks, benefits and alternatives. The patient understands, agrees and consents for the procedure. All questions were addressed. A time out was performed. Maximal barrier sterile technique utilized including caps, mask, sterile gowns, sterile gloves, large sterile drape, hand hygiene, and betadine prep. The left upper quadrant was sterilely prepped and draped. An oral gastric catheter was inserted into the stomach under fluoroscopy. The existing nasogastric feeding tube was removed. Air was injected into the stomach for insufflation and visualization under fluoroscopy. The air distended stomach was confirmed beneath the anterior abdominal wall in the frontal and lateral projections. Under sterile conditions and local anesthesia, a 45 gauge trocar needle was utilized to access the stomach percutaneously beneath the left subcostal margin. Needle position was confirmed within the stomach under biplane  fluoroscopy. Contrast injection confirmed position also. A single T tack was deployed for gastropexy. Over an Amplatz guide wire, a 9-French sheath was inserted into the stomach. A snare device was utilized to capture the oral gastric catheter. The snare device was pulled retrograde from the stomach up the esophagus and out the oropharynx. The 20-French pull-through gastrostomy was connected to the snare device and pulled antegrade through the oropharynx down the esophagus into the stomach and then through the percutaneous tract external to the patient. The gastrostomy was assembled externally. Contrast injection confirms position in the stomach. Images were obtained for documentation. The patient tolerated procedure well. No immediate complication. IMPRESSION: Fluoroscopic insertion of a 20-French "pull-through" gastrostomy. Electronically Signed   By: Jerilynn Mages.  Shick M.D.   On: 01/05/2022 16:56    DISCHARGE EXAMINATION: Vitals:   01/08/22 0557 01/08/22 1437 01/08/22 2106 01/09/22 0556  BP: 133/70 138/78 (!) 139/92 126/81  Pulse: 81 78 87 (!) 105  Resp: $Remo'18 16 17 18  'enElX$ Temp: 97.9 F (36.6 C) 97.9 F (36.6 C) 98.8 F (37.1 C) 99.1 F (37.3 C)  TempSrc: Oral Oral Oral Oral  SpO2: 100% 100% 99% 95%  Weight:      Height:       General appearance: Awake alert.  In no distress Resp: Clear to auscultation bilaterally.  Normal effort Cardio: S1-S2 is normal regular.  No S3-S4.  No rubs murmurs or bruit    DISPOSITION: Home with son  Discharge Instructions     Call MD for:  difficulty breathing, headache or visual disturbances   Complete by: As directed    Call MD for:  extreme fatigue   Complete by: As directed    Call MD for:  persistant dizziness or light-headedness   Complete by: As directed    Call MD for:  persistant nausea and vomiting   Complete by: As directed    Call MD for:  severe uncontrolled pain   Complete by: As directed    Call MD for:  temperature >100.4   Complete by: As  directed    Diet clear liquid   Complete by: As directed    Discharge instructions   Complete by: As directed    Please take your medications as prescribed.  Stay on liquid diet for now.  Stay on clear liquids for now and you may try broth in a few days.  If you feel bloated or if your abdomen feels distended or if you feel nauseated then the tube in your abdomen will need to be opened up to decompress your bowels.  Nurse will provide education on how to do this.    You were cared for by a hospitalist during your hospital stay. If you have any questions about your discharge medications or the care you received while you were in the hospital after you are discharged, you can call the unit and asked to speak with the hospitalist on call if the hospitalist that took care of you is not available. Once you are discharged, your primary care physician will handle any further medical issues. Please note that NO REFILLS for any discharge medications will be authorized once you are discharged, as it is imperative that you return to your primary care physician (or establish a relationship with a primary care physician if you do not have one) for your aftercare needs so that they can reassess your need for medications and monitor your lab values. If you do not have a primary care physician, you can call [phone] for a physician referral.   Increase activity slowly   Complete by: As directed          Allergies as of 01/09/2022       Reactions   Demerol [meperidine Hcl] Nausea Only        Medication List     STOP taking these medications    lisinopril 5 MG tablet Commonly known as: ZESTRIL   Lonsurf 20-8.19 MG tablet Generic drug: trifluridine-tipiracil   ondansetron 8 MG tablet Commonly known as: ZOFRAN       TAKE these medications    amLODipine 2.5 MG tablet Commonly known as: NORVASC Place 1 tablet (2.5 mg total) into feeding tube daily. What changed:  medication strength how  much to take how to take this   DULoxetine 20 MG capsule Commonly known as: CYMBALTA Take 1 capsule (20 mg total) by mouth daily.   HYDROcodone-acetaminophen 5-325 MG tablet Commonly known as: NORCO/VICODIN Take 1 tablet by mouth every 6 (six) hours as needed for moderate pain.   Klor-Con M20 20 MEQ tablet Generic drug: potassium chloride SA TAKE 1 TABLET BY MOUTH EVERY DAY What changed:  how much to take when to take this reasons to take this   lactulose 10 GM/15ML solution Commonly known as: CHRONULAC Take 30 mLs (20 g total) by mouth every 4 (four) hours as needed for severe constipation. Reduce dose or stop when you have diarrhea   lidocaine-prilocaine cream Commonly known as: EMLA Apply 1 application. topically as needed. What changed: reasons to take this   LORazepam 0.5 MG tablet Commonly known as: ATIVAN Take 1 tablet (  0.5 mg total) by mouth every 8 (eight) hours as needed (Anxiety, Cancer Chemotherapy-Induced Nausea and Vomiting).   pantoprazole 40 MG tablet Commonly known as: PROTONIX TAKE 1 TABLET BY MOUTH EVERY DAY   senna-docusate 8.6-50 MG tablet Commonly known as: Senokot-S Take 1 tablet by mouth 2 (two) times daily as needed for mild constipation.   sucralfate 1 g tablet Commonly known as: CARAFATE TAKE 1 TABLET BY MOUTH 4 TIMES DAILY - WITH MEALS AND AT BEDTIME. What changed: See the new instructions.           Follow-up Information     [facility] Follow up.   Specialty: Hospice and Palliative Medicine Why: [facility] will be providing home hospices services after discharge. Contact information: [place] [address] [phone]                TOTAL DISCHARGE TIME: 35 minutes  [facility] Pager on [email or web address]  01/09/2022, 11:02 AM

## 2022-01-09 NOTE — Progress Notes (Signed)
5/30 Pt has rcv'd initial IM Letter, I spoke to patient via telephone and explained the IM Letter again. IM Letter will be mailed to son's address in which was provided by patient as patient will be residing there temporarily during recovery. Son - Ed [facility] [address]  [address], [place] [address].

## 2022-01-09 NOTE — Care Management Important Message (Signed)
Important Message  Patient Details  Name: Kari Patel MRN: [id number] Date of Birth: 08/02/1951   Medicare Important Message Given:  Yes     Hannah Beat 01/09/2022, 1:18 PM

## 2022-01-16 ENCOUNTER — Telehealth: Payer: Self-pay | Admitting: *Deleted

## 2022-01-16 ENCOUNTER — Other Ambulatory Visit: Payer: Self-pay | Admitting: Hematology

## 2022-01-16 ENCOUNTER — Other Ambulatory Visit: Payer: Self-pay

## 2022-01-16 NOTE — Telephone Encounter (Signed)
"  Briell Paulette [phone]), to speak with someone about needed FMLA forms.  Have not worked since mom Omnia Dollinger was hospitalized 01/01/2022."  Provided forms e-mail address [email or web address].  Reviewed ROI required to process forms.  Ask to allow 7-10 business days/14- calendar to process.  ROI e-mailed during call to [email or web address].   This nurse ended call advising I will follow up tomorrow with status of receipt of FMLA form.   ROI e-mailed during call.

## 2022-01-17 NOTE — Telephone Encounter (Addendum)
[id number]: Connected with [name] son, ED Doug Sou [phone]).  FMLA form not yet received.  Tried to re-send twice during call.  Still not received.  Provided office fax number [phone] or delivery to main entrance.    "Will have someone else send this.  Look for Terisa Starr.  Confirmed receipt of ROI."  09:31 Notified Ed Doug Sou of receipt of form from [place].  Advised we are now awaiting signed release.

## 2022-01-18 NOTE — Telephone Encounter (Signed)
This nurse further examined form received.  Notified Kari Patel this is the [phone] Eligibility Notice or leave request application for employees to complete and return to their employer review determination.  Advised to return to employer to receive correct work accommodation form.

## 2022-01-18 NOTE — Telephone Encounter (Signed)
"  [name] calling to confirm receipt of authorization form because you were .  You were having trouble receiving e-mails from me.  Can we expedite return to [employer], this is just a formality that will be denied.  Have not worked for them a year but FMLA form has to be completed before an accommodation can be started."    Confirmed receipt of signed release.  Asked date form is due.  Request accommodation form.  We ask 7 - 10 business (14 calender) from receipt date of form staff.  Forms are completed in order received.    Will ask collaborative assistance.

## 2022-01-19 NOTE — Telephone Encounter (Signed)
Kari Patel called 01/19/2022 at 1641 to check receipt of e-mail sent to forms staff. E-mail folder checked during call.  Viewed receipt of [employer]-[id number] F leave request.  Confirmed receipt however requested verification of FMLA qualifications.  Previously stated he has "not yet completed first year employment with [employer]".   Out of work since [employer] recent date of hospitalization.  Asked end date of leave or projected return to work date.   "I am unable to return to work caring for my mom.  I wish I knew."   Denies further questions or needs.  Form in queue to complete per protocol.

## 2022-01-23 ENCOUNTER — Inpatient Hospital Stay: Payer: Medicare HMO

## 2022-01-23 ENCOUNTER — Telehealth: Payer: Self-pay

## 2022-01-23 ENCOUNTER — Inpatient Hospital Stay: Payer: Medicare HMO | Admitting: Hematology

## 2022-01-23 ENCOUNTER — Other Ambulatory Visit: Payer: Self-pay

## 2022-01-23 ENCOUNTER — Inpatient Hospital Stay: Payer: Medicare HMO | Admitting: Nutrition

## 2022-01-23 DIAGNOSIS — C182 Malignant neoplasm of ascending colon: Secondary | ICD-10-CM

## 2022-01-23 MED ORDER — POTASSIUM CHLORIDE CRYS ER 20 MEQ PO TBCR: 20.0000 meq | EXTENDED_RELEASE_TABLET | Freq: Every day | ORAL | 1 refills | Status: AC

## 2022-01-23 NOTE — Telephone Encounter (Signed)
01/23/2022 "Fransisco Beau [phone]).  Hopefully not bothering you but calling to let you know I spoke with Dr. Ernestina Penna nurse.  She will complete my FMLA form today.  I did not get paid so this needs to get done.  Thanks."

## 2022-01-23 NOTE — Telephone Encounter (Signed)
This nurse reached out to patients son per providers request to see how the patient is doing.  Son states that she was doing well up until this past weekend.  She has not been able to eat anything in the past 3 days.  She has been eating ice and Popsicles. He states that she was advised to take Ativan and Compazine together to combat the nausea however it is not helping and she is still vomiting.  Son also states that patient needs a refill on her Potassium and the patient is also wanting to know if Frankey Poot is still an option for her.  The son states that they are still not sure if hospice is the best option at this time.  This nurse advise that this information will be forwarded to the provider for advisement.

## 2022-01-23 NOTE — Telephone Encounter (Signed)
FMLA paperwork completed at this time.  Yellow form folder to designated mail bin for collaborative pick up.  Awaiting provider review and signature.

## 2022-01-23 NOTE — Telephone Encounter (Signed)
WH-380-F leave form successfully faxed to [employer] at this time.  Will reach out to determine if original copy to be picked up or mailed to [employer] son Kari Patel.  Currently, no further completed form delivery actions performed.

## 2022-01-24 ENCOUNTER — Telehealth: Payer: Self-pay

## 2022-01-24 ENCOUNTER — Encounter: Payer: Self-pay | Admitting: Hematology

## 2022-01-24 NOTE — Telephone Encounter (Signed)
"  Thank you for all you do.  Received confirmation call today from [place] is living with me.  Return copy to my address.   [place], [place]., [address] "

## 2022-01-24 NOTE — Telephone Encounter (Signed)
[facility] Nurse with [employer] [facility] Team) called today stating that on her home visit today with the pt and pt's son, they both have lots of questions regarding pt's current state.  Mendel Ryder indicated that the pt is inquiring about Lonsurf (oral chemotherapy) and stating she is weak and feel that she should go to the hospital to get a blood transfusion.  Mendel Ryder educated pt and son that the blood transfusion will not make the pt feel much better d/t the pt is actively dying.  Mendel Ryder stated the pt and son said that [name] was supposed to call them yesterday but they did not get a call; therefore, Mendel Ryder reached out to Dr. Ernestina Penna office for [name] to please contact the pt and her son.  Mendel Ryder stated the pt is extremely weak and based on her judgement may live another 1 or 2 wks.  Lorenso Quarry for contacting Dr. Ernestina Penna office regarding the pt's and family's questions.  Sent a Patient Call message to [name] to ask her to speak with the pt and her son regarding pt's health.

## 2022-01-25 ENCOUNTER — Telehealth: Payer: Self-pay

## 2022-01-25 NOTE — Telephone Encounter (Signed)
Spoke with [facility] RN with [employer] [phone]) regarding adding on IVF PRN for pt.  Pt and son spoke with [name] yesterday evening and pt requesting if she could continue to get IVF PRN since she's not really eating or drinking.  Gave verbal order to Mendel Ryder but she is going to check with her infusion coordinator to see what is needed to give the PRN IVFs.  Mendel Ryder stated she will return this RN's call and fax over order/s for [name] to sign.

## 2022-01-30 ENCOUNTER — Other Ambulatory Visit (HOSPITAL_COMMUNITY): Payer: Self-pay

## 2022-02-05 ENCOUNTER — Inpatient Hospital Stay: Payer: Medicare HMO | Admitting: Hematology

## 2022-02-05 ENCOUNTER — Inpatient Hospital Stay: Payer: Medicare HMO

## 2022-02-05 DIAGNOSIS — C182 Malignant neoplasm of ascending colon: Secondary | ICD-10-CM

## 2022-02-06 ENCOUNTER — Other Ambulatory Visit: Payer: Self-pay

## 2022-02-10 DEATH — deceased

## 2022-03-30 ENCOUNTER — Other Ambulatory Visit: Payer: Self-pay | Admitting: Hematology

## 2023-03-19 IMAGING — CT CT CHEST-ABD-PELV W/ CM
2 of 5 series · 13 of 36 positions shown, 15 images · IV contrast (OMNIPAQUE)
Comparison: Outside CT December 22, 2020.

CLINICAL DATA: Colorectal cancer staging, diagnosed December 2020

EXAM:
CT CHEST, ABDOMEN, AND PELVIS WITH CONTRAST
TECHNIQUE: Multidetector CT imaging of the chest, abdomen and pelvis was
performed following the standard protocol during bolus
administration of intravenous contrast.
CONTRAST:  75mL OMNIPAQUE IOHEXOL 350 MG/ML SOLN

[Series 2: cap with · axial · 0.80mm/px · z∈[+1199,+1694]mm · 10 of 123 slices shown, 12 images]
[im 12/123  mediastinal]
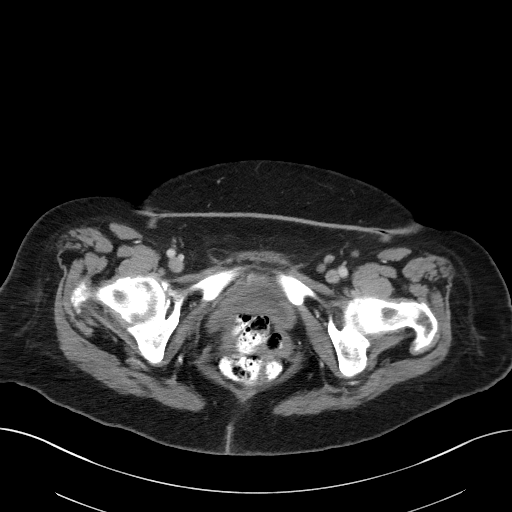
[im 12/123  bone]
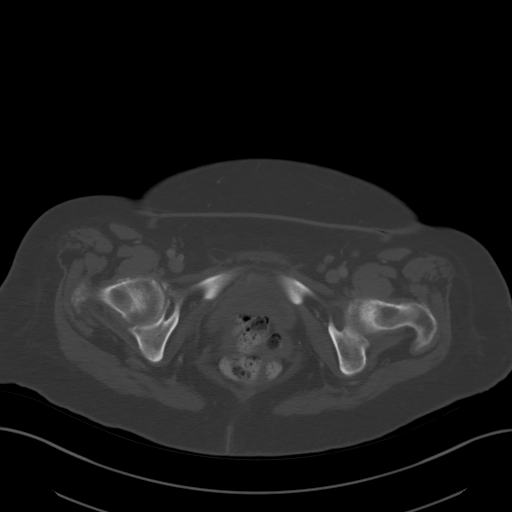
[im 23/123  mediastinal]
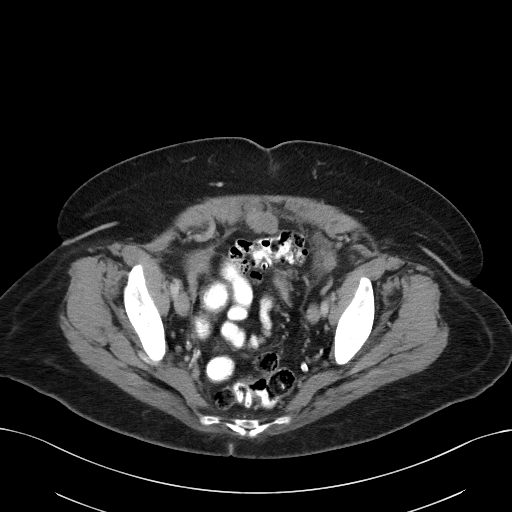
[im 34/123  mediastinal]
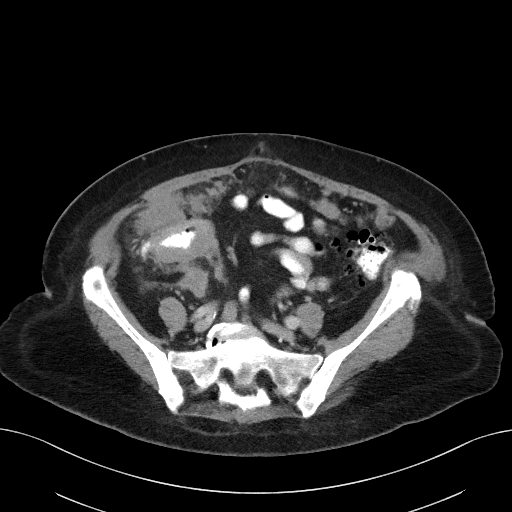
[im 45/123  mediastinal]
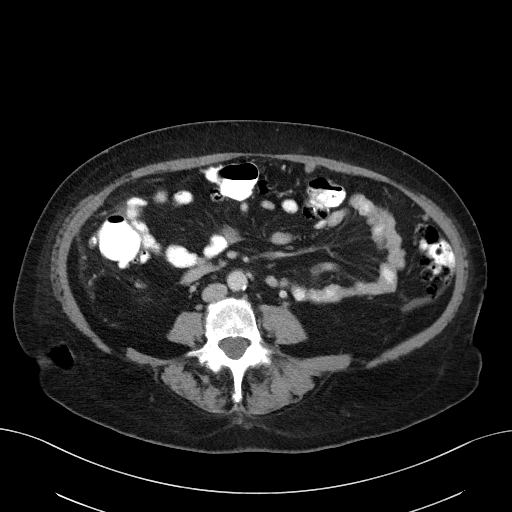
[im 56/123  mediastinal]
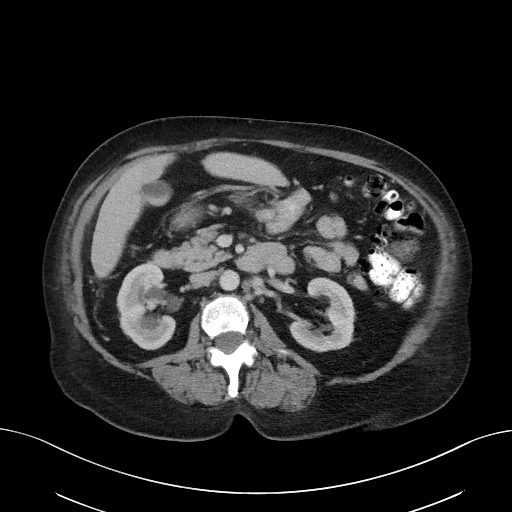
[im 67/123  mediastinal]
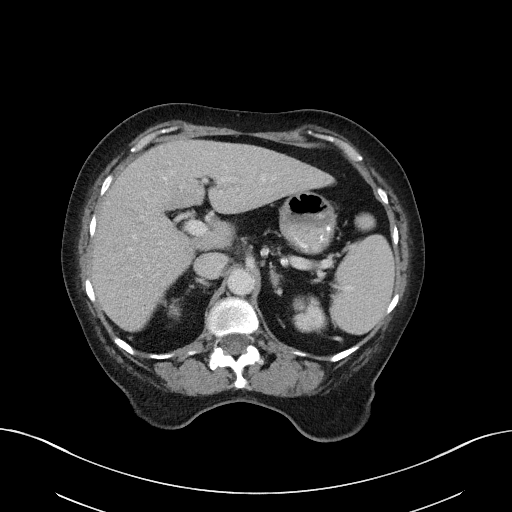
[im 78/123  mediastinal]
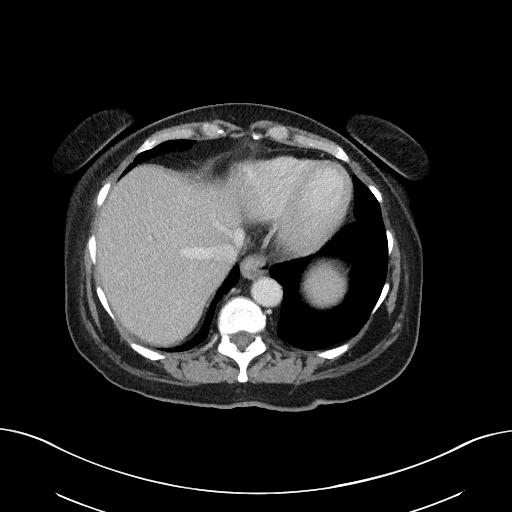
[im 89/123  mediastinal]
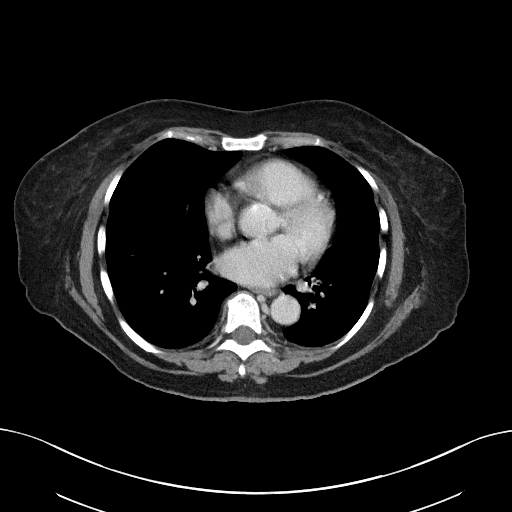
[im 100/123  mediastinal]
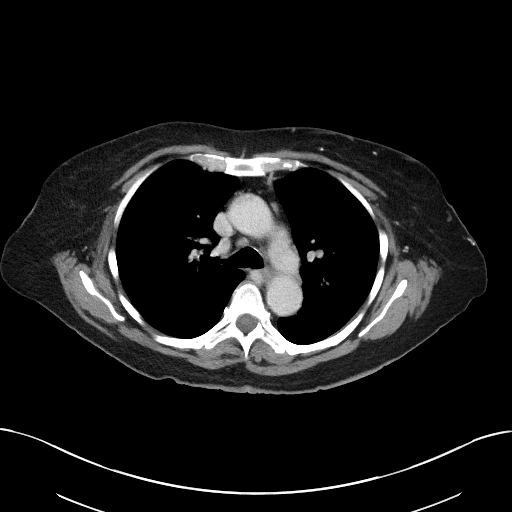
[im 100/123  bone]
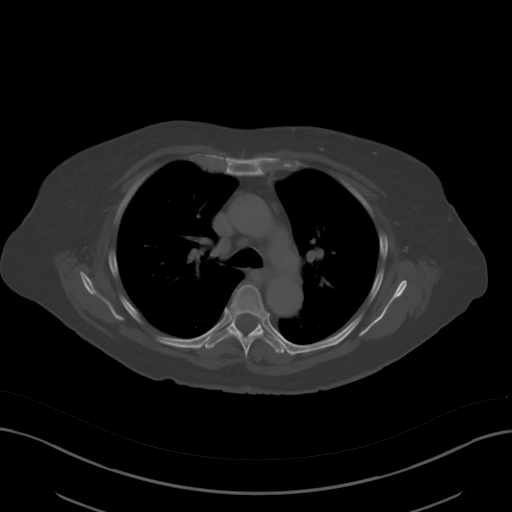
[im 111/123  mediastinal]
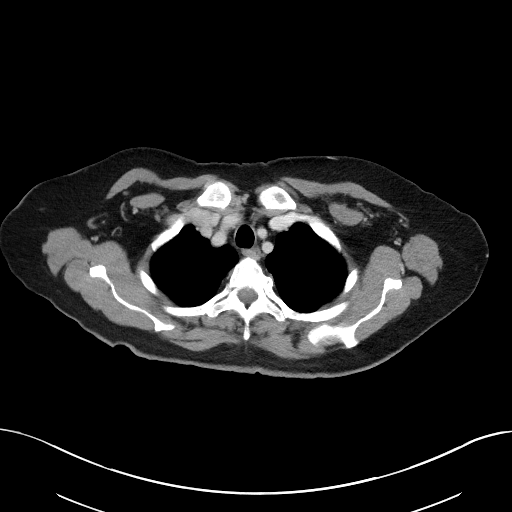

[Series 4: coronals · coronal · 0.82mm/px · 3 of 137 slices shown]
[im 28/137  mediastinal]
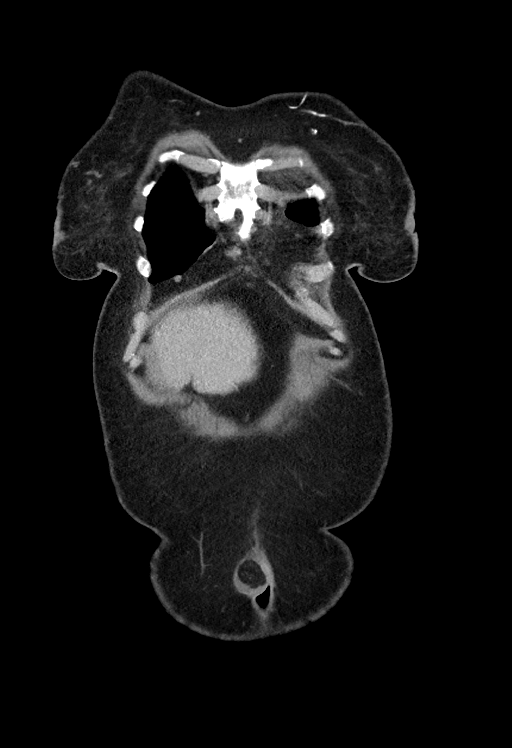
[im 55/137  mediastinal]
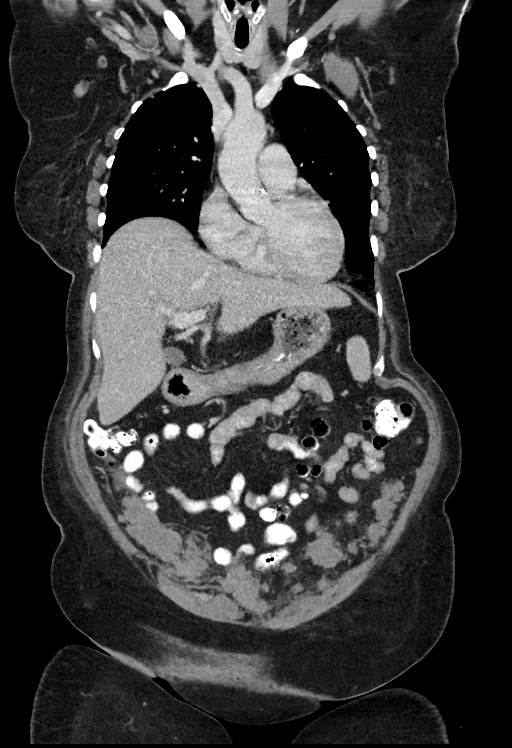
[im 82/137  mediastinal]
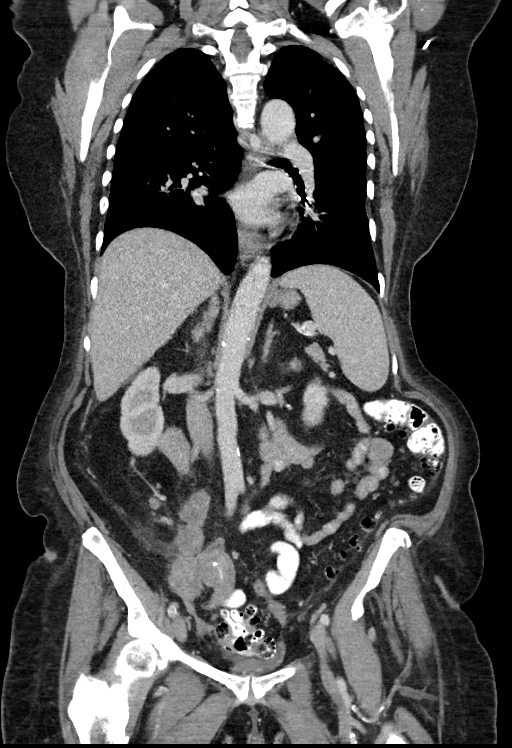

[13 of 36 positions shown; findings below may reference images not displayed]

FINDINGS: CT CHEST FINDINGS

Cardiovascular: Aortic atherosclerosis without aneurysmal dilation.
No central pulmonary embolus. Normal size heart. Coronary artery
calcifications. No significant pericardial effusion/thickening.

Mediastinum/Nodes: No supraclavicular adenopathy. Hypodense 1 cm
nodule in the left lobe of thyroid. Not clinically significant; no
follow-up imaging recommended (ref: [HOSPITAL]. [DATE]): 143-50).Prominent mediastinal and axillary lymph nodes
without adenopathy by size criteria. The trachea and esophagus are
grossly unremarkable. Small hiatal hernia.

Lungs/Pleura: No significant interval change in size or number of
the numerous scattered bilateral subcentimeter pulmonary nodules.
For reference:

-solid 6 mm pulmonary nodule in the right apex on image [DATE],
unchanged.

-solid 5 mm pulmonary nodule in the paramedian anterior right upper
lobe on image 66/6, unchanged

-solid 5 mm pulmonary nodule in the right lower lobe on image 76/6,
unchanged.

-solid 6 mm left lower lobe pulmonary nodule on image 96/6,
previously measuring 5 mm.

No pleural effusion.  No pneumothorax.

Musculoskeletal: Spondylosis. No aggressive lytic or blastic lesion
of bone.

CT ABDOMEN PELVIS FINDINGS

Hepatobiliary: Borderline hepatomegaly measuring 17.4 cm in
craniocaudal dimension. The no suspicious hepatic lesion. Layering
biliary sludge and stones in the gallbladder, no evidence of acute
cholecystitis. No biliary ductal dilation.

Pancreas: Within normal limits.

Spleen: Within normal limits.

Adrenals/Urinary Tract: Bilateral adrenal glands are unremarkable.

Similar mild right-sided hydronephrosis without hydroureter, with
the distal ureter traversing peritoneal nodularity on image 92/2. No
left-sided hydronephrosis. No solid enhancing renal masses.

Urinary bladder is decompressed limiting evaluation.

Stomach/Bowel: Small hiatal hernia, otherwise the stomach is grossly
unremarkable. No pathologic dilation of small bowel. Extensive
colonic diverticulosis without findings of acute diverticulitis.

Large infiltrative cecal mass measuring approximately 7.1 x 6.8 x
4.3 cm on image 96/2 and 71/4, with luminal narrowing and
involvement of the terminal ileum.

Vascular/Lymphatic: Aortic atherosclerosis without aneurysmal
dilation.

Similar prominent and mildly enlarged retroperitoneal and mesenteric
lymph nodes. For reference right common iliac lymph node measuring
12 mm in short axis on image 91/2 and a right pelvic sidewall lymph
node measuring 7 mm on image 98/2.

Reproductive: Status post hysterectomy. No adnexal masses.

Other: Similar extensive peritoneal and omental nodularity
throughout the abdomen and pelvis with trace free fluid in the
pelvis along the pericolic gutters.

Musculoskeletal: Multilevel degenerative changes spine. Degenerative
change of the right greater than left hips. No aggressive lytic or
blastic lesion of bone visualized.
IMPRESSION: 1. Large infiltrative cecal mass measuring approximately 7.1 cm,
reflecting the known right-sided colonic neoplasm.
2. Similar extensive peritoneal/omental nodularity throughout the
abdomen and pelvis with trace free fluid in the pelvis along the
pericolic gutters, consistent with disease involvement.
3. Similar prominent and mildly enlarged retroperitoneal and
mesenteric lymph nodes, most likely reflecting disease involvement.
4. Numerous scattered bilateral pulmonary nodules measuring up to 6
mm are similar to prior and nonspecific but suspicious for disease
involvement. Attention on follow-up imaging suggested.
5. Similar mild right-sided hydronephrosis without hydroureter, with
the distal ureter traversing peritoneal nodularity. Likely
reflecting partial UPJ obstruction given lack of hydroureter.
Attention on follow-up imaging suggested. Cholelithiasis without
findings of acute cholecystitis.
6.  Aortic Atherosclerosis ([id number]).

## 2023-03-22 IMAGING — CT CT BIOPSY
1 of 3 series · 14 of 32 positions shown, 19 images · non-contrast
Comparison: none

INDICATION: 69-year-old female with a history colon cancer and omental
metastases. She has been referred for image guided biopsy for
diagnosis

[Series 2: i-spiral 5.0 b40f · axial · 0.91mm/px · z∈[+852,+971]mm · 14 of 40 slices shown, 19 images]
[im 3/40  soft-tissue]
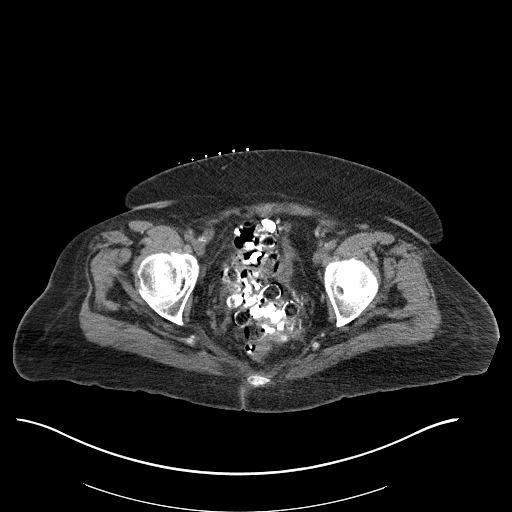
[im 3/40  bone]
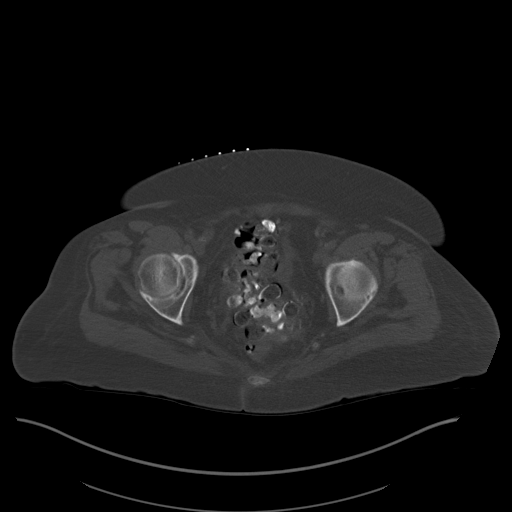
[im 5/40  soft-tissue]
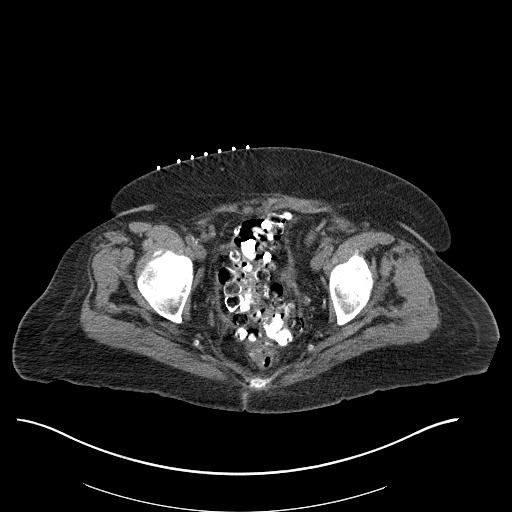
[im 10/40  soft-tissue]
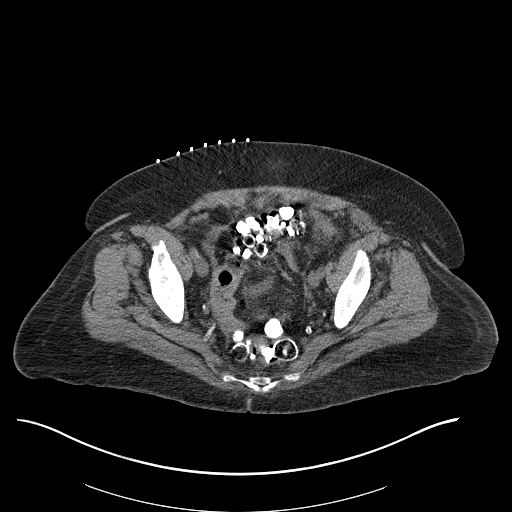
[im 12/40  soft-tissue]
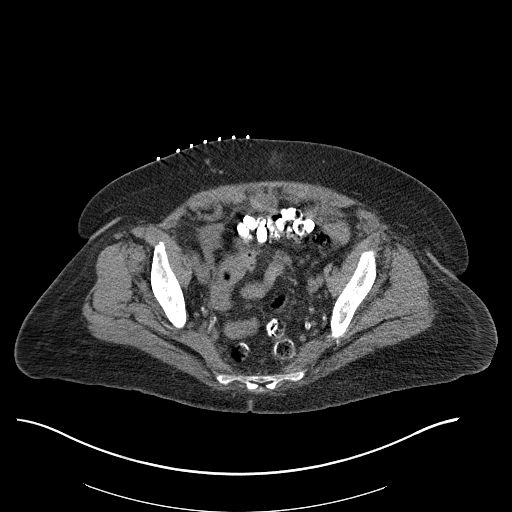
[im 14/40  soft-tissue]
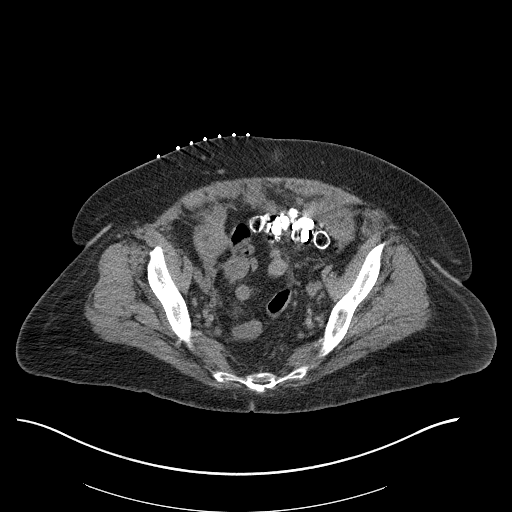
[im 17/40  soft-tissue]
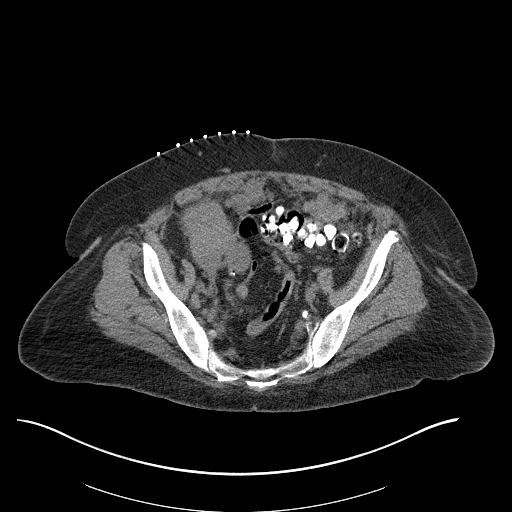
[im 21/40  soft-tissue]
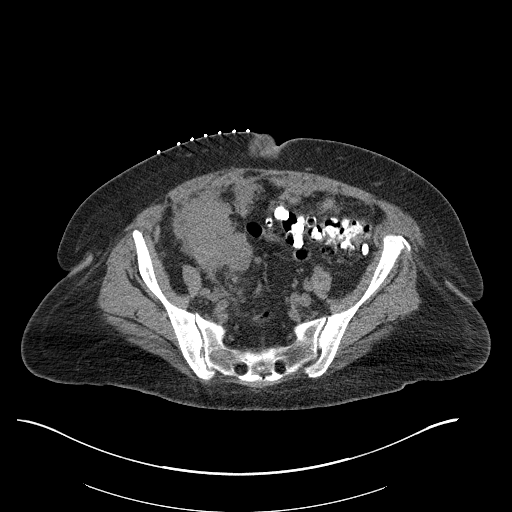
[im 23/40  soft-tissue]
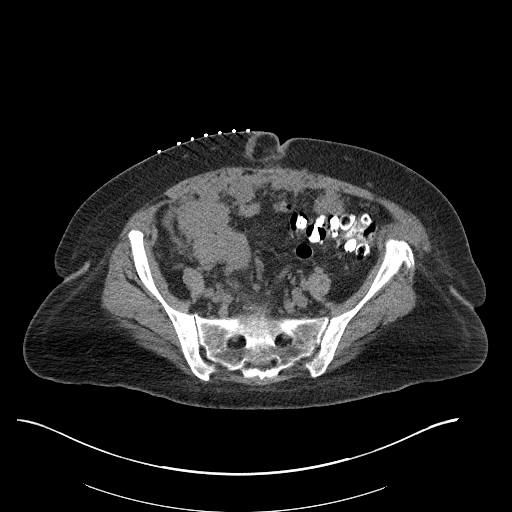
[im 26/40  soft-tissue]
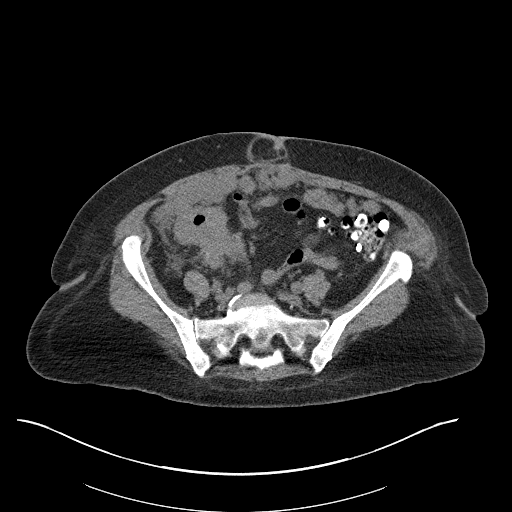
[im 26/40  bone]
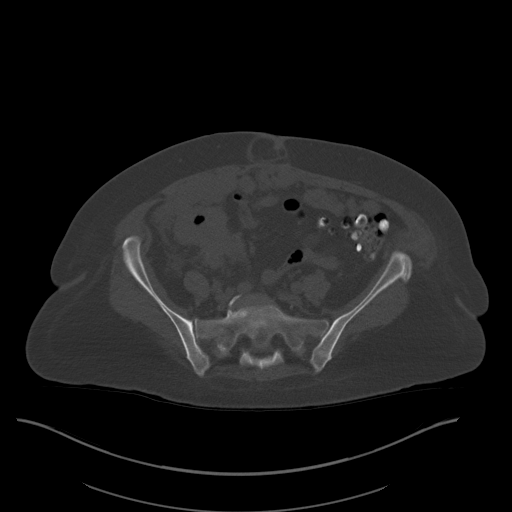
[im 28/40  soft-tissue]
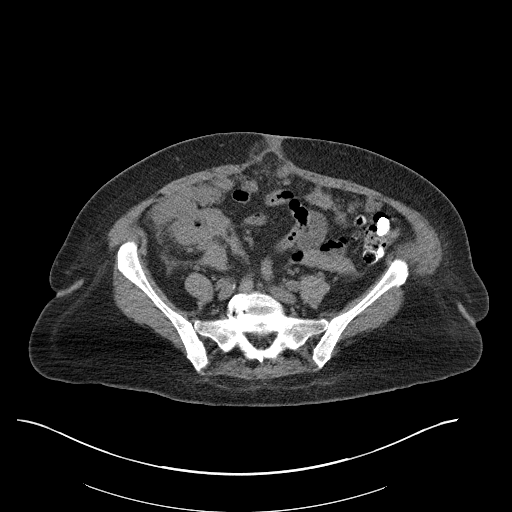
[im 30/40  soft-tissue]
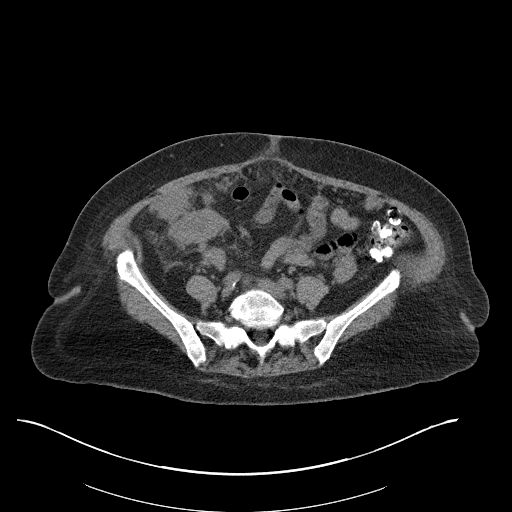
[im 30/40  lung]
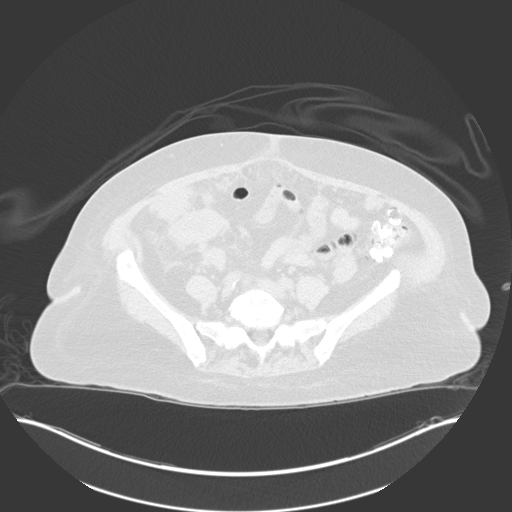
[im 33/40  lung]
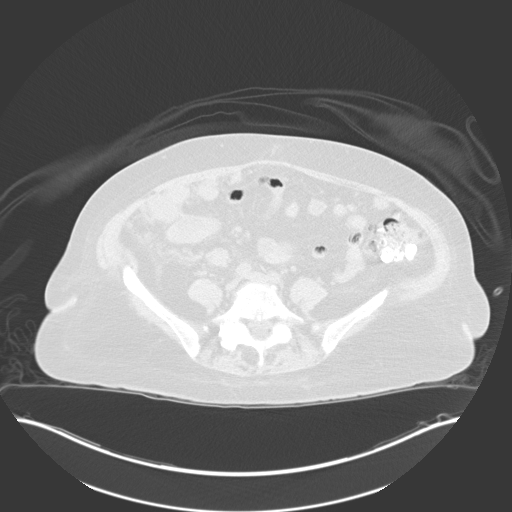
[im 35/40  soft-tissue]
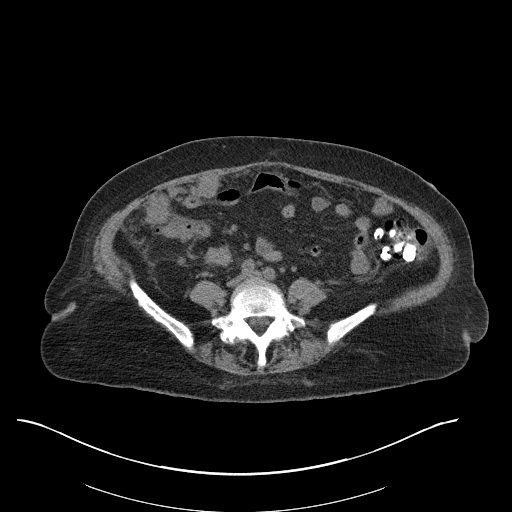
[im 35/40  lung]
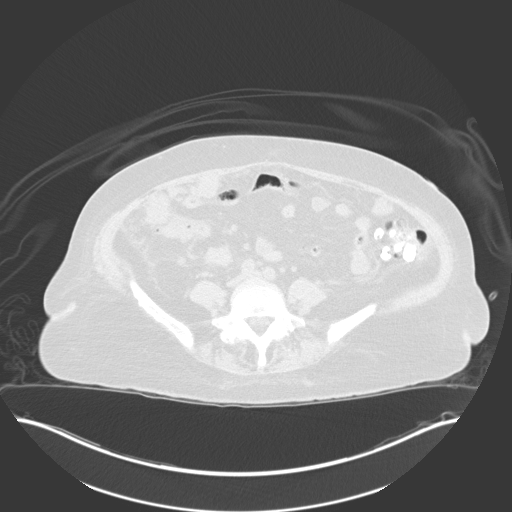
[im 37/40  soft-tissue]
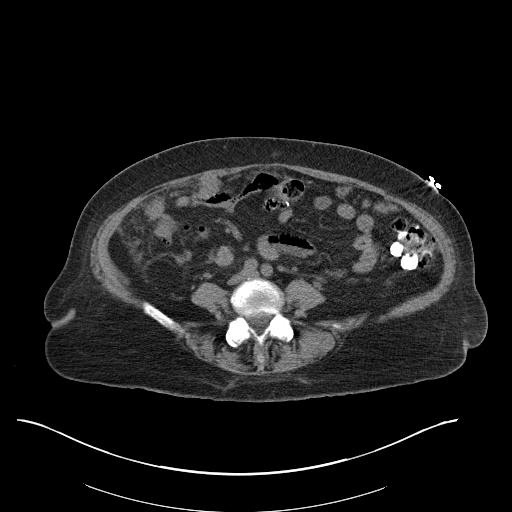
[im 37/40  lung]
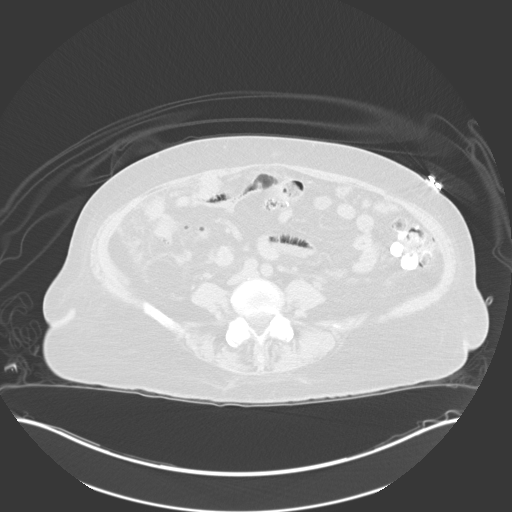

[14 of 32 positions shown; findings below may reference images not displayed]

EXAM:
CT BIOPSY

MEDICATIONS:
None.

ANESTHESIA/SEDATION:
Moderate (conscious) sedation was employed during this procedure. A
total of Versed 0.5 mg and Fentanyl 25 mcg was administered
intravenously.

Moderate Sedation Time: 11 minutes. The patient's level of
consciousness and vital signs were monitored continuously by
radiology nursing throughout the procedure under my direct
supervision.

FLUOROSCOPY TIME:  None

COMPLICATIONS:
None

PROCEDURE:
Informed written consent was obtained from the patient after a
thorough discussion of the procedural risks, benefits and
alternatives. All questions were addressed. Maximal Sterile Barrier
Technique was utilized including caps, mask, sterile gowns, sterile
gloves, sterile drape, hand hygiene and skin antiseptic. A timeout
was performed prior to the initiation of the procedure.

Patient was positioned supine position on CT gantry table. Scout CT
was acquired for planning purposes.

The patient was then prepped and draped in the usual sterile
fashion. 1% lidocaine was used for local anesthesia. Using CT
guidance, guide needle was advanced into the soft tissue mass of the
superficial omentum of the right lower quadrant. Once we confirmed
needle tip position multiple 18 gauge core biopsy were acquired.
Needle was removed and a final image was stored.

Patient tolerated the procedure well and remained hemodynamically
stable throughout.

No complications were encountered and no significant blood loss.
IMPRESSION: Status post CT-guided biopsy of right lower quadrant omental mass.
